# Patient Record
Sex: Male | Born: 1935 | Race: White | Hispanic: No | Marital: Married | Smoking: Former smoker
Health system: Southern US, Community
[De-identification: ages and names within clinical notes are randomized; demographics above are authoritative.]

## PROBLEM LIST (undated history)

## (undated) DIAGNOSIS — I4891 Unspecified atrial fibrillation: Secondary | ICD-10-CM

## (undated) DIAGNOSIS — J449 Chronic obstructive pulmonary disease, unspecified: Secondary | ICD-10-CM

## (undated) DIAGNOSIS — I509 Heart failure, unspecified: Secondary | ICD-10-CM

## (undated) DIAGNOSIS — I714 Abdominal aortic aneurysm, without rupture: Secondary | ICD-10-CM

## (undated) DIAGNOSIS — N400 Enlarged prostate without lower urinary tract symptoms: Secondary | ICD-10-CM

## (undated) DIAGNOSIS — M25551 Pain in right hip: Secondary | ICD-10-CM

## (undated) DIAGNOSIS — I10 Essential (primary) hypertension: Secondary | ICD-10-CM

## (undated) DIAGNOSIS — I219 Acute myocardial infarction, unspecified: Secondary | ICD-10-CM

## (undated) DIAGNOSIS — G8929 Other chronic pain: Secondary | ICD-10-CM

## (undated) DIAGNOSIS — Z7901 Long term (current) use of anticoagulants: Secondary | ICD-10-CM

## (undated) DIAGNOSIS — C801 Malignant (primary) neoplasm, unspecified: Secondary | ICD-10-CM

## (undated) DIAGNOSIS — E785 Hyperlipidemia, unspecified: Secondary | ICD-10-CM

## (undated) HISTORY — PX: ABDOMINAL AORTIC ANEURYSM REPAIR: SUR1152

## (undated) HISTORY — DX: Essential (primary) hypertension: I10

## (undated) HISTORY — DX: Malignant (primary) neoplasm, unspecified: C80.1

## (undated) HISTORY — DX: Long term (current) use of anticoagulants: Z79.01

## (undated) HISTORY — DX: Abdominal aortic aneurysm, without rupture: I71.4

## (undated) HISTORY — DX: Hyperlipidemia, unspecified: E78.5

## (undated) HISTORY — DX: Unspecified atrial fibrillation: I48.91

## (undated) HISTORY — PX: EYE SURGERY: SHX253

## (undated) HISTORY — DX: Acute myocardial infarction, unspecified: I21.9

## (undated) HISTORY — DX: Benign prostatic hyperplasia without lower urinary tract symptoms: N40.0

---
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
## 1991-05-26 DIAGNOSIS — I219 Acute myocardial infarction, unspecified: Secondary | ICD-10-CM

## 1991-05-26 HISTORY — DX: Acute myocardial infarction, unspecified: I21.9

## 2001-10-05 ENCOUNTER — Encounter: Admission: RE | Admit: 2001-10-05 | Discharge: 2001-10-05 | Payer: Self-pay | Admitting: Internal Medicine

## 2001-10-05 ENCOUNTER — Encounter: Payer: Self-pay | Admitting: Internal Medicine

## 2001-10-12 ENCOUNTER — Encounter: Payer: Self-pay | Admitting: *Deleted

## 2001-10-12 ENCOUNTER — Encounter: Admission: RE | Admit: 2001-10-12 | Discharge: 2001-10-12 | Payer: Self-pay | Admitting: *Deleted

## 2003-12-05 ENCOUNTER — Encounter: Admission: RE | Admit: 2003-12-05 | Discharge: 2003-12-05 | Payer: Self-pay | Admitting: Vascular Surgery

## 2004-07-02 ENCOUNTER — Encounter: Admission: RE | Admit: 2004-07-02 | Discharge: 2004-07-02 | Payer: Self-pay | Admitting: Vascular Surgery

## 2004-12-31 ENCOUNTER — Encounter: Admission: RE | Admit: 2004-12-31 | Discharge: 2004-12-31 | Payer: Self-pay | Admitting: Vascular Surgery

## 2005-07-15 ENCOUNTER — Encounter: Admission: RE | Admit: 2005-07-15 | Discharge: 2005-07-15 | Payer: Self-pay | Admitting: Vascular Surgery

## 2006-01-13 ENCOUNTER — Encounter: Admission: RE | Admit: 2006-01-13 | Discharge: 2006-01-13 | Payer: Self-pay | Admitting: Vascular Surgery

## 2006-07-07 ENCOUNTER — Encounter (INDEPENDENT_AMBULATORY_CARE_PROVIDER_SITE_OTHER): Payer: Self-pay | Admitting: Specialist

## 2006-07-07 ENCOUNTER — Ambulatory Visit (HOSPITAL_COMMUNITY): Admission: RE | Admit: 2006-07-07 | Discharge: 2006-07-07 | Payer: Self-pay | Admitting: General Surgery

## 2006-07-28 ENCOUNTER — Ambulatory Visit: Payer: Self-pay | Admitting: Vascular Surgery

## 2007-02-08 ENCOUNTER — Ambulatory Visit: Payer: Self-pay | Admitting: Vascular Surgery

## 2007-08-02 ENCOUNTER — Ambulatory Visit: Payer: Self-pay | Admitting: Vascular Surgery

## 2007-08-30 ENCOUNTER — Ambulatory Visit (HOSPITAL_COMMUNITY): Admission: RE | Admit: 2007-08-30 | Discharge: 2007-08-30 | Payer: Self-pay | Admitting: Internal Medicine

## 2007-12-02 ENCOUNTER — Encounter: Admission: RE | Admit: 2007-12-02 | Discharge: 2007-12-02 | Payer: Self-pay | Admitting: Internal Medicine

## 2007-12-25 ENCOUNTER — Emergency Department (HOSPITAL_COMMUNITY): Admission: EM | Admit: 2007-12-25 | Discharge: 2007-12-25 | Payer: Self-pay | Admitting: Emergency Medicine

## 2007-12-26 ENCOUNTER — Emergency Department (HOSPITAL_COMMUNITY): Admission: EM | Admit: 2007-12-26 | Discharge: 2007-12-26 | Payer: Self-pay | Admitting: Emergency Medicine

## 2008-01-24 ENCOUNTER — Ambulatory Visit: Payer: Self-pay | Admitting: Vascular Surgery

## 2008-09-04 ENCOUNTER — Ambulatory Visit: Payer: Self-pay | Admitting: Vascular Surgery

## 2009-02-12 ENCOUNTER — Ambulatory Visit: Payer: Self-pay | Admitting: Vascular Surgery

## 2009-08-22 ENCOUNTER — Ambulatory Visit: Payer: Self-pay | Admitting: Vascular Surgery

## 2009-08-26 ENCOUNTER — Ambulatory Visit (HOSPITAL_COMMUNITY): Admission: RE | Admit: 2009-08-26 | Discharge: 2009-08-26 | Payer: Self-pay | Admitting: Vascular Surgery

## 2009-08-26 ENCOUNTER — Ambulatory Visit: Payer: Self-pay | Admitting: Vascular Surgery

## 2009-08-30 ENCOUNTER — Encounter: Payer: Self-pay | Admitting: Cardiology

## 2009-09-05 ENCOUNTER — Encounter: Admission: RE | Admit: 2009-09-05 | Discharge: 2009-09-05 | Payer: Self-pay | Admitting: Vascular Surgery

## 2009-09-05 ENCOUNTER — Ambulatory Visit: Payer: Self-pay | Admitting: Vascular Surgery

## 2009-09-25 ENCOUNTER — Inpatient Hospital Stay (HOSPITAL_COMMUNITY): Admission: RE | Admit: 2009-09-25 | Discharge: 2009-10-01 | Payer: Self-pay | Admitting: Vascular Surgery

## 2009-09-25 ENCOUNTER — Ambulatory Visit: Payer: Self-pay | Admitting: Vascular Surgery

## 2009-09-25 DIAGNOSIS — I714 Abdominal aortic aneurysm, without rupture, unspecified: Secondary | ICD-10-CM

## 2009-09-25 HISTORY — DX: Abdominal aortic aneurysm, without rupture: I71.4

## 2009-09-25 HISTORY — DX: Abdominal aortic aneurysm, without rupture, unspecified: I71.40

## 2009-09-27 ENCOUNTER — Encounter: Payer: Self-pay | Admitting: Vascular Surgery

## 2009-10-24 ENCOUNTER — Ambulatory Visit: Payer: Self-pay | Admitting: Vascular Surgery

## 2009-10-27 ENCOUNTER — Emergency Department (HOSPITAL_COMMUNITY): Admission: EM | Admit: 2009-10-27 | Discharge: 2009-10-27 | Payer: Self-pay | Admitting: Emergency Medicine

## 2010-02-12 ENCOUNTER — Encounter: Admission: RE | Admit: 2010-02-12 | Discharge: 2010-02-12 | Payer: Self-pay | Admitting: Vascular Surgery

## 2010-02-12 ENCOUNTER — Ambulatory Visit: Payer: Self-pay | Admitting: Vascular Surgery

## 2010-02-19 ENCOUNTER — Encounter: Payer: Self-pay | Admitting: Cardiology

## 2010-02-19 ENCOUNTER — Ambulatory Visit: Payer: Self-pay | Admitting: Cardiology

## 2010-06-15 ENCOUNTER — Encounter: Payer: Self-pay | Admitting: Vascular Surgery

## 2010-08-11 LAB — URINALYSIS, ROUTINE W REFLEX MICROSCOPIC
Glucose, UA: NEGATIVE mg/dL
Specific Gravity, Urine: 1.022 (ref 1.005–1.030)
Urobilinogen, UA: 1 mg/dL (ref 0.0–1.0)

## 2010-08-11 LAB — CBC
MCV: 88.1 fL (ref 78.0–100.0)
Platelets: 86 10*3/uL — ABNORMAL LOW (ref 150–400)
RDW: 15.9 % — ABNORMAL HIGH (ref 11.5–15.5)
WBC: 3.9 10*3/uL — ABNORMAL LOW (ref 4.0–10.5)

## 2010-08-11 LAB — PROTIME-INR: INR: 2.55 — ABNORMAL HIGH (ref 0.00–1.49)

## 2010-08-11 LAB — URINE MICROSCOPIC-ADD ON

## 2010-08-12 LAB — CBC
HCT: 33.4 % — ABNORMAL LOW (ref 39.0–52.0)
HCT: 34.2 % — ABNORMAL LOW (ref 39.0–52.0)
HCT: 39.8 % (ref 39.0–52.0)
Hemoglobin: 11.1 g/dL — ABNORMAL LOW (ref 13.0–17.0)
Hemoglobin: 11.3 g/dL — ABNORMAL LOW (ref 13.0–17.0)
Hemoglobin: 13.6 g/dL (ref 13.0–17.0)
Hemoglobin: 13.6 g/dL (ref 13.0–17.0)
MCHC: 33 g/dL (ref 30.0–36.0)
MCHC: 34.3 g/dL (ref 30.0–36.0)
MCV: 88.6 fL (ref 78.0–100.0)
MCV: 89.4 fL (ref 78.0–100.0)
Platelets: 108 10*3/uL — ABNORMAL LOW (ref 150–400)
RBC: 3.77 MIL/uL — ABNORMAL LOW (ref 4.22–5.81)
RBC: 3.82 MIL/uL — ABNORMAL LOW (ref 4.22–5.81)
RBC: 4.49 MIL/uL (ref 4.22–5.81)
RBC: 4.52 MIL/uL (ref 4.22–5.81)
WBC: 11.7 10*3/uL — ABNORMAL HIGH (ref 4.0–10.5)
WBC: 7.2 10*3/uL (ref 4.0–10.5)

## 2010-08-12 LAB — BLOOD GAS, ARTERIAL
Acid-Base Excess: 2 mmol/L (ref 0.0–2.0)
Bicarbonate: 25.8 mEq/L — ABNORMAL HIGH (ref 20.0–24.0)
Drawn by: 206361
O2 Saturation: 97.8 %
pH, Arterial: 7.44 (ref 7.350–7.450)
pO2, Arterial: 99 mmHg (ref 80.0–100.0)

## 2010-08-12 LAB — BASIC METABOLIC PANEL
CO2: 27 mEq/L (ref 19–32)
CO2: 31 mEq/L (ref 19–32)
CO2: 32 mEq/L (ref 19–32)
Calcium: 8.5 mg/dL (ref 8.4–10.5)
Chloride: 100 mEq/L (ref 96–112)
Chloride: 104 mEq/L (ref 96–112)
Chloride: 99 mEq/L (ref 96–112)
Creatinine, Ser: 2.05 mg/dL — ABNORMAL HIGH (ref 0.4–1.5)
GFR calc Af Amer: 37 mL/min — ABNORMAL LOW (ref >60)
GFR calc Af Amer: >60 mL/min (ref >60)
GFR calc Af Amer: >60 mL/min (ref >60)
GFR calc non Af Amer: >60 mL/min (ref >60)
Glucose, Bld: 101 mg/dL — ABNORMAL HIGH (ref 70–99)
Glucose, Bld: 136 mg/dL — ABNORMAL HIGH (ref 70–99)
Potassium: 3.3 mEq/L — ABNORMAL LOW (ref 3.5–5.1)
Potassium: 3.4 mEq/L — ABNORMAL LOW (ref 3.5–5.1)
Potassium: 4.4 mEq/L (ref 3.5–5.1)
Sodium: 135 mEq/L (ref 135–145)
Sodium: 136 mEq/L (ref 135–145)
Sodium: 137 mEq/L (ref 135–145)

## 2010-08-12 LAB — COMPREHENSIVE METABOLIC PANEL
AST: 19 U/L (ref 0–37)
Albumin: 3.6 g/dL (ref 3.5–5.2)
Alkaline Phosphatase: 64 U/L (ref 39–117)
CO2: 26 mEq/L (ref 19–32)
Calcium: 9.5 mg/dL (ref 8.4–10.5)
Glucose, Bld: 126 mg/dL — ABNORMAL HIGH (ref 70–99)
Potassium: 4 mEq/L (ref 3.5–5.1)
Total Bilirubin: 0.9 mg/dL (ref 0.3–1.2)
Total Protein: 6.2 g/dL (ref 6.0–8.3)

## 2010-08-12 LAB — URINE CULTURE: Culture: NO GROWTH

## 2010-08-12 LAB — PROTIME-INR
INR: 1.11 (ref 0.00–1.49)
INR: 1.17 (ref 0.00–1.49)
INR: 1.18 (ref 0.00–1.49)
INR: 1.25 (ref 0.00–1.49)
Prothrombin Time: 14.2 seconds (ref 11.6–15.2)
Prothrombin Time: 14.9 seconds (ref 11.6–15.2)

## 2010-08-12 LAB — URINALYSIS, ROUTINE W REFLEX MICROSCOPIC
Bilirubin Urine: NEGATIVE
Glucose, UA: NEGATIVE mg/dL
Ketones, ur: 15 mg/dL — AB
Ketones, ur: 15 mg/dL — AB
Ketones, ur: NEGATIVE mg/dL
Nitrite: NEGATIVE
Nitrite: POSITIVE — AB
Nitrite: POSITIVE — AB
Protein, ur: 100 mg/dL — AB
Protein, ur: NEGATIVE mg/dL
Specific Gravity, Urine: 1.017 (ref 1.005–1.030)
Urobilinogen, UA: 1 mg/dL (ref 0.0–1.0)
Urobilinogen, UA: 1 mg/dL (ref 0.0–1.0)
pH: 5 (ref 5.0–8.0)

## 2010-08-12 LAB — TYPE AND SCREEN

## 2010-08-12 LAB — APTT: aPTT: 32 seconds (ref 24–37)

## 2010-08-12 LAB — URINE MICROSCOPIC-ADD ON

## 2010-08-13 ENCOUNTER — Ambulatory Visit: Payer: Self-pay | Admitting: Vascular Surgery

## 2010-08-13 LAB — POCT I-STAT, CHEM 8
BUN: 20 mg/dL (ref 6–23)
Chloride: 107 mEq/L (ref 96–112)
Sodium: 141 mEq/L (ref 135–145)
TCO2: 27 mmol/L (ref 0–100)

## 2010-08-13 LAB — APTT: aPTT: 30 seconds (ref 24–37)

## 2010-09-10 ENCOUNTER — Ambulatory Visit (INDEPENDENT_AMBULATORY_CARE_PROVIDER_SITE_OTHER): Payer: Medicare Other | Admitting: Vascular Surgery

## 2010-09-10 ENCOUNTER — Encounter (INDEPENDENT_AMBULATORY_CARE_PROVIDER_SITE_OTHER): Payer: Medicare Other

## 2010-09-10 DIAGNOSIS — I714 Abdominal aortic aneurysm, without rupture: Secondary | ICD-10-CM

## 2010-09-10 DIAGNOSIS — Z48812 Encounter for surgical aftercare following surgery on the circulatory system: Secondary | ICD-10-CM

## 2010-09-11 NOTE — Assessment & Plan Note (Signed)
OFFICE VISIT  Chad Buchanan, Chad Buchanan DOB:  12/01/1920                                       09/10/2010 EAVWU#:[id number]  I saw the patient in the office for continued follow-up after endovascular repair of his abdominal aortic aneurysm in May 2011.  This is a pleasant 77 year old gentleman who had a 5.6-cm infrarenal aneurysm and underwent repair with a Gore excluder graft in May 2011.  He comes in for a 27-month follow-up visit.  Since I saw him last, he has had no abdominal or back pain.  His main complaint has been continued bilateral lower extremity swelling, which has been chronic.  PAST MEDICAL HISTORY:  Significant for hypertension which has been stable on his current medications.  He also had a previous myocardial infarction in the early 1980s but he has had no recent cardiac problems.  SOCIAL HISTORY:  He is married.  He has 4 children.  He quit tobacco in 1984.  REVIEW OF SYSTEMS:  CARDIOVASCULAR:  He does admit to dyspnea on exertion.  He has had no chest pain, chest pressure or palpitations. PULMONARY:  He has had no productive cough, bronchitis, asthma or wheezing.  PHYSICAL EXAMINATION:  This is a pleasant 77 year old gentleman who appears his stated age.  Blood pressure is 169/95, heart rate is 66, respiratory rate is 14.  HEENT:  Unremarkable.  Lungs are clear bilaterally to auscultation without rales, rhonchi or wheezing.  On cardiovascular exam I do not detect any carotid bruits.  He has a regular rate and rhythm.  He has palpable femoral pulses.  I cannot palpate pedal pulses because of his leg swelling; however, both feet are warm and well-perfused.  He has moderate bilateral lower extremity leg swelling.  Abdomen:  Soft and nontender with normal-pitched bowel sounds.  Neurologic exam:  There is no focal weakness or paresthesias.  I have independently interpreted his duplex scan of his aorta, which shows the maximum diameter is 5.3 cm and  this has not changed compared to his CT scan from February 12, 2010.  Noted to have triphasic wave forms in the right and left limbs of his graft.  We have been alternating ultrasound with CT scan, so I have ordered follow-up CT scan in 6 months to continue to follow the stent graft.  I will see him back at that time.  He knows to call sooner if he has problems.  With respect to his leg swelling, I have encouraged to elevate his legs.  He does not like wearing compression stockings so has not been compliant with this.    Di Kindle. Edilia Bo, M.D. Electronically Signed  CSD/MEDQ  D:  09/10/2010  T:  09/11/2010  Job:  [id number]  cc:   Cassell Clement, M.D. Loraine Leriche A. Perini, M.D.

## 2010-09-12 ENCOUNTER — Other Ambulatory Visit: Payer: Self-pay | Admitting: Vascular Surgery

## 2010-09-12 DIAGNOSIS — I714 Abdominal aortic aneurysm, without rupture: Secondary | ICD-10-CM

## 2010-09-17 NOTE — Procedures (Unsigned)
VASCULAR LAB EXAM  INDICATION:  Followup abdominal aortic aneurysm Endograft placed 09/25/2009.  HISTORY: Diabetes:  No. Cardiac:  No. Hypertension:  Yes.  EXAM:  Abdominal aortic aneurysm sac size 5.3 cm AP, NV cm transverse.  Previous sac size 02/12/2010 by CT 5.3 cm AP.  IMPRESSION: 1. The aorta and Endograft appear patent with triphasic waveforms in     the right and left limbs of the graft. 2. No significant change in size of the aneurysm sac surrounding the     Endograft. 3. No evidence of endoleak was detected.  ___________________________________________ Di Kindle. Edilia Bo, M.D.  LT/MEDQ  D:  09/10/2010  T:  09/10/2010  Job:  [id number]

## 2010-10-07 NOTE — Procedures (Signed)
DUPLEX ULTRASOUND OF ABDOMINAL AORTA   INDICATION:  Abdominal aortic aneurysm.   HISTORY:  Diabetes:  No.  Cardiac:  No.  Hypertension:  Yes.  Smoking:  Previous.  Connective Tissue Disorder:  Family History:  No.  Previous Surgery:  No.   DUPLEX EXAM:         AP (cm)                   TRANSVERSE (cm)  Proximal             2.9 cm                    2.98 cm  Mid                  5.6 cm                    5.1 cm  Distal               Not visualized            Not visualized  Right Iliac          Not visualized            Not visualized  Left Iliac           Not visualized            Not visualized   PREVIOUS:  Date: 02/12/2009  AP:  4.9  TRANSVERSE:  4.9   IMPRESSION:  1. Aneurysm of the mid abdominal aorta with a significant increase in      the maximum diameter when compared to the previous examination.  2. Unable to adequately visualize the distal abdominal aorta and      bilateral common iliac arteries due to overlying bowel gas patterns      and patient body habitus.   ___________________________________________  Di Kindle. Edilia Bo, M.D.   CH/MEDQ  D:  08/22/2009  T:  08/23/2009  Job:  [id number]

## 2010-10-07 NOTE — Assessment & Plan Note (Signed)
OFFICE VISIT   Chad Buchanan, Chad Buchanan  DOB:  03-15-1921                                       08/22/2009  [id number]#:[id number]   I saw the patient in the office today for continued followup of his  abdominal aortic aneurysm.  He was last seen in September 2010 at which  time his aneurysm measured 4.9 cm in maximum diameter.  He came in for  routine duplex today and the aneurysm and increased in size to 5.6 cm.  Of note, he does have a history of some chronic low back pain but has  had no recent change in the character of his back pain.  He has had this  pain for years.  The pain is located in his lower back and is aggravated  by activity and relieved with rest.  There has been no significant  change in his symptoms over the last 6 months.  There are no other  aggravating or alleviating factors.   PAST MEDICAL HISTORY:  Significant for moderate obesity, hypertension  and hypercholesterolemia.  He also has a history of atrial fibrillation  and is on Coumadin.  He denies any history of diabetes, history of  previous myocardial infarction, history of congestive heart failure or  history of COPD.   FAMILY HISTORY:  There is no history of premature cardiovascular disease  and no history of aneurysmal disease that he is aware of.   SOCIAL HISTORY:  He is married.  He has 4 children of his own, and his  second wife has 6 children.  He quit tobacco in 1983.   REVIEW OF SYSTEMS:  GENERAL:  He has had no recent weight loss, weight  gain or problems with his appetite.  CARDIOVASCULAR:  He had no chest pain, chest pressure, palpitations or  arrhythmias.  He does admit to dyspnea on exertion.  He also has a  history of atrial fibrillation.  He has had no claudication, rest pain  or nonhealing ulcers.  He has had no history of DVT or phlebitis.  He  has had no history of stroke, TIAs or amaurosis fugax.  PULMONARY:  He has had no productive cough, bronchitis, asthma or  wheezing.  GI, GU, NEUROLOGIC, MUSCULOSKELETAL, PSYCHIATRIC, ENT, HEMATOLOGIC  review of systems is unremarkable and is documented on the medical  history form in his chart.   PHYSICAL EXAMINATION:  This is a pleasant 77 year old gentleman who  appears his stated age.  Blood pressure is 150/98, heart rate is 52,  saturation is 96% on room air.  HEENT:  Unremarkable.  Lungs:  Clear  bilaterally to auscultation without rales, rhonchi or wheezing.  Cardiovascular:  I do not detect any carotid bruits.  He has an  irregular rhythm.  He has mild peripheral edema bilaterally.  He has  palpable femoral pulses palpable posterior tibial pulses bilaterally.  Abdomen:  Soft, nontender.  Because of his size, I cannot palpate his  aneurysm.  He has normal-pitched bowel sounds.  No masses are  appreciated.  Musculoskeletal:  There are no major deformities or  cyanosis.  There is no evidence of atheroembolic disease.  Neurologic:  He has no focal weakness or paresthesias.  Skin:  There are no ulcers or  rashes.   I did independently interpret his duplex of his abdominal aortic  aneurysm, and the maximum diameter is  5.6 cm which has increased  significantly compared to the study in September when it was 4.9 cm.   Given the increase in size of the aneurysm, his risk of rupture is  approximately 5% to 10% per year.  I have recommended consideration for  elective repair.  We will have him see Dr. Patty Sermons who is his  cardiologist at [facility] for preoperative cardiac  evaluation.  In addition, we will schedule him for arteriography and CT  scan to evaluate him for possible stent graft repair of his aneurysm.  Certainly given his age and weight, it would be best to be able to do a  stent graft repair if at all possible.  Will make further  recommendations pending results of his workup.  We have discussed the  indications for arteriography and the potential complications including  but not  limited to bleeding, arterial injury and renal insufficiency.  All of his questions were answered, and he is agreeable to proceed.  His  study has been scheduled for August 26, 2009.  We did instruct him to  discontinue his Coumadin today prior to his arteriogram and will restart  this after the procedure.     Di Kindle. Edilia Bo, M.D.  Electronically Signed   CSD/MEDQ  D:  08/22/2009  T:  08/23/2009  Job:  [id number]   cc:   Loraine Leriche A. Perini, M.D.  Cassell Clement, M.D.

## 2010-10-07 NOTE — Assessment & Plan Note (Signed)
OFFICE VISIT   Chad Buchanan, Chad Buchanan  DOB:  [birth year]                                       10/24/2009  NUUVO#:[id number]   I saw the patient in the office today for followup after recent  endovascular repair of his abdominal aortic aneurysm.  This is an 77-  year-old gentleman I have been following with an aneurysm which had  enlarged from 4.9 cm to 5.6 cm over 6 months.  Given the enlargement of  the aneurysm and its size elective repair was recommended.  Fortunately  he was a reasonable candidate for endovascular repair.  He underwent  endovascular repair on 09/25/2009 and did well postoperatively except  for some problems with urinary retention.  He is now being worked up by  the urologist and is currently followed by Dr. Vernie Ammons.  He still has a  Foley catheter in place.  He has had no abdominal or back pain.   PHYSICAL EXAMINATION:  On examination blood pressure is 133/86, heart  rate is 84, temperature 97.8.  His incisions are healing nicely.  He has  palpable femoral pulses and warm and well-perfused feet.  Abdomen is  soft and nontender.  I cannot palpate his aneurysm.   He did have his followup CT scan while he was in the hospital and the  graft appears to be in good position and I do not see any evidence of  endo leak.   Overall I am pleased with his progress and I plan on seeing him back in  3 months with a followup CT scan.  He knows to call sooner if he has  problems.     Di Kindle. Edilia Bo, M.D.  Electronically Signed   CSD/MEDQ  D:  10/24/2009  T:  10/25/2009  Job:  [id number]   cc:   Loraine Leriche A. Perini, M.D.  Cassell Clement, M.D.

## 2010-10-07 NOTE — Procedures (Signed)
DUPLEX ULTRASOUND OF ABDOMINAL AORTA   INDICATION:  Followup, abdominal aortic aneurysm.   HISTORY:  Diabetes:  No.  Cardiac:  No.  Hypertension:  Yes.  Smoking:  Quit in 1993.  Connective Tissue Disorder:  Family History:  Previous Surgery:   DUPLEX EXAM:         AP (cm)                   TRANSVERSE (cm)  Proximal             3.31 cm                   3.33 cm  Mid                  5.09 cm                   5.01 cm  Distal               2.83 cm                   2.56 cm  Right Iliac          1.50 cm                   1.50 cm  Left Iliac           1.73 cm                   1.69 cm   PREVIOUS:  Date:  AP:  4.95  TRANSVERSE:  4.84   IMPRESSION:  Abdominal aortic aneurysm noted with the largest  measurement of 5.09 cm X 5.01 cm.   ___________________________________________  Di Kindle. Edilia Bo, M.D.   MG/MEDQ  D:  08/02/2007  T:  08/02/2007  Job:  [id number]

## 2010-10-07 NOTE — Assessment & Plan Note (Signed)
OFFICE VISIT   GEOGE, LAWRANCE  DOB:  09-Jan-1921                                       09/05/2009  [id number]#:[id number]   I saw the patient in the office today for followup after his arteriogram  and CT scan to further discuss repair of his abdominal aortic aneurysm.  He had been seen in September of 2010 when the aneurysm measured 4.9 cm  in maximum diameter.  On a routine duplex in the office on 08/22/2009  the aneurysm had enlarged to 5.6 cm.  Given the enlargement of the  aneurysm to now greater than 5.5 cm with a 7 mm enlargement over 6  months I thought that we should consider elective repair.  The patient  has had no new abdominal or back pain.  He does have some chronic low  back pain which has not changed in character.   PAST MEDICAL HISTORY:  Significant for obesity, hypertension,  hypercholesterolemia and atrial fibrillation.  He is on Coumadin.   REVIEW OF SYSTEMS:  He has had no recent chest pain, chest pressure,  palpitations or arrhythmias.  He does admit to some dyspnea on exertion.  He denies significant orthopnea.  He has had no claudication or rest  pain.  He has had no history of stroke.   PHYSICAL EXAMINATION:  General:  This is a pleasant 77 year old  gentleman who appears his stated age.  He is moderately obese.  Vital  signs:  Blood pressure is 137/88, heart rate is 57, saturation 94%.  Lungs:  Are clear bilaterally to auscultation without rales, rhonchi or  wheezing.  Cardiovascular:  I do not detect any carotid bruits.  He has  an irregular rhythm.  He has palpable femoral and pedal pulses.  There  is no evidence of atheroembolic disease.   I have reviewed his arteriogram which shows some slight tortuosity at  the neck of his aneurysm.  On his arteriogram I measure this at  approximately 40 degrees.  There is also some slight ectasia of the left  common iliac artery with some angulation of the left common iliac  artery.  Except  for these two issues he appears to be a good candidate  for endovascular repair of his aneurysm.  I have reviewed the CT scan,  which again showed some tortuosity at the proximal neck and also some  ectasia of the left common iliac artery.  The aneurysm measures 5.6 cm  in maximum diameter.   I will review the films with Karren Cobble, the [employer] rep, but I think he  appears to be a reasonable candidate for endovascular repair of his  aneurysm.  Certainly given his age and obesity this would be ideal as I  think there would be increased risk with open aneurysm repair especially  from a pulmonary standpoint.  Once I have had ad chance to review the  films we will plan on scheduling elective repair of his aneurysm.  We  will have to obviously stop his Coumadin preoperatively.     Di Kindle. Edilia Bo, M.D.  Electronically Signed   CSD/MEDQ  D:  09/05/2009  T:  09/06/2009  Job:  [id number]   cc:   Loraine Leriche A. Perini, M.D.  Cassell Clement, M.D.

## 2010-10-07 NOTE — Assessment & Plan Note (Signed)
OFFICE VISIT   Chad Buchanan, Chad Buchanan  DOB:  [birth year]                                       02/12/2010  [id number]#:[id number]   I saw the patient in the office today for continued follow-up after  endovascular repair of his abdominal aortic aneurysm on 09/25/2009. This  is a pleasant 77 year old gentleman whose aneurysm enlarged to 5.6 cm.  Fortunately he was a candidate for endovascular repair and underwent  placement of a Gore excluder stent graft with three components on  09/25/2009.  He comes in for a 41-month follow-up visit.  He had a CT  scan today.  Since I saw him last, he has had no abdominal pain or back  pain.  He has noted some swelling that occurs intermittently in the left  groin.  He had no fever or chills.   SOCIAL HISTORY:  He is married.  Has 4 children.  Quit tobacco in 1993.   REVIEW OF SYSTEMS:  CARDIOVASCULAR:  He has had no chest pain, chest  pressure, palpitations or arrhythmia arrhythmias.  He does admit to  dyspnea on exertion.  He has had no claudication, rest pain or  nonhealing ulcers.  PULMONARY:  He has had no productive cough bronchitis, asthma or  wheezing.   PHYSICAL EXAMINATION:  This is a pleasant 77 year old gentleman who  appears his stated age.  His blood pressure is 143/79, heart rate is 43,  respiratory rate is 16.  Lungs:  Clear bilaterally to auscultation with  slightly decreased breath sounds at the bases.  Cardiovascular:  I do  not detect any carotid bruits.  He has a regular rate and rhythm.  He  has palpable femoral pulses.  He has moderate 2+ peripheral edema  bilaterally.  Abdomen:  Soft and nontender.  Musculoskeletal:  No major  deformities or cyanosis.  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   His CT scan has not yet been interpreted by the radiologist but by my  independent interpretation, the aneurysm has decreased in size now to  5.3 cm.  Preoperatively the  aneurysm measured 5.6 cm in maximum  diameter.  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  I do not see any evidence of endoleak.   Overall, I am pleased his progress.  I have ordered a duplex scan of the  aneurysm in 6 months and I will see him back at that time.  However,  currently the stent graft appears to be excellent position and the  aneurysm appears to have decreased in size slightly.  With respect to  his leg swelling, we have instructed him on the proper position for  elevating his legs.  In addition, he is being treated for congestive  heart failure.     Di Kindle. Edilia Bo, M.D.  Electronically Signed   CSD/MEDQ  D:  02/12/2010  T:  02/13/2010  Job:  [id number]   cc:   Cassell Clement, M.D.  Loraine Leriche A. Perini, M.D.

## 2010-10-07 NOTE — Procedures (Signed)
DUPLEX ULTRASOUND OF ABDOMINAL AORTA   INDICATION:  Abdominal aortic aneurysm.   HISTORY:  Diabetes:  No  Cardiac:  No  Hypertension:  Yes  Smoking:  Previous  Connective Tissue Disorder:  Family History:  No  Previous Surgery:  No   DUPLEX EXAM:         AP (cm)                   TRANSVERSE (cm)  Proximal             3.1 cm                    3.1 cm  Mid                  2.6 cm                    3.2 cm  Distal               4.7 cm                    4.8 cm  Right Iliac          Not visualized            Not visualized  Left Iliac           Not visualized            Not visualized   PREVIOUS:  Date: 01/24/2008  AP:  5.13  TRANSVERSE:  5.09   IMPRESSION:  Aneurysm of the distal abdominal aorta with no significant  change in the maximum diameter noted when compared to the previous exam.    ___________________________________________  Di Kindle. Edilia Bo, M.D.   CH/MEDQ  D:  09/04/2008  T:  09/04/2008  Job:  [id number]

## 2010-10-07 NOTE — Assessment & Plan Note (Signed)
OFFICE VISIT   SALIF, TAY  DOB:  Feb 25, 1921                                       08/02/2007  ZOXWR#:[id number]   I saw the patient in the office today for continued followup of his  abdominal aortic aneurysm.  This is a pleasant 77 year old gentleman  whom I have been following since 2003 with an infrarenal abdominal  aortic aneurysm.  He comes in for a 59-month followup with duplex scan.  Since I saw him last, he has had no abdominal or back pain.  He remains  very active.   He has had no significant change in his medical history.   REVIEW OF SYSTEMS:  He has had no recent chest pain, chest pressure,  palpitations, or arrhythmias.  Has had no bronchitis, asthma, or wheezing.   PHYSICAL EXAMINATION:  This is a pleasant 78 year old gentleman who  appears his stated age.  Blood pressure is 129/74, heart rate is 51.  I  do not detect any carotid bruits.  Lungs are clear bilaterally to  auscultation.  On cardiac exam, he has a regular rate and rhythm.  His  abdomen is obese and difficult to assess.  I cannot palpate his  aneurysm.  He has palpable femoral, popliteal, and pedal pulses  bilaterally.   Duplex scan in our office today shows the maximum diameter of his  aneurysm is 5.0 cm.  This has not changed significantly compared to the  study 6 months ago when it was 4.95.  He understands, in general, we  would not consider elective repair, unless the aneurysm reached 5.5 cm  in maximum diameter.  I plan on seeing him back in 6 months for a  followup duplex scan.  Unfortunately, he is not a smoker and his blood  pressure is well-controlled.  He knows to call sooner if he has  problems.   Di Kindle. Edilia Bo, M.D.  Electronically Signed   CSD/MEDQ  D:  08/02/2007  T:  08/03/2007  Job:  [id number]

## 2010-10-07 NOTE — Procedures (Signed)
DUPLEX ULTRASOUND OF ABDOMINAL AORTA   INDICATION:  Follow up, abdominal aortic aneurysm.   HISTORY:  Diabetes:  No  Cardiac:  No  Hypertension:  Yes  Smoking:  Quit in 1993  Connective Tissue Disorder:  Family History:  No  Previous Surgery:   DUPLEX EXAM:         AP (cm)                   TRANSVERSE (cm)  Proximal             2.38 cm                   2.38 cm  Mid                  4.95 cm                   4.84 cm  Distal               3.42 cm                   3.5 cm  Right Iliac          Unable to visualize       Unable to visualize  Left Iliac           Unable to visualize       Unable to visualize   PREVIOUS:  Date: 07/28/2006  AP:  4.99  TRANSVERSE:  4.88   IMPRESSION:  Stable measurements of known abdominal aortic aneurysm.   ___________________________________________  Di Kindle. Edilia Bo, M.D.   DP/MEDQ  D:  02/08/2007  T:  02/08/2007  Job:  [id number]

## 2010-10-07 NOTE — Procedures (Signed)
DUPLEX ULTRASOUND OF ABDOMINAL AORTA   INDICATION:  Follow up abdominal aortic aneurysm.   HISTORY:  Diabetes:  No.  Cardiac:  No.  Hypertension:  Yes.  Smoking:  Quit.  Connective Tissue Disorder:  Family History:  No.  Previous Surgery:  No.   DUPLEX EXAM:         AP (cm)                   TRANSVERSE (cm)  Proximal             3.28 Cm                   3.40 cm  Mid                  5.13 cm                   5.09 cm  Distal               3.10 cm                   3.27 cm  Right Iliac          1.96 cm                   1.83 cm  Left Iliac           1.70 cm                   1.96 cm   PREVIOUS:  Date: 07/24/07  AP:  5.09  TRANSVERSE:  5.01   IMPRESSION:  Stable abdominal aortic aneurysm with the largest  measurement of 5.13 cm X 5.09 cm.   ___________________________________________  Di Kindle. Edilia Bo, M.D.   AS/MEDQ  D:  01/24/2008  T:  01/24/2008  Job:  [id number]

## 2010-10-07 NOTE — Procedures (Signed)
DUPLEX ULTRASOUND OF ABDOMINAL AORTA   INDICATION:  Abdominal aortic aneurysm.   HISTORY:  Diabetes:  No.  Cardiac:  No.  Hypertension:  Yes.  Smoking:  Previous.  Connective Tissue Disorder:  Family History:  No.  Previous Surgery:  No.   DUPLEX EXAM:         AP (cm)                   TRANSVERSE (cm)  Proximal             3.0 cm                    2.9 cm  Mid                  2.7 cm                    2.7 cm  Distal               4.9 cm                    4.9 cm  Right Iliac          Not visualized            Not visualized  Left Iliac           Not visualized            Not visualized   PREVIOUS:  Date: 09/04/2008  AP:  4.7  TRANSVERSE:  4.8   IMPRESSION:  1. Stable aneurysm measurement of the distal abdominal aorta noted      when compared to the previous exam.  2. Unable to adequately visualize the bilateral common iliac arteries      due to overlying bowel gas and patient body habitus.   ___________________________________________  Di Kindle. Edilia Bo, M.D.   CH/MEDQ  D:  02/12/2009  T:  02/12/2009  Job:  [id number]

## 2010-10-10 NOTE — Op Note (Signed)
[name], Chad Buchanan                ACCOUNT NO.:  [id number]   MEDICAL RECORD NO.:  [id number]          PATIENT TYPE:  AMB   LOCATION:  DAY                          FACILITY:  [facility]   PHYSICIAN:  Ollen Gross. Vernell Morgans, M.D. DATE OF BIRTH:  1920/10/24   DATE OF PROCEDURE:  07/07/2006  DATE OF DISCHARGE:                               OPERATIVE REPORT   PREOPERATIVE DIAGNOSIS:  Large internal and external hemorrhoids.   POSTOPERATIVE DIAGNOSIS:  Large internal and external hemorrhoids.   PROCEDURE:  Hemorrhoidectomy.   SURGEON:  Ollen Gross. Vernell Morgans, M.D.   ANESTHESIA:  General endotracheal.   PROCEDURE:  After informed consent was obtained, the patient was brought  to the operating room and placed in the supine position on the operating  room table.  After adequate induction of general anesthesia, the patient  was moved into a lithotomy position.  His perirectal area was then  prepped with Betadine and draped in the usual sterile manner.  In the  right anterior position, the patient had a very large internal and  external hemorrhoid.  A 2-0 chromic stitch was placed at the apex of the  hemorrhoid inside the rectum.  The hemorrhoid was then grasped with  Allis clamps, and the base of the hemorrhoid was scored with a 15 blade  knife.  A hemostat was then placed across the base of the hemorrhoid  along the area that was scored.  The hemorrhoid was then excised with  tenotomy scissors.  The 2-0 chromic stitch was then run over the  hemostat, reapproximating the mucosa and skin.  Once the stitch was  nearly outside of the rectum.  The hemostat was removed, and a running  stitch was cinched down.  There was good apposition of the tissues.  The  last few millimeters of the incision were left open, and the stitch was  tied down.  There was good hemostasis.  Pressure was held to the area  for several minutes, and the area was watched closely.  No other  bleeding was noted.  The rest of the rectum  was examined, and the  patient had some other smaller internal and external hemorrhoids, but  these were very small and no acute-appearing.  Posteriorly, the patient  appeared to have a fairly large area of a chronic fissure that had  epithelialized and healed, but through the thin layer of epithelium, you  could see the internal sphincter.  Again, this did not appear to be  acute but more chronic and was left alone.  Prior to starting all this,  the perirectal area was infiltrated with 0.25% Marcaine and 1 cc of  Wydase, and the tissue was massaged gently for several minutes.  Once  this was accomplished, the incision looked good with no bleeding.  The  tissue was healthy.  The hemorrhoid was removed and sent to pathology.  The rectum was then filled with 1% lidocaine jelly and a small piece of  Gelfoam and sterile dressings were applied.  The patient tolerated the  procedure well.  At the end of the case all  needle, sponge, and  instrument counts were then correct.  Patient was then awakened and  taken to the recovery room in stable condition.      Ollen Gross. Vernell Morgans, M.D.  Electronically Signed    PST/MEDQ  D:  07/07/2006  T:  07/07/2006  Job:  [id number]

## 2011-01-20 ENCOUNTER — Emergency Department (HOSPITAL_COMMUNITY): Payer: Medicare Other

## 2011-01-20 ENCOUNTER — Emergency Department (HOSPITAL_COMMUNITY)
Admission: EM | Admit: 2011-01-20 | Discharge: 2011-01-20 | Disposition: A | Discharge status: Home / Self Care | Payer: Medicare Other | Attending: Emergency Medicine | Admitting: Emergency Medicine

## 2011-01-20 DIAGNOSIS — R5381 Other malaise: Secondary | ICD-10-CM | POA: Insufficient documentation

## 2011-01-20 DIAGNOSIS — Z7901 Long term (current) use of anticoagulants: Secondary | ICD-10-CM | POA: Insufficient documentation

## 2011-01-20 DIAGNOSIS — I4891 Unspecified atrial fibrillation: Secondary | ICD-10-CM | POA: Insufficient documentation

## 2011-01-20 DIAGNOSIS — R269 Unspecified abnormalities of gait and mobility: Secondary | ICD-10-CM | POA: Insufficient documentation

## 2011-01-20 DIAGNOSIS — R42 Dizziness and giddiness: Secondary | ICD-10-CM | POA: Insufficient documentation

## 2011-01-20 DIAGNOSIS — I10 Essential (primary) hypertension: Secondary | ICD-10-CM | POA: Insufficient documentation

## 2011-01-20 LAB — BASIC METABOLIC PANEL
CO2: 29 mEq/L (ref 19–32)
Chloride: 103 mEq/L (ref 96–112)
Creatinine, Ser: 0.93 mg/dL (ref 0.50–1.35)
GFR calc Af Amer: >60 mL/min (ref >60)
Potassium: 4.1 mEq/L (ref 3.5–5.1)

## 2011-01-20 LAB — URINALYSIS, ROUTINE W REFLEX MICROSCOPIC
Bilirubin Urine: NEGATIVE
Hgb urine dipstick: NEGATIVE
Protein, ur: NEGATIVE mg/dL
Urobilinogen, UA: 0.2 mg/dL (ref 0.0–1.0)

## 2011-01-20 LAB — CBC
HCT: 39.8 % (ref 39.0–52.0)
MCV: 86.3 fL (ref 78.0–100.0)
RBC: 4.61 MIL/uL (ref 4.22–5.81)
WBC: 6.4 10*3/uL (ref 4.0–10.5)

## 2011-01-20 LAB — DIFFERENTIAL
Basophils Relative: 0 % (ref 0–1)
Eosinophils Relative: 4 % (ref 0–5)
Lymphocytes Relative: 9 % — ABNORMAL LOW (ref 12–46)
Neutrophils Relative %: 80 % — ABNORMAL HIGH (ref 43–77)

## 2011-01-20 LAB — POCT I-STAT TROPONIN I: Troponin i, poc: 0.01 ng/mL (ref 0.00–0.08)

## 2011-01-29 ENCOUNTER — Ambulatory Visit (INDEPENDENT_AMBULATORY_CARE_PROVIDER_SITE_OTHER): Payer: Medicare Other | Admitting: *Deleted

## 2011-01-29 DIAGNOSIS — G459 Transient cerebral ischemic attack, unspecified: Secondary | ICD-10-CM

## 2011-02-03 ENCOUNTER — Encounter: Payer: Self-pay | Admitting: Vascular Surgery

## 2011-02-04 NOTE — Procedures (Unsigned)
CAROTID DUPLEX EXAM  INDICATION:  Rule out TIA.  HISTORY: Diabetes:  No. Cardiac:  No. Hypertension:  Yes. Smoking:  Previously. Previous Surgery:  No. CV History:  Recent episode of dizziness with cold sweats and increased blood pressure. Amaurosis Fugax No, Paresthesias No, Hemiparesis No.                                      RIGHT             LEFT Brachial systolic pressure:         143               154 Brachial Doppler waveforms:         Normal            Normal Vertebral direction of flow:        Antegrade         Antegrade DUPLEX VELOCITIES (cm/sec) CCA peak systolic                   30                42 ECA peak systolic                   74                62 ICA peak systolic                   63                68 ICA end diastolic                   17                22 PLAQUE MORPHOLOGY:                  Calcific          Calcific PLAQUE AMOUNT:                      Minimal           Minimal PLAQUE LOCATION:                    Bifurcation       Bifurcation  IMPRESSION: 1. Bilateral internal carotid artery velocities are suggestive of a 1%     to 39% stenosis. 2. Velocities may be under-estimated due to calcific plaque with     acoustic shadowing. 3. Antegrade vertebral arteries bilaterally.  ___________________________________________ Di Kindle. Edilia Bo, M.D.  EM/MEDQ  D:  01/29/2011  T:  01/29/2011  Job:  [id number]

## 2011-02-20 LAB — URINALYSIS, ROUTINE W REFLEX MICROSCOPIC
Bilirubin Urine: NEGATIVE
Glucose, UA: NEGATIVE
Glucose, UA: NEGATIVE
Ketones, ur: NEGATIVE
Leukocytes, UA: NEGATIVE
Nitrite: NEGATIVE
Protein, ur: NEGATIVE
Protein, ur: NEGATIVE
Specific Gravity, Urine: 1.012
Urobilinogen, UA: 0.2
pH: 6
pH: 6.5

## 2011-02-20 LAB — URINE MICROSCOPIC-ADD ON

## 2011-02-20 LAB — URINE CULTURE: Colony Count: NO GROWTH

## 2011-03-02 ENCOUNTER — Other Ambulatory Visit: Payer: Self-pay | Admitting: Cardiology

## 2011-03-13 ENCOUNTER — Other Ambulatory Visit: Payer: Self-pay | Admitting: Vascular Surgery

## 2011-03-13 LAB — CREATININE, SERUM: Creat: 0.97 mg/dL (ref 0.50–1.35)

## 2011-03-13 LAB — BUN: BUN: 19 mg/dL (ref 6–23)

## 2011-03-18 ENCOUNTER — Ambulatory Visit: Payer: Medicare Other | Admitting: Vascular Surgery

## 2011-03-24 ENCOUNTER — Encounter: Payer: Self-pay | Admitting: Vascular Surgery

## 2011-03-25 ENCOUNTER — Ambulatory Visit (INDEPENDENT_AMBULATORY_CARE_PROVIDER_SITE_OTHER): Payer: Medicare Other | Admitting: Vascular Surgery

## 2011-03-25 ENCOUNTER — Ambulatory Visit
Admission: RE | Admit: 2011-03-25 | Discharge: 2011-03-25 | Disposition: A | Discharge status: Home / Self Care | Payer: Medicare Other | Source: Ambulatory Visit | Attending: Vascular Surgery | Admitting: Vascular Surgery

## 2011-03-25 ENCOUNTER — Encounter: Payer: Self-pay | Admitting: Vascular Surgery

## 2011-03-25 VITALS — BP 160/94 | HR 48 | Ht 73.2 in | Wt 242.5 lb

## 2011-03-25 DIAGNOSIS — I714 Abdominal aortic aneurysm, without rupture, unspecified: Secondary | ICD-10-CM | POA: Insufficient documentation

## 2011-03-25 MED ORDER — IOHEXOL 300 MG/ML  SOLN
125.0000 mL | Freq: Once | INTRAMUSCULAR | Status: AC | PRN
  Administered 2011-03-25: 125 mL via INTRAVENOUS

## 2011-03-25 NOTE — Progress Notes (Signed)
[facility] [place]  Patient name: Chad Buchanan MRN: [id number] DOB: 1921/04/13 Sex: male  CC: followup after EVAR.  HPI: TERRALL BLEY is a 77 y.o. male who underwent repair of a 5.6 cm infrarenal abdominal aortic aneurysm with an endovascular stent graft on 09/25/2009. As was a Engineer, technical sales. He has had no abdominal or back pain. He did fall recently and injured his hip. His hip pain is gradually improving. He has hypertension and hyperlipidemia have been stable on his current medications. He has atrial fibrillation and is on Coumadin for this. He comes in for a 6 month followup visit with a CT scan which was done today.  Past Medical History  Diagnosis Date  . Hypertension   . Hyperlipidemia   . AAA (abdominal aortic aneurysm)   . Atrial fibrillation   . BPH (benign prostatic hypertrophy)   . Encounter for long-term (current) use of anticoagulants   . Cancer     skin cancer ( Basal cell)    History reviewed. No pertinent family history.  SOCIAL HISTORY: History  Substance Use Topics  . Smoking status: Never Smoker   . Smokeless tobacco: Not on file  . Alcohol Use:     No Known Allergies  Current Outpatient Prescriptions  Medication Sig Dispense Refill  . furosemide (LASIX) 20 MG tablet TAKE ONE TABLET BY MOUTH EVERY DAY  90 tablet  3  . lisinopril (PRINIVIL,ZESTRIL) 20 MG tablet Take 20 mg by mouth daily.        . metoprolol (LOPRESSOR) 50 MG tablet Take 50 mg by mouth 2 (two) times daily. Take 1 tablet every morning and 1/2 tablet at night       . potassium chloride (KLOR-CON) 20 MEQ packet Take 20 mEq by mouth 2 (two) times daily.        . simvastatin (ZOCOR) 40 MG tablet Take 40 mg by mouth at bedtime.        Marland Kitchen terazosin (HYTRIN) 10 MG capsule Take 10 mg by mouth at bedtime.        Marland Kitchen warfarin (COUMADIN) 5 MG tablet Take 5 mg by mouth daily. Take 1 1/2 tablet daily except 1 tablet MWF       . finasteride (PROSCAR) 5 MG tablet Take 5 mg by  mouth daily.         No current facility-administered medications for this visit.   Facility-Administered Medications Ordered in Other Visits  Medication Dose Route Frequency Provider Last Rate Last Dose  . iohexol (OMNIPAQUE) 300 MG/ML injection 125 mL  125 mL Intravenous Once PRN Medication Radiologist   125 mL at 03/25/11 1156    REVIEW OF SYSTEMS: Arly.Keller ] denotes positive finding; [  ] denotes negative finding CARDIOVASCULAR:  [ ]  chest pain   [ ]  chest pressure   [ ]  palpitations   [ ]  orthopnea   [ ]  dyspnea on exertion   [ ]  claudication   [ ]  rest pain   [ ]  DVT   [ ]  phlebitis PULMONARY:   [ ]  productive cough   [ ]  asthma   [ ]  wheezing NEUROLOGIC:   [ ]  weakness  [ ]  paresthesias  [ ]  aphasia  [ ]  amaurosis  [ ]  dizziness HEMATOLOGIC:   [ ]  bleeding problems   [ ]  clotting disorders MUSCULOSKELETAL:  [ ]  joint pain   [ ]  joint swelling GASTROINTESTINAL: [ ]   blood in stool  [ ]   hematemesis GENITOURINARY:  [ ]   dysuria  [ ]   hematuria PSYCHIATRIC:  [ ]  history of major depression INTEGUMENTARY:  [ ]  rashes  [ ]  ulcers CONSTITUTIONAL:  [ ]  fever   [ ]  chills  PHYSICAL EXAM: Filed Vitals:   03/25/11 1458  BP: 160/94  Pulse: 48  Height: 6' 1.2" (1.859 m)  Weight: 242 lb 8 oz (109.997 kg)  SpO2: 97%   Body mass index is 31.82 kg/(m^2). GENERAL: The patient is a well-nourished male, in no acute distress. The vital signs are documented above. CARDIOVASCULAR: There is a regular rate and rhythm without significant murmur appreciated. I do not detect any carotid bruits. PULMONARY: There is good air exchange bilaterally without wheezing or rales. ABDOMEN: Soft and non-tender with normal pitched bowel sounds. Because of his size it is difficult to palpate his aneurysm.  MUSCULOSKELETAL: There are no major deformities or cyanosis. NEUROLOGIC: No focal weakness or paresthesias are detected. SKIN: There are no ulcers or rashes noted. PSYCHIATRIC: The patient has a normal  affect.  DATA:  I have independently reviewed his CT scan and by my measurement the maximum diameter is 5.1 cm which is decreased in size compared to 5.3 cm in 6 months ago. Stent graft is in excellent position without evidence of migration. There is no evidence of an endoleak.  MEDICAL ISSUES: The patient is doing well status post endovascular aneurysm repair. I have ordered a followup ultrasound in 6 months and I'll see him back at that time. He knows to call sooner if he has problems. Currently the graft is in excellent position with no complications noted.  Jalyn Dutta S [facility] [place] Office: [phone]

## 2011-05-02 ENCOUNTER — Emergency Department (HOSPITAL_COMMUNITY): Payer: Medicare Other

## 2011-05-02 ENCOUNTER — Encounter (HOSPITAL_COMMUNITY): Payer: Self-pay | Admitting: *Deleted

## 2011-05-02 ENCOUNTER — Other Ambulatory Visit: Payer: Self-pay

## 2011-05-02 ENCOUNTER — Inpatient Hospital Stay (HOSPITAL_COMMUNITY)
Admission: EM | Admit: 2011-05-02 | Discharge: 2011-05-07 | DRG: 203 | Disposition: A | Discharge status: Home / Self Care | Payer: Medicare Other | Attending: Internal Medicine | Admitting: Internal Medicine

## 2011-05-02 DIAGNOSIS — Z8673 Personal history of transient ischemic attack (TIA), and cerebral infarction without residual deficits: Secondary | ICD-10-CM

## 2011-05-02 DIAGNOSIS — R0902 Hypoxemia: Secondary | ICD-10-CM | POA: Clinically undetermined

## 2011-05-02 DIAGNOSIS — M25559 Pain in unspecified hip: Secondary | ICD-10-CM | POA: Diagnosis present

## 2011-05-02 DIAGNOSIS — I10 Essential (primary) hypertension: Secondary | ICD-10-CM | POA: Diagnosis present

## 2011-05-02 DIAGNOSIS — R7301 Impaired fasting glucose: Secondary | ICD-10-CM | POA: Diagnosis present

## 2011-05-02 DIAGNOSIS — I482 Chronic atrial fibrillation, unspecified: Secondary | ICD-10-CM | POA: Diagnosis present

## 2011-05-02 DIAGNOSIS — E785 Hyperlipidemia, unspecified: Secondary | ICD-10-CM | POA: Diagnosis present

## 2011-05-02 DIAGNOSIS — R509 Fever, unspecified: Secondary | ICD-10-CM

## 2011-05-02 DIAGNOSIS — J40 Bronchitis, not specified as acute or chronic: Principal | ICD-10-CM | POA: Diagnosis present

## 2011-05-02 DIAGNOSIS — I714 Abdominal aortic aneurysm, without rupture, unspecified: Secondary | ICD-10-CM | POA: Diagnosis present

## 2011-05-02 DIAGNOSIS — M25551 Pain in right hip: Secondary | ICD-10-CM | POA: Diagnosis present

## 2011-05-02 DIAGNOSIS — H9190 Unspecified hearing loss, unspecified ear: Secondary | ICD-10-CM | POA: Diagnosis present

## 2011-05-02 DIAGNOSIS — N400 Enlarged prostate without lower urinary tract symptoms: Secondary | ICD-10-CM | POA: Diagnosis present

## 2011-05-02 DIAGNOSIS — D72829 Elevated white blood cell count, unspecified: Secondary | ICD-10-CM

## 2011-05-02 DIAGNOSIS — I4891 Unspecified atrial fibrillation: Secondary | ICD-10-CM | POA: Diagnosis present

## 2011-05-02 DIAGNOSIS — Z7901 Long term (current) use of anticoagulants: Secondary | ICD-10-CM

## 2011-05-02 DIAGNOSIS — E876 Hypokalemia: Secondary | ICD-10-CM | POA: Diagnosis present

## 2011-05-02 DIAGNOSIS — I2510 Atherosclerotic heart disease of native coronary artery without angina pectoris: Secondary | ICD-10-CM | POA: Diagnosis present

## 2011-05-02 LAB — PRO B NATRIURETIC PEPTIDE: Pro B Natriuretic peptide (BNP): 1647 pg/mL — ABNORMAL HIGH (ref 0–450)

## 2011-05-02 LAB — BASIC METABOLIC PANEL
CO2: 26 mEq/L (ref 19–32)
Chloride: 100 mEq/L (ref 96–112)
GFR calc Af Amer: 83 mL/min — ABNORMAL LOW (ref >90)
Potassium: 3.4 mEq/L — ABNORMAL LOW (ref 3.5–5.1)
Sodium: 134 mEq/L — ABNORMAL LOW (ref 135–145)

## 2011-05-02 LAB — CBC
HCT: 38.3 % — ABNORMAL LOW (ref 39.0–52.0)
Platelets: 107 10*3/uL — ABNORMAL LOW (ref 150–400)
RDW: 14.2 % (ref 11.5–15.5)
WBC: 12.8 10*3/uL — ABNORMAL HIGH (ref 4.0–10.5)

## 2011-05-02 LAB — URINALYSIS, ROUTINE W REFLEX MICROSCOPIC
Glucose, UA: NEGATIVE mg/dL
pH: 5.5 (ref 5.0–8.0)

## 2011-05-02 LAB — URINE MICROSCOPIC-ADD ON

## 2011-05-02 LAB — DIFFERENTIAL
Basophils Absolute: 0 10*3/uL (ref 0.0–0.1)
Lymphocytes Relative: 3 % — ABNORMAL LOW (ref 12–46)
Neutro Abs: 11.6 10*3/uL — ABNORMAL HIGH (ref 1.7–7.7)
Neutrophils Relative %: 91 % — ABNORMAL HIGH (ref 43–77)

## 2011-05-02 LAB — PROTIME-INR: INR: 1.57 — ABNORMAL HIGH (ref 0.00–1.49)

## 2011-05-02 MED ORDER — ACETAMINOPHEN 325 MG PO TABS
ORAL_TABLET | ORAL | Status: AC
  Administered 2011-05-02: 650 mg
  Filled 2011-05-02: qty 2

## 2011-05-02 MED ORDER — DEXTROSE 5 % IV SOLN
1.0000 g | INTRAVENOUS | Status: DC
  Administered 2011-05-03 – 2011-05-06 (×5): 1 g via INTRAVENOUS
  Filled 2011-05-02 (×6): qty 10

## 2011-05-02 MED ORDER — SODIUM CHLORIDE 0.9 % IV SOLN: INTRAVENOUS | Status: DC

## 2011-05-02 MED ORDER — OSELTAMIVIR PHOSPHATE 75 MG PO CAPS
75.0000 mg | ORAL_CAPSULE | Freq: Two times a day (BID) | ORAL | Status: DC
  Administered 2011-05-03 (×2): 75 mg via ORAL
  Filled 2011-05-02 (×3): qty 1

## 2011-05-02 MED ORDER — SODIUM CHLORIDE 0.9 % IV SOLN
INTRAVENOUS | Status: DC
  Administered 2011-05-02: 125 mL via INTRAVENOUS
  Administered 2011-05-02 – 2011-05-04 (×5): via INTRAVENOUS

## 2011-05-02 MED ORDER — POTASSIUM CHLORIDE 20 MEQ PO PACK
20.0000 meq | PACK | Freq: Every day | ORAL | Status: DC
  Filled 2011-05-02: qty 1

## 2011-05-02 MED ORDER — TERAZOSIN HCL 5 MG PO CAPS
10.0000 mg | ORAL_CAPSULE | Freq: Every day | ORAL | Status: DC
  Administered 2011-05-03 – 2011-05-06 (×5): 10 mg via ORAL
  Filled 2011-05-02 (×6): qty 2

## 2011-05-02 MED ORDER — WARFARIN SODIUM 5 MG PO TABS: 5.0000 mg | ORAL_TABLET | Freq: Every day | ORAL | Status: DC

## 2011-05-02 MED ORDER — SIMVASTATIN 40 MG PO TABS
40.0000 mg | ORAL_TABLET | Freq: Every day | ORAL | Status: DC
  Administered 2011-05-03 – 2011-05-06 (×5): 40 mg via ORAL
  Filled 2011-05-02 (×6): qty 1

## 2011-05-02 MED ORDER — METOPROLOL TARTRATE 50 MG PO TABS
50.0000 mg | ORAL_TABLET | Freq: Two times a day (BID) | ORAL | Status: DC
  Administered 2011-05-03 (×2): 50 mg via ORAL
  Administered 2011-05-03: 25 mg via ORAL
  Administered 2011-05-04 – 2011-05-07 (×7): 50 mg via ORAL
  Filled 2011-05-02 (×5): qty 1
  Filled 2011-05-02: qty 2
  Filled 2011-05-02 (×5): qty 1

## 2011-05-02 MED ORDER — WARFARIN SODIUM 10 MG PO TABS
10.0000 mg | ORAL_TABLET | Freq: Once | ORAL | Status: AC
  Administered 2011-05-03: 10 mg via ORAL
  Filled 2011-05-02: qty 1

## 2011-05-02 MED ORDER — FINASTERIDE 5 MG PO TABS
5.0000 mg | ORAL_TABLET | Freq: Every day | ORAL | Status: DC
  Administered 2011-05-03 – 2011-05-07 (×5): 5 mg via ORAL
  Filled 2011-05-02 (×5): qty 1

## 2011-05-02 MED ORDER — LISINOPRIL 20 MG PO TABS
20.0000 mg | ORAL_TABLET | Freq: Every day | ORAL | Status: DC
  Administered 2011-05-03 – 2011-05-07 (×5): 20 mg via ORAL
  Filled 2011-05-02 (×5): qty 1

## 2011-05-02 NOTE — Progress Notes (Addendum)
ANTICOAGULATION CONSULT NOTE - Initial Consult  Pharmacy Consult for Coumadin Indication: Atrial fibrillation  Allergies  Allergen Reactions  . Oxycontin   . Ultram (Tramadol Hcl)     oversedation    Patient Measurements: Weight = 110 kg (on 03/25/11) Height = 73 inches   Vital Signs: Temp: 98 F (36.7 C) (12/08 1927) Temp src: Oral (12/08 1927) BP: 112/60 mmHg (12/08 1927) Pulse Rate: 80  (12/08 1927)  Labs:  Alvira Philips 05/02/11 2100 05/02/11 1730  HGB -- 12.6*  HCT -- 38.3*  PLT -- 107*  APTT -- --  LABPROT 19.1* --  INR 1.57* --  HEPARINUNFRC -- --  CREATININE -- 0.97  CKTOTAL -- --  CKMB -- --  TROPONINI -- --   The CrCl is unknown because both a height and weight (above a minimum accepted value) are required for this calculation.  Medical History: Past Medical History  Diagnosis Date  . Hypertension   . Hyperlipidemia   . AAA (abdominal aortic aneurysm)   . Atrial fibrillation   . BPH (benign prostatic hypertrophy)   . Encounter for long-term (current) use of anticoagulants   . Cancer     skin cancer ( Basal cell)    Medications:  Scheduled:    . acetaminophen      . cefTRIAXone (ROCEPHIN)  IV  1 g Intravenous Q24H  . finasteride  5 mg Oral Daily  . lisinopril  20 mg Oral Daily  . metoprolol  50 mg Oral BID  . oseltamivir  75 mg Oral BID  . potassium chloride  20 mEq Oral Daily  . simvastatin  40 mg Oral QHS  . terazosin  10 mg Oral QHS  . warfarin  5 mg Oral Daily   Infusions:    . sodium chloride 125 mL (05/02/11 1727)  . DISCONTD: sodium chloride      Assessment: 77 year old male on Coumadin 7.5 mg daily except 5mg  on MWF for atrial fibrillation.  Presents to hospital with complaint of fever and chills.  Patient with possible influenza.  Patient noted last coumadin dose was taken on 05/01/11.  Pt with subtherapeutic INR (1.57)  Goal of Therapy:  INR 2-3   Plan:  1.  Coumadin 10 mg po x 1 tonight. 2.  Check daily  PT/INR  Kutler Vanvranken Trefz 05/02/2011,11:28 PM

## 2011-05-02 NOTE — ED Notes (Signed)
Per Mariea Clonts, NT, pt became SOB upon exertion, 02 sats dropped from 96-93 while walking 20 feet.  Dr. Weldon Inches made aware.

## 2011-05-02 NOTE — ED Provider Notes (Signed)
History     CSN: [id number] Arrival date & time: 05/02/2011  2:55 PM   First MD Initiated Contact with Patient 05/02/11 1642      Chief Complaint  Patient presents with  . Dizziness  . Fatigue  . Anorexia    (Consider location/radiation/quality/duration/timing/severity/associated sxs/prior treatment) The history is provided by the patient, a relative and the spouse.   the patient is a 77 year old male, with a history of myocardial infarction, AAA repair, hypertension, and atrial fibrillation, who presents to the emergency department complaining of fatigue, weakness, and fever.  His wife says that she has noted that he seems to be some short of breath.  When he walks.  She and her daughter-in-law have noted that he has had a cough.  He seems to have pain in his right hip when he walks, since he's fallen about 2 months ago.  He denies nausea, vomiting, diarrhea, urinary tract symptoms.  He has no pain at all.  At this time.  He is taking Coumadin.  Past Medical History  Diagnosis Date  . Hypertension   . Hyperlipidemia   . AAA (abdominal aortic aneurysm)   . Atrial fibrillation   . BPH (benign prostatic hypertrophy)   . Encounter for long-term (current) use of anticoagulants   . Cancer     skin cancer ( Basal cell)    Past Surgical History  Procedure Date  . Abdominal aortic aneurysm repair     No family history on file.  History  Substance Use Topics  . Smoking status: Never Smoker   . Smokeless tobacco: Not on file  . Alcohol Use: Yes      Review of Systems  Constitutional: Positive for fever. Negative for chills.       He has not noted that he has had a fever  HENT: Negative for congestion and sore throat.   Eyes: Negative for redness.  Respiratory: Positive for cough. Negative for chest tightness and shortness of breath.   Gastrointestinal: Negative for nausea, vomiting, abdominal pain and diarrhea.  Genitourinary: Negative for dysuria and hematuria.    Musculoskeletal: Negative for back pain.       Right hip pain  Neurological: Negative for headaches.  Hematological: Does not bruise/bleed easily.  Psychiatric/Behavioral: Negative for confusion.  All other systems reviewed and are negative.    Allergies  Review of patient's allergies indicates no known allergies.  Home Medications   Current Outpatient Rx  Name Route Sig Dispense Refill  . FINASTERIDE 5 MG PO TABS Oral Take 5 mg by mouth daily.      . FUROSEMIDE 20 MG PO TABS  TAKE ONE TABLET BY MOUTH EVERY DAY 90 tablet 3  . LISINOPRIL 20 MG PO TABS Oral Take 20 mg by mouth daily.      Marland Kitchen METOPROLOL TARTRATE 50 MG PO TABS Oral Take 50 mg by mouth 2 (two) times daily. Take 1 tablet every morning and 1/2 tablet at night     . POTASSIUM CHLORIDE 20 MEQ PO PACK Oral Take 20 mEq by mouth daily.     Marland Kitchen SIMVASTATIN 40 MG PO TABS Oral Take 40 mg by mouth at bedtime.      . TERAZOSIN HCL 10 MG PO CAPS Oral Take 10 mg by mouth at bedtime.      . WARFARIN SODIUM 5 MG PO TABS Oral Take 5 mg by mouth daily. Take 1 1/2 tablet daily except 1 tablet MWF       BP 124/63  Pulse 104  Temp(Src) 101.8 F (38.8 C) (Oral)  Resp 20  SpO2 92%  Physical Exam  Constitutional: He is oriented to person, place, and time. He appears well-developed and well-nourished. No distress.  HENT:  Head: Normocephalic and atraumatic.  Eyes: EOM are normal. Pupils are equal, round, and reactive to light.  Neck: Normal range of motion. Neck supple.  Cardiovascular:  Murmur heard.      Irregular heart beat  Pulmonary/Chest: Effort normal and breath sounds normal. No respiratory distress. He has no wheezes. He has no rales.  Abdominal: Soft. Bowel sounds are normal. He exhibits no distension and no mass. There is no tenderness. There is no rebound and no guarding.       Morbidly obese  Musculoskeletal: Normal range of motion. He exhibits no edema and no tenderness.       Full range of motion of her lower extremity,  with no pain, no tenderness to palpation of her hip  Neurological: He is alert and oriented to person, place, and time. No cranial nerve deficit.  Skin: Skin is warm and dry. He is not diaphoretic.  Psychiatric: He has a normal mood and affect. His behavior is normal.    ED Course  Procedures (including critical care time) 77 year old male, presents to emergency department with fatigue and weakness for a few days.  He has a mild fever in the emergency department and mild tachycardia.  His oxygen saturation decreases when he is on room air.  We will perform a chest x-ray, blood and urine tests, try to determine the etiology for his fever, and weakness.  He does not need any medications or acute intervention at this time.  Labs Reviewed  CBC  DIFFERENTIAL  BASIC METABOLIC PANEL  URINALYSIS, ROUTINE W REFLEX MICROSCOPIC   No results found.   8:14 PM He feels better.  He is defervesced.  We will have him ambulate in the emergency department to see L.  He feels.  Then, we'll make a final disposition.  9:59 PM Patient desaturated when he ambulated in the emergency department became very short of breath.  Therefore, I called Dr. Laurene Footman.  He agreed to come, evaluate the patient for admission to   MD[facility]  Fever, leukocytosis, dyspnea on exertion, with mild hypoxia No pneumonia        Nicholes Stairs, MD 05/02/11 2159

## 2011-05-02 NOTE — ED Notes (Signed)
EDP (Dr. Weldon Inches) at bedside, pt made aware of plan of care and hospital admission.

## 2011-05-02 NOTE — ED Notes (Signed)
500 ml normal saline bolus infused---HR now in the 90's.

## 2011-05-02 NOTE — H&P (Addendum)
PCP:   Ezequiel Kayser, MD, MD   Chief Complaint:  Fevers chills weakness  HPI: This is an 77 year old white male admitted from home. He's been sick since Thursday with malaise chills for 2 days and poor by mouth intake. He is usually quite active. He's had a nonproductive cough for couple days and marked dyspnea on exertion. 1 new for him he is staying in bed much of the day. He's he has been very poor with no supper on Thursday no breakfast only half a cup of soup on right and today only coffee and hot chocolate. He's had no nausea or vomiting. His bowels are working couple days. He has no cardiac chest pain. He has had no more swelling. He notes no headache. He's had no urinary burning. He's had no pleuritic chest pain. He's had no abdominal pain. He's had no unusual travel or exposures. His vision is stable.  CONTACT DR. Laverta Baltimore [phone]  Review of Systems:  Review of Systems - History obtained from the patient. He's been taking his medications as directed. He's not had an illness quite like this anytime recently. He has no peripheral vascular complaints at the present time. His mild peripheral edema is baseline. He has had pain in his right hip the last 2 weeks or so. Apparently he had x-rays that were negative. He does now her however in light on this. Past Medical History: Past Medical History  Diagnosis Date  . Hypertension   . Hyperlipidemia   . AAA (abdominal aortic aneurysm), with endovasc repair 2011   . Atrial fibrillation   . BPH (benign prostatic hypertrophy)   . Encounter for long-term (current) use of anticoagulants   . Cancer     skin cancer ( Basal cell)    TIA Multiple rib fractures Coronary artery disease Bilateral cataracts Diminished hearing Impaired fasting glucose Suspected tiny gallstones, CT 2012  1. Obesity.   2. Hypertension.   3. Hypercholesterolemia.   4. History of atrial fibrillation 2012: . Bilateral internal carotid artery velocities  are suggestive of a 1%       to 39% stenosis. Past Surgical History  Procedure Date  . components)Abdominal aortic aneurysm repair: 2011 Endovascular repair of abdominal aortic aneurysm with Gore  excluder stent graft (threecomponents 2008: Hemorrhoidectomy     Medications: Prior to Admission medications   Medication Sig Start Date End Date Taking? Authorizing Provider  finasteride (PROSCAR) 5 MG tablet Take 5 mg by mouth daily.     Yes Historical Provider, MD  furosemide (LASIX) 20 MG tablet TAKE ONE TABLET BY MOUTH EVERY DAY 03/02/11  Yes Cassell Clement, MD  lisinopril (PRINIVIL,ZESTRIL) 20 MG tablet Take 20 mg by mouth daily.     Yes Historical Provider, MD  metoprolol (LOPRESSOR) 50 MG tablet Take 50 mg by mouth 2 (two) times daily. Take 1 tablet every morning and 1/2 tablet at night    Yes Historical Provider, MD  potassium chloride (KLOR-CON) 20 MEQ packet Take 20 mEq by mouth daily.    Yes Historical Provider, MD  simvastatin (ZOCOR) 40 MG tablet Take 40 mg by mouth at bedtime.     Yes Historical Provider, MD  terazosin (HYTRIN) 10 MG capsule Take 10 mg by mouth at bedtime.     Yes Historical Provider, MD  warfarin (COUMADIN) 5 MG tablet Take 5 mg by mouth daily. Take 1 1/2 tablet daily except 1 tablet MWF    Yes Historical Provider, MD    Allergies:   Allergies  Allergen Reactions  . Oxycontin   . Ultram (Tramadol Hcl)     oversedation    Social History: Ex-smoker since 7. He does not have any smokeless tobacco history on file. He reports that he drinks alcohol. He reports that he does not use illicit drugs. Retired from [employer] after 30 years 4 children 6 stepchildren and 17 grandchildren  Family History: No family history on file.  Physical Exam: Filed Vitals:   05/02/11 1502 05/02/11 1531 05/02/11 1927  BP: 132/69 124/63 112/60  Pulse: 97 104 80  Temp: 99.7 F (37.6 C) 101.8 F (38.8 C) 98 F (36.7 C)  TempSrc: Oral Oral Oral  Resp: 20 20 20   SpO2:  92% 92% 99%   General appearance: alert but unwell-appearing. Lying on his left side in no distress. Face is symmetric. Oral mucous membranes are fairly moist. Dentition is in fair repair. Oral pharynx is clear. Tympanic membranes are clear. Neck: no adenopathy, no carotid bruit, no JVD and thyroid not enlarged, neck is supple Resp: lungs he has some dry crackles on the right more than left. No accessory muscles are used. I hear no rubs. Cardio: irregularly irregular rhythm with a systolic murmur.  Abdomen: Obese soft nontender with no masses hypoactive bowel sounds no pulsations  Extremities: Trace edema with relatively preserved pulses Neuro: The patient awakens easily but is a bit somnolent. He is hard of hearing. His speech is clear and fluent. No evidence of hallucinations or delusions are present. He is globally weak. I did not walk him. Skin exam: Warm and dry without rashes.   Lymph nodes: Cervical adenopathy: no cervical lymphadenopathy     Labs on Admission:   [facility] 05/02/11 1730  NA 134*  K 3.4*  CL 100  CO2 26  GLUCOSE 134*  BUN 23  CREATININE 0.97  CALCIUM 9.7  MG --  PHOS --      Basename 05/02/11 1730  WBC 12.8*  NEUTROABS 11.6*  HGB 12.6*  HCT 38.3*  MCV 84.7  PLT 107*   No results found for this basename: CKTOTAL:3,CKMB:3,CKMBINDEX:3,TROPONINI:3 in the last 72 hours Lab Results  Component Value Date   INR 1.57* 05/02/2011   INR 2.06* 01/20/2011   INR 2.55* 10/27/2009   No results found for this basename: TSH,T4TOTAL,FREET3,T3FREE,THYROIDAB in the last 72 hours No results found for this basename: VITAMINB12:2,FOLATE:2,FERRITIN:2,TIBC:2,IRON:2,RETICCTPCT:2 in the last 72 hours  Radiological Exams on Admission: Dg Chest 2 View  05/02/2011  *RADIOLOGY REPORT*  Clinical Data: Fever and cough.  Hypoxia.  CHEST - 2 VIEW  Comparison: Single view chest 09/25/2009.  Findings: The heart is mildly enlarged.  The lung volumes are low. Mild bibasilar airspace  disease likely reflects atelectasis.  No focal consolidation is evident.  The right hemidiaphragm remains elevated.  Some chronic interstitial coarsening is again noted.  IMPRESSION:  1.  Cardiomegaly without failure. 2.  Low lung volumes and mild bibasilar atelectasis. 3.  Mild interstitial coarsening is likely chronic.  Original Report Authenticated By: Jamesetta Orleans. MATTERN, M.D.   Dg Hip Complete Right  05/02/2011  *RADIOLOGY REPORT*  Clinical Data: 3 months ago with persistent pain.  RIGHT HIP - COMPLETE 2+ VIEW  Comparison: None.  Findings: A single AP view of the pelvis demonstrates an aortic stent graft.  Degenerative changes are noted at the SI joints bilaterally with partial fusion.  Mild degenerative changes are present in the hips.  The right hip is located.  No acute bone or soft tissue abnormalities present.  Atherosclerotic calcifications are present in the femoral artery.  Multiple phleboliths are seen in the anatomic pelvis.  IMPRESSION:  1.  No acute abnormality. 2.  Mild degenerative changes of the hips. 3.  Aortic stent graft. 4.  Pelvic phleboliths.  Original Report Authenticated By: Jamesetta Orleans. MATTERN, M.D.   Orders placed during the hospital encounter of 05/02/11  . ED EKG  . ED EKG    Assessment/Plan Principal Problem:  *Fever and chills: The patient is clearly compromised by this infectious illness. This sounds like he could be influenza related but was surprised that his white blood cell count is up. He is also hypoxic. I think are in with Tamiflu and Rocephin seems reasonable given his advanced age and other issues. He is not hypotensive at the present time and has no evidence of sepsis. I do not see any that indicates a CNS infection. Hydration is required. Active Problems:  Hypoxia: The patient is chronically on Coumadin I doubt pulmonary embolus has occurred. He does have some crackles on exam and may "fluff out" and infiltrate with fluid.    Atrial fibrillation"  rate is reasonable and there is no bleeding was medications  Coronary atherosclerosis: I see no evidence of cardiac decompensation.  Impaired fasting glucose: Blood sugars a bit with this stress.  Unspecified hearing loss: Stable.  Unspecified essential hypertension: We'll hold his diuretics.  Right hip pain: X-rays did not show any evidence of fracture. He may end up needing a bone scan this continuing pain  AAA (abdominal aortic aneurysm) without rupture: He said this repaired endovascularly.  Other and unspecified hyperlipidemia: Hold meds for now Hypokalemia: Continue his potassium   Shantana Christon ALAN 05/02/2011, 10:32 PM

## 2011-05-02 NOTE — ED Notes (Signed)
Oral temp rechecked 98.9--pt. More alert--family members at bedside.

## 2011-05-02 NOTE — ED Notes (Signed)
Normal saline 500 ml bolus infusing per verbal order Dr. Salena Saner.

## 2011-05-02 NOTE — ED Notes (Signed)
B/P 126/75  HR  91  R 27  Pulse Ox 99 on 2 liters of 02--Wife at bedside.

## 2011-05-02 NOTE — ED Notes (Signed)
Oral temp retaken  101.9--Noted pulse ox to be fluctuating between high 80's and mid 90's--placed on 02 at 2 liters via nasal  Cannula--02 sat 97%

## 2011-05-02 NOTE — ED Notes (Signed)
Drinking soft drink and tolerating well.

## 2011-05-02 NOTE — ED Notes (Signed)
Pt with multiple complaints, fatigue, weakness and loss of appetite, pt with irr hr in triage

## 2011-05-02 NOTE — ED Notes (Signed)
Spoke with lab about an influenza swab kit, they are tubing one to the ED.

## 2011-05-03 LAB — CBC
HCT: 35.6 % — ABNORMAL LOW (ref 39.0–52.0)
MCH: 28.3 pg (ref 26.0–34.0)
MCV: 85.4 fL (ref 78.0–100.0)
Platelets: 92 10*3/uL — ABNORMAL LOW (ref 150–400)
RDW: 14.2 % (ref 11.5–15.5)

## 2011-05-03 LAB — COMPREHENSIVE METABOLIC PANEL
AST: 17 U/L (ref 0–37)
BUN: 19 mg/dL (ref 6–23)
CO2: 25 mEq/L (ref 19–32)
Calcium: 9.2 mg/dL (ref 8.4–10.5)
Creatinine, Ser: 0.91 mg/dL (ref 0.50–1.35)
GFR calc non Af Amer: 73 mL/min — ABNORMAL LOW (ref >90)

## 2011-05-03 LAB — PROTIME-INR: Prothrombin Time: 19.8 seconds — ABNORMAL HIGH (ref 11.6–15.2)

## 2011-05-03 LAB — INFLUENZA PANEL BY PCR (TYPE A & B): Influenza A By PCR: NEGATIVE

## 2011-05-03 MED ORDER — ALBUTEROL SULFATE (5 MG/ML) 0.5% IN NEBU
2.5000 mg | INHALATION_SOLUTION | Freq: Four times a day (QID) | RESPIRATORY_TRACT | Status: DC | PRN
  Administered 2011-05-03 – 2011-05-05 (×3): 2.5 mg via RESPIRATORY_TRACT
  Filled 2011-05-03 (×3): qty 0.5

## 2011-05-03 MED ORDER — POTASSIUM CHLORIDE CRYS ER 20 MEQ PO TBCR
20.0000 meq | EXTENDED_RELEASE_TABLET | Freq: Every day | ORAL | Status: DC
  Administered 2011-05-03 – 2011-05-07 (×5): 20 meq via ORAL
  Filled 2011-05-03 (×5): qty 1

## 2011-05-03 MED ORDER — WARFARIN SODIUM 10 MG PO TABS
10.0000 mg | ORAL_TABLET | Freq: Once | ORAL | Status: AC
  Administered 2011-05-03: 10 mg via ORAL
  Filled 2011-05-03: qty 1

## 2011-05-03 NOTE — ED Notes (Signed)
Pt has had the following immunizations:  Flu Vax:  Fluzone (04/09/2010) Pneumovax (06/24/2007) TD Booster (12/17/2010) Pneumovax (12/17/2010) Flu Vax (03/05/2011)

## 2011-05-03 NOTE — Progress Notes (Addendum)
Subjective: Overall the patient is feeling somewhat better. He sitting up eating some ice cream. He left the potato soup on touch. He has no achiness today. He does have some nasal congestion and blowing his nose. He still a bit short of breath. His hip is not hurting at present.  Objective: Vital signs in last 24 hours: Temp:  [98 F (36.7 C)-101.8 F (38.8 C)] 98.6 F (37 C) (12/09 0600) Pulse Rate:  [77-104] 86  (12/09 0600) Resp:  [18-20] 20  (12/09 0600) BP: (112-146)/(60-98) 133/98 mmHg (12/09 0600) SpO2:  [92 %-99 %] 96 % (12/09 0600) Weight:  [107.004 kg (235 lb 14.4 oz)] 235 lb 14.4 oz (107.004 kg) (12/09 0335)  Intake/Output from previous day:   Intake/Output this shift: Total I/O In: 120 [P.O.:120] Out: -   General: alert General appearance: alert but unwell-appearing. He actually looks a bit better than yesterday  .  Face is symmetric. Oral mucous membranes are fairly moist. Dentition is in fair repair. Oral pharynx is clear. Tympanic membranes are clear.  Neck: no adenopathy, no carotid bruit, no JVD and thyroid not enlarged, neck is supple  Resp: He does have a few rhonchi and adventitious sounds  Cardio: irregularly irregular rhythm with a systolic murmur.  Abdomen: Obese soft nontender with no masses hypoactive bowel sounds no pulsations  Extremities: Trace edema with relatively preserved pulses  Neuro: He is sitting up and more conversant. Then a nice discussion about my photos at the office and clearly he is back to his baseline. Skin exam: Warm and dry without rashes.  Lymph nodes: Cervical adenopathy: no cervical lymphadenopathy   Lab Results   Basename 05/03/11 0730 05/02/11 1730  WBC 10.7* 12.8*  RBC 4.17* 4.52  HGB 11.8* 12.6*  HCT 35.6* 38.3*  MCV 85.4 84.7  MCH 28.3 27.9  RDW 14.2 14.2  PLT 92* 107*    Basename 05/03/11 0730 05/02/11 1730  NA 134* 134*  K 3.5 3.4*  CL 102 100  CO2 25 26  GLUCOSE 121* 134*  BUN 19 23  CREATININE 0.91 0.97    CALCIUM 9.2 9.7    Studies/Results: Dg Chest 2 View  05/02/2011  *RADIOLOGY REPORT*  Clinical Data: Fever and cough.  Hypoxia.  CHEST - 2 VIEW  Comparison: Single view chest 09/25/2009.  Findings: The heart is mildly enlarged.  The lung volumes are low. Mild bibasilar airspace disease likely reflects atelectasis.  No focal consolidation is evident.  The right hemidiaphragm remains elevated.  Some chronic interstitial coarsening is again noted.  IMPRESSION:  1.  Cardiomegaly without failure. 2.  Low lung volumes and mild bibasilar atelectasis. 3.  Mild interstitial coarsening is likely chronic.  Original Report Authenticated By: Jamesetta Orleans. MATTERN, M.D.   Dg Hip Complete Right  05/02/2011  *RADIOLOGY REPORT*  Clinical Data: 3 months ago with persistent pain.  RIGHT HIP - COMPLETE 2+ VIEW  Comparison: None.  Findings: A single AP view of the pelvis demonstrates an aortic stent graft.  Degenerative changes are noted at the SI joints bilaterally with partial fusion.  Mild degenerative changes are present in the hips.  The right hip is located.  No acute bone or soft tissue abnormalities present.  Atherosclerotic calcifications are present in the femoral artery.  Multiple phleboliths are seen in the anatomic pelvis.  IMPRESSION:  1.  No acute abnormality. 2.  Mild degenerative changes of the hips. 3.  Aortic stent graft. 4.  Pelvic phleboliths.  Original Report Authenticated By: Jamesetta Orleans.  MATTERN, M.D.    Scheduled Meds:   . acetaminophen      . cefTRIAXone (ROCEPHIN)  IV  1 g Intravenous Q24H  . finasteride  5 mg Oral Daily  . lisinopril  20 mg Oral Daily  . metoprolol  50 mg Oral BID  . oseltamivir  75 mg Oral BID  . potassium chloride  20 mEq Oral Daily  . simvastatin  40 mg Oral QHS  . terazosin  10 mg Oral QHS  . warfarin  10 mg Oral Once  . warfarin  10 mg Oral ONCE-1800  . DISCONTD: potassium chloride  20 mEq Oral Daily  . DISCONTD: warfarin  5 mg Oral Daily   Continuous  Infusions:   . sodium chloride 125 mL/hr at 05/03/11 0929  . DISCONTD: sodium chloride     PRN Meds:  Assessment/Plan:  *Fever and chills: He seems to be doing better. Maximum temperature was 101.8. He notes no chills or sweats. His white count is down a bit. Hydration clearly has been a difference  Flu swab was negative, will stop Tamiflu Active Problems:  Hypoxia:   Saturations are ranging 92-99% Atrial fibrillation" rate is reasonable and no bleeding on meds Coronary atherosclerosis: I see no evidence of cardiac decompensation.  Impaired fasting glucose: Blood sugars a bit with this stress. But are improved Unspecified hearing loss: Stable.  Unspecified essential hypertension: We'll hold his diuretics.  Right hip pain: X-rays did not show any evidence of fracture. He may end up needing a bone scan this continuing pain  AAA (abdominal aortic aneurysm) without rupture: He has had this repaired endovascularly.  Other and unspecified hyperlipidemia: Hold meds for now  Hypokalemia: Continue his potassium, better at 3.5   LOS: 1 day   Chad Buchanan ALAN 05/03/2011, 12:32 PM

## 2011-05-03 NOTE — ED Notes (Signed)
Called respiratory concerning the continuous breathing treatment that was ordered over an hour ago, was told "I'm aware, it will be a minute."

## 2011-05-03 NOTE — ED Notes (Signed)
Attempted to call report to TCU, was told that nurse was taking a pt upstairs.  Will call back in 10 min.

## 2011-05-03 NOTE — Progress Notes (Signed)
ANTICOAGULATION CONSULT NOTE - Follow up Consult  Pharmacy Consult for Warfarin Indication: Atrial fibrillation  Allergies  Allergen Reactions  . Oxycontin   . Ultram (Tramadol Hcl)     oversedation    Patient Measurements: Weight = 110 kg (on 03/25/11) Height = 73 inches   Vital Signs: Temp: 98.6 F (37 C) (12/09 0600) Temp src: Oral (12/09 0600) BP: 133/98 mmHg (12/09 0600) Pulse Rate: 86  (12/09 0600)  Labs:  Basename 05/03/11 0730 05/02/11 2100 05/02/11 1730  HGB 11.8* -- 12.6*  HCT 35.6* -- 38.3*  PLT 92* -- 107*  APTT -- -- --  LABPROT 19.8* 19.1* --  INR 1.65* 1.57* --  HEPARINUNFRC -- -- --  CREATININE 0.91 -- 0.97  CKTOTAL -- -- --  CKMB -- -- --  TROPONINI -- -- --   Estimated Creatinine Clearance: 72 ml/min (by C-G formula based on Cr of 0.91).  Medical History: Past Medical History  Diagnosis Date  . Hypertension   . Hyperlipidemia   . AAA (abdominal aortic aneurysm)   . Atrial fibrillation   . BPH (benign prostatic hypertrophy)   . Encounter for long-term (current) use of anticoagulants   . Cancer     skin cancer ( Basal cell)    Medications:  Scheduled:     . acetaminophen      . cefTRIAXone (ROCEPHIN)  IV  1 g Intravenous Q24H  . finasteride  5 mg Oral Daily  . lisinopril  20 mg Oral Daily  . metoprolol  50 mg Oral BID  . oseltamivir  75 mg Oral BID  . potassium chloride  20 mEq Oral Daily  . simvastatin  40 mg Oral QHS  . terazosin  10 mg Oral QHS  . warfarin  10 mg Oral Once  . DISCONTD: potassium chloride  20 mEq Oral Daily  . DISCONTD: warfarin  5 mg Oral Daily   Infusions:     . sodium chloride 125 mL/hr at 05/03/11 0929  . DISCONTD: sodium chloride      Assessment: 77 year old male on Coumadin PTA for atrial fibrillation. Subtherapeutic INR  Noted decreased H/H and Platelets 92.  No bleeding reported.   Goal of Therapy:  INR 2-3   Plan:  1.  Repeat boosted Warfarin 10 mg po x 1 tonight. 2.  Check daily  PT/INR  Lynann Beaver PharmD  Pager [phone] 05/03/2011 10:51 AM

## 2011-05-04 ENCOUNTER — Inpatient Hospital Stay (HOSPITAL_COMMUNITY): Payer: Medicare Other

## 2011-05-04 LAB — CBC
HCT: 33.7 % — ABNORMAL LOW (ref 39.0–52.0)
Hemoglobin: 10.8 g/dL — ABNORMAL LOW (ref 13.0–17.0)
MCHC: 32 g/dL (ref 30.0–36.0)
MCV: 85.3 fL (ref 78.0–100.0)
RDW: 14.3 % (ref 11.5–15.5)
WBC: 8.8 10*3/uL (ref 4.0–10.5)

## 2011-05-04 LAB — BASIC METABOLIC PANEL
BUN: 18 mg/dL (ref 6–23)
Chloride: 105 mEq/L (ref 96–112)
Creatinine, Ser: 0.91 mg/dL (ref 0.50–1.35)
GFR calc Af Amer: 85 mL/min — ABNORMAL LOW (ref >90)
Glucose, Bld: 111 mg/dL — ABNORMAL HIGH (ref 70–99)

## 2011-05-04 MED ORDER — WARFARIN SODIUM 5 MG PO TABS
5.0000 mg | ORAL_TABLET | Freq: Once | ORAL | Status: AC
  Administered 2011-05-04: 5 mg via ORAL
  Filled 2011-05-04: qty 1

## 2011-05-04 MED ORDER — FLORA-Q PO CAPS
1.0000 | ORAL_CAPSULE | Freq: Every day | ORAL | Status: DC
  Administered 2011-05-04 – 2011-05-07 (×4): 1 via ORAL
  Filled 2011-05-04 (×4): qty 1

## 2011-05-04 NOTE — Progress Notes (Signed)
Patient ID: Chad Buchanan, male   DOB: [birth year], 77 y.o.   MRN: [id number] Subjective: He denies complaints this a.m.. In a deep sleep but arousable and appropriate.  Objective: Vital signs in last 24 hours: Temp:  [97.7 F (36.5 C)-98.9 F (37.2 C)] 98.9 F (37.2 C) (12/09 2200) Pulse Rate:  [81-86] 81  (12/09 2200) Resp:  [20] 20  (12/09 2200) BP: (127-136)/(66-77) 136/66 mmHg (12/09 2200) SpO2:  [95 %-98 %] 98 % (12/09 2200) Weight change:  Last BM Date: 05/02/11  Intake/Output from previous day: 12/09 0701 - 12/10 0700 In: 480 [P.O.:480] Out: 740 [Urine:740] Intake/Output this shift:    General appearance: alert and cooperative Resp: clear to auscultation bilaterally Cardio: regular rate and rhythm, S1, S2 normal, no murmur, click, rub or gallop GI: soft, non-tender; bowel sounds normal; no masses,  no organomegaly Extremities: extremities normal, atraumatic, no cyanosis or edema Neurologic: Grossly normal   Lab Results:  Basename 05/04/11 0532 05/03/11 0730  WBC 8.8 10.7*  HGB 10.8* 11.8*  HCT 33.7* 35.6*  PLT 100* 92*   BMET  Basename 05/04/11 0532 05/03/11 0730  NA 136 134*  K 3.6 3.5  CL 105 102  CO2 25 25  GLUCOSE 111* 121*  BUN 18 19  CREATININE 0.91 0.91  CALCIUM 9.1 9.2   CMET CMP     Component Value Date/Time   NA 136 05/04/2011 0532   K 3.6 05/04/2011 0532   CL 105 05/04/2011 0532   CO2 25 05/04/2011 0532   GLUCOSE 111* 05/04/2011 0532   BUN 18 05/04/2011 0532   CREATININE 0.91 05/04/2011 0532   CREATININE 0.97 03/13/2011 1123   CALCIUM 9.1 05/04/2011 0532   PROT 5.7* 05/03/2011 0730   ALBUMIN 2.4* 05/03/2011 0730   AST 17 05/03/2011 0730   ALT 14 05/03/2011 0730   ALKPHOS 62 05/03/2011 0730   BILITOT 0.8 05/03/2011 0730   GFRNONAA 73* 05/04/2011 0532   GFRAA 85* 05/04/2011 0532     Studies/Results: Dg Chest 2 View  05/02/2011  *RADIOLOGY REPORT*  Clinical Data: Fever and cough.  Hypoxia.  CHEST - 2 VIEW  Comparison: Single view  chest 09/25/2009.  Findings: The heart is mildly enlarged.  The lung volumes are low. Mild bibasilar airspace disease likely reflects atelectasis.  No focal consolidation is evident.  The right hemidiaphragm remains elevated.  Some chronic interstitial coarsening is again noted.  IMPRESSION:  1.  Cardiomegaly without failure. 2.  Low lung volumes and mild bibasilar atelectasis. 3.  Mild interstitial coarsening is likely chronic.  Original Report Authenticated By: Jamesetta Orleans. MATTERN, M.D.   Dg Hip Complete Right  05/02/2011  *RADIOLOGY REPORT*  Clinical Data: 3 months ago with persistent pain.  RIGHT HIP - COMPLETE 2+ VIEW  Comparison: None.  Findings: A single AP view of the pelvis demonstrates an aortic stent graft.  Degenerative changes are noted at the SI joints bilaterally with partial fusion.  Mild degenerative changes are present in the hips.  The right hip is located.  No acute bone or soft tissue abnormalities present.  Atherosclerotic calcifications are present in the femoral artery.  Multiple phleboliths are seen in the anatomic pelvis.  IMPRESSION:  1.  No acute abnormality. 2.  Mild degenerative changes of the hips. 3.  Aortic stent graft. 4.  Pelvic phleboliths.  Original Report Authenticated By: Jamesetta Orleans. MATTERN, M.D.    Medications: I have reviewed the patient's current medications.  Assessment/Plan:  Principal Problem:  *Fever and chills-improving with  the antibiotic. Active Problems:  Hypoxia-try to wean oxygen.  Atrial fibrillation-continue meds.  Coronary atherosclerosis-follow  Impaired fasting glucose-follow  Unspecified hearing loss-follow  Unspecified essential hypertension-bp okay  Right hip pain-pt/ot eval.  AAA (abdominal aortic aneurysm) without rupture-follow  Other and unspecified hyperlipidemia-follow  Hypokalemia-better.   LOS: 2 days   Ezequiel Kayser, MD 05/04/2011, 7:12 AM

## 2011-05-04 NOTE — Plan of Care (Signed)
Problem: Phase II Progression Outcomes Goal: Discharge plan established Pt doing fairly well, decreased safety/balance during functional mobility. Recommend HH OT eval for ADL safety and tub bench for home use after acute discharge

## 2011-05-04 NOTE — Progress Notes (Signed)
Physical Therapy Evaluation Patient Details Name: Chad Buchanan MRN: [id number] DOB: July 19, 1920 Today's Date: 05/04/2011 Time: 1000-1025  Eval II Problem List:  Patient Active Problem List  Diagnoses  . Abdominal aneurysm without mention of rupture  . Fever and chills  . Hypoxia  . Atrial fibrillation  . Coronary atherosclerosis  . Impaired fasting glucose  . Unspecified hearing loss  . Unspecified essential hypertension  . Right hip pain  . AAA (abdominal aortic aneurysm) without rupture  . Other and unspecified hyperlipidemia  . Hypokalemia    Past Medical History:  Past Medical History  Diagnosis Date  . Hypertension   . Hyperlipidemia   . AAA (abdominal aortic aneurysm)   . Atrial fibrillation   . BPH (benign prostatic hypertrophy)   . Encounter for long-term (current) use of anticoagulants   . Cancer     skin cancer ( Basal cell)   Past Surgical History:  Past Surgical History  Procedure Date  . Abdominal aortic aneurysm repair     PT Assessment/Plan/Recommendation PT Assessment Clinical Impression Statement: Pt presents with diagnosis of fever, chills, general malaise, weakness and pain in R hip with activity. Pt will benefit from skilled PT in the acute care setting to improve general strength, activity tolerance and overall independence with basic functional mobility in preperation for home with wife and family assist PRN PT Recommendation/Assessment: Patient will need skilled PT in the acute care venue PT Problem List: Decreased strength;Decreased activity tolerance;Decreased mobility;Decreased knowledge of use of DME PT Therapy Diagnosis : Difficulty walking;Generalized weakness PT Plan PT Frequency: Min 3X/week PT Treatment/Interventions: DME instruction;Gait training;Functional mobility training;Therapeutic exercise;Therapeutic activities;Patient/family education;Stair training PT Recommendation Recommendations for Other Services: OT consult (already on  board) Follow Up Recommendations: Home health PT Equipment Recommended: None recommended by PT PT Goals  Acute Rehab PT Goals PT Goal Formulation: With patient Pt will go Supine/Side to Sit: with supervision Pt will go Sit to Supine/Side: with supervision Pt will go Sit to Stand: with supervision Pt will go Stand to Sit: with supervision Pt will Ambulate: 51 - 150 feet;with supervision;with least restrictive assistive device Pt will Go Up / Down Stairs: 1-2 stairs;with least restrictive assistive device;with min assist (1 step up)  PT Evaluation Precautions/Restrictions  Restrictions Weight Bearing Restrictions: No Prior Functioning  Home Living Lives With: Spouse Receives Help From: Family Type of Home: House Home Layout: Multi-level;Full bath on main level;Able to live on main level with bedroom/bathroom Home Access: Stairs to enter Entrance Stairs-Number of Steps: 1 step Bathroom Shower/Tub: Engineer, manufacturing systems: Standard Home Adaptive Equipment: Bedside commode/3-in-1;Straight cane;Walker - rolling Prior Function Level of Independence: Requires assistive device for independence Driving: Yes Cognition Cognition Arousal/Alertness: Awake/alert Overall Cognitive Status: Appears within functional limits for tasks assessed Orientation Level: Oriented X4 Cognition - Other Comments: HOH Sensation/Coordination Coordination Gross Motor Movements are Fluid and Coordinated: Yes Extremity Assessment RLE Assessment RLE Assessment: Exceptions to [facility] RLE Strength RLE Overall Strength Comments: Strength > or = 4/5 . LLE Assessment LLE Assessment: Exceptions to [facility] LLE Strength LLE Overall Strength Comments: Strength > or = 4/5 Mobility (including Balance) Bed Mobility Bed Mobility: Yes Supine to Sit: 4: Min assist;HOB elevated (Comment degrees) Supine to Sit Details (indicate cue type and reason): VCs safety, technique. Increased time. Assist for trunk to  upright. Transfers Transfers: Yes Sit to Stand: From elevated surface;From bed;3: Mod assist Sit to Stand Details (indicate cue type and reason): 2 attempts to rise. Pt uses momentum to transition. Assist to rise,  stabilize. VCs safety, technique, hand placement. Stand to Sit Details: Min-guard assist. VCs safety, technique, hand placement Ambulation/Gait Ambulation/Gait: Yes Ambulation/Gait Assistance: 4: Min assist Ambulation/Gait Assistance Details (indicate cue type and reason): VCs safety/safe use of RW, posture, pacing. Pt with flexed trunk-able to self correct but did not maintain throughout ambulation. some SOB noted with ambulation. Slightly unsteady and noted moderate reliance on bilateral UEs with RW use.  Ambulation Distance (Feet): 130 Feet Assistive device: Rolling walker Gait Pattern: Trunk flexed  Posture/Postural Control Posture/Postural Control: No significant limitations Balance Balance Assessed: No (Slightly unsteady with ambulation) Exercise    End of Session PT - End of Session Equipment Utilized During Treatment: Gait belt (RW) Activity Tolerance: Patient tolerated treatment well Patient left: in chair;with call bell in reach;with family/visitor present General Behavior During Session: [facility] for tasks performed Cognition: [facility] for tasks performed  Rebeca Alert [facility] 05/04/2011, 10:42 AM

## 2011-05-04 NOTE — Progress Notes (Signed)
MD- Patient brought in his advanced directives papers.  Nursing staff has placed a copy in the ghost chart, but his code status needs to be addressed in EPIC please. Thanks!

## 2011-05-04 NOTE — Progress Notes (Signed)
ANTICOAGULATION CONSULT NOTE - Follow up Consult  Pharmacy Consult for Warfarin Indication: Atrial fibrillation  Allergies  Allergen Reactions  . Oxycontin   . Ultram (Tramadol Hcl)     oversedation    Patient Measurements: Weight = 110 kg (on 03/25/11) Height = 73 inches   Vital Signs: Temp: 97.9 F (36.6 C) (12/10 0600) Temp src: Oral (12/10 0600) BP: 122/60 mmHg (12/10 0600) Pulse Rate: 79  (12/10 0600)  Labs:  Basename 05/04/11 0532 05/03/11 0730 05/02/11 2100 05/02/11 1730  HGB 10.8* 11.8* -- --  HCT 33.7* 35.6* -- 38.3*  PLT 100* 92* -- 107*  APTT -- -- -- --  LABPROT 22.7* 19.8* 19.1* --  INR 1.96* 1.65* 1.57* --  HEPARINUNFRC -- -- -- --  CREATININE 0.91 0.91 -- 0.97  CKTOTAL -- -- -- --  CKMB -- -- -- --  TROPONINI -- -- -- --   Estimated Creatinine Clearance: 72 ml/min (by C-G formula based on Cr of 0.91).  Medical History: Past Medical History  Diagnosis Date  . Hypertension   . Hyperlipidemia   . AAA (abdominal aortic aneurysm)   . Atrial fibrillation   . BPH (benign prostatic hypertrophy)   . Encounter for long-term (current) use of anticoagulants   . Cancer     skin cancer ( Basal cell)    Medications:  Scheduled:     . cefTRIAXone (ROCEPHIN)  IV  1 g Intravenous Q24H  . finasteride  5 mg Oral Daily  . Flora-Q  1 capsule Oral Daily  . lisinopril  20 mg Oral Daily  . metoprolol  50 mg Oral BID  . potassium chloride  20 mEq Oral Daily  . simvastatin  40 mg Oral QHS  . terazosin  10 mg Oral QHS  . warfarin  10 mg Oral ONCE-1800  . DISCONTD: oseltamivir  75 mg Oral BID   Patient's usual warfarin dosage is reportedly 7.5mg  Tues, Thurs, Sat, Sun;   5mg  Mon, Wed, Fri.      Assessment: -77 year old male on Coumadin PTA for atrial fibrillation -INR responding after warfarin 10mg  booster doses daily x2   Goal of Therapy:  INR 2-3   Plan:  1.  Warfarin 5mg  PO today as per patient's usual regimen 2.  Continue daily PT /  INR  Elie Goody, PharmD  [phone] 05/04/2011 10:14 AM

## 2011-05-04 NOTE — Progress Notes (Signed)
Occupational Therapy Evaluation Patient Details Name: Chad Buchanan MRN: [id number] DOB: August 18, 1920 Today's Date: 05/04/2011  Problem List:  Patient Active Problem List  Diagnoses  . Abdominal aneurysm without mention of rupture  . Fever and chills  . Hypoxia  . Atrial fibrillation  . Coronary atherosclerosis  . Impaired fasting glucose  . Unspecified hearing loss  . Unspecified essential hypertension  . Right hip pain  . AAA (abdominal aortic aneurysm) without rupture  . Other and unspecified hyperlipidemia  . Hypokalemia    Past Medical History:  Past Medical History  Diagnosis Date  . Hypertension   . Hyperlipidemia   . AAA (abdominal aortic aneurysm)   . Atrial fibrillation   . BPH (benign prostatic hypertrophy)   . Encounter for long-term (current) use of anticoagulants   . Cancer     skin cancer ( Basal cell)   Past Surgical History:  Past Surgical History  Procedure Date  . Abdominal aortic aneurysm repair     OT Assessment/Plan/Recommendation OT Assessment Clinical Impression Statement: Pt demos a decline in function in balance, safety and activity tolerance for ADLs and ADL mobility. Pt would benefit from skilled OT services to address these impairments to increase independence and maximize level of function to return home safely OT Recommendation/Assessment: Patient will need skilled OT in the acute care venue OT Problem List: Decreased activity tolerance;Impaired balance (sitting and/or standing);Decreased knowledge of use of DME or AE;Decreased knowledge of precautions;Decreased safety awareness Barriers to Discharge: Decreased caregiver support Barriers to Discharge Comments: Pt's spouse concerned about her ability to provide assist prn at home OT Therapy Diagnosis : Generalized weakness OT Plan OT Frequency: Min 2X/week OT Treatment/Interventions: Self-care/ADL training;Therapeutic activities;Therapeutic exercise;Neuromuscular education;Energy  conservation;DME and/or AE instruction;Patient/family education;Balance training OT Recommendation Follow Up Recommendations: Home health OT Equipment Recommended: Tub/shower bench Individuals Consulted Consulted and Agree with Results and Recommendations: Patient;Family member/caregiver Family Member Consulted: Pt' s wife and son OT Goals Acute Rehab OT Goals OT Goal Formulation: With patient Time For Goal Achievement: 7 days ADL Goals Pt Will Perform Grooming: with set-up;with supervision;Standing at sink Pt Will Perform Lower Body Bathing: with min assist;Sitting in shower;Sitting at sink Pt Will Perform Lower Body Dressing: with min assist;Sitting, chair;Sit to stand from chair Pt Will Transfer to Toilet: with supervision;with DME;Grab bars Pt Will Perform Toileting - Clothing Manipulation: with supervision;Standing  OT Evaluation Precautions/Restrictions  Precautions Precautions: Fall Restrictions Weight Bearing Restrictions: No Prior Functioning Home Living Lives With: Spouse Receives Help From: Family Type of Home: House Home Layout: Multi-level;Full bath on main level;Able to live on main level with bedroom/bathroom Home Access: Stairs to enter Entrance Stairs-Number of Steps: 1 step Bathroom Shower/Tub: Engineer, manufacturing systems: Standard Home Adaptive Equipment: Bedside commode/3-in-1;Straight cane;Walker - rolling Prior Function Level of Independence: Independent with basic ADLs;Independent with transfers;Requires assistive device for independence Driving: Yes Vocation: Retired ADL ADL Eating/Feeding: Not assessed Grooming: Performed;Wash/dry hands;Wash/dry face;Brushing hair (min guard assist with cues for safety) Where Assessed - Grooming: Standing at sink Upper Body Bathing: Simulated;Set up;Supervision/safety Where Assessed - Upper Body Bathing: Sitting, bed Lower Body Bathing: Simulated;Moderate assistance Where Assessed - Lower Body Bathing:  Sitting, bed Upper Body Dressing: Performed;Set up;Supervision/safety Where Assessed - Upper Body Dressing: Sitting, bed Lower Body Dressing: Moderate assistance Lower Body Dressing Details (indicate cue type and reason): to doff/donn socks Where Assessed - Lower Body Dressing: Sitting, chair Toilet Transfer: Performed (Min guard assist, verbal cues for safety) Toileting - Clothing Manipulation: Performed (Min guard assist) Where Assessed -  Toileting Clothing Manipulation: Standing Toileting - Hygiene: Not assessed Tub/Shower Transfer: Not assessed Vision/Perception  Vision - History Baseline Vision: Wears glasses only for reading Patient Visual Report: No change from baseline Perception Perception: Within Functional Limits Cognition Cognition Arousal/Alertness: Awake/alert Overall Cognitive Status: Appears within functional limits for tasks assessed Orientation Level: Oriented X4 Cognition - Other Comments: HOH Sensation/Coordination Sensation Light Touch: Appears Intact Coordination Gross Motor Movements are Fluid and Coordinated: Yes Fine Motor Movements are Fluid and Coordinated: Yes Extremity Assessment RUE Assessment RUE Assessment: Within Functional Limits (MMT 3+/5) LUE Assessment LUE Assessment: Within Functional Limits (MMT 4-/5) Mobility  Bed Mobility Bed Mobility: Yes Supine to Sit: 4: Min assist Supine to Sit Details (indicate cue type and reason): VCs safety, technique. Increased time. Assist for trunk to upright. Transfers Transfers: Yes Sit to Stand: 3: Mod assist;4: Min assist Sit to Stand Details (indicate cue type and reason): 2 attempts to rise. Pt uses momentum to transition. Assist to rise, stabilize. VCs safety, technique, hand placement. Stand to Sit Details: Min guard assist with verbal cues fro safety using RW, correct hand placement   End of Session OT - End of Session Equipment Utilized During Treatment: Gait belt;Other (comment) (3 in 1 over  toilet, RW) Activity Tolerance: Patient tolerated treatment well Patient left: in chair;with call bell in reach;with family/visitor present Nurse Communication: Other (comment) (O2 SATs at 92% after activity) General Behavior During Session: [facility] for tasks performed Cognition: [facility] for tasks performed   Galen Manila 05/04/2011, 11:56 AM

## 2011-05-05 LAB — COMPREHENSIVE METABOLIC PANEL
BUN: 19 mg/dL (ref 6–23)
CO2: 26 mEq/L (ref 19–32)
Calcium: 9.3 mg/dL (ref 8.4–10.5)
Chloride: 104 mEq/L (ref 96–112)
Creatinine, Ser: 0.85 mg/dL (ref 0.50–1.35)
GFR calc Af Amer: 88 mL/min — ABNORMAL LOW (ref >90)
GFR calc non Af Amer: 76 mL/min — ABNORMAL LOW (ref >90)
Glucose, Bld: 128 mg/dL — ABNORMAL HIGH (ref 70–99)
Total Bilirubin: 0.7 mg/dL (ref 0.3–1.2)

## 2011-05-05 LAB — CBC
HCT: 34.1 % — ABNORMAL LOW (ref 39.0–52.0)
MCH: 27.9 pg (ref 26.0–34.0)
MCV: 84.8 fL (ref 78.0–100.0)
Platelets: 118 10*3/uL — ABNORMAL LOW (ref 150–400)
RBC: 4.02 MIL/uL — ABNORMAL LOW (ref 4.22–5.81)

## 2011-05-05 MED ORDER — NYSTATIN 100000 UNIT/ML MT SUSP
5.0000 mL | Freq: Four times a day (QID) | OROMUCOSAL | Status: DC
  Administered 2011-05-05 – 2011-05-07 (×8): 500000 [IU] via ORAL
  Filled 2011-05-05 (×11): qty 5

## 2011-05-05 MED ORDER — WARFARIN SODIUM 5 MG PO TABS
5.0000 mg | ORAL_TABLET | Freq: Once | ORAL | Status: AC
  Administered 2011-05-05: 5 mg via ORAL
  Filled 2011-05-05: qty 1

## 2011-05-05 MED ORDER — LEVALBUTEROL HCL 0.63 MG/3ML IN NEBU
0.6300 mg | INHALATION_SOLUTION | RESPIRATORY_TRACT | Status: DC | PRN
  Filled 2011-05-05: qty 3

## 2011-05-05 MED ORDER — IPRATROPIUM BROMIDE 0.02 % IN SOLN
0.5000 mg | Freq: Four times a day (QID) | RESPIRATORY_TRACT | Status: DC
  Administered 2011-05-05 – 2011-05-07 (×7): 0.5 mg via RESPIRATORY_TRACT
  Filled 2011-05-05 (×7): qty 2.5

## 2011-05-05 MED ORDER — PREDNISONE 20 MG PO TABS
30.0000 mg | ORAL_TABLET | Freq: Every day | ORAL | Status: DC
  Administered 2011-05-06 – 2011-05-07 (×2): 30 mg via ORAL
  Filled 2011-05-05 (×2): qty 1

## 2011-05-05 MED ORDER — LEVALBUTEROL HCL 0.63 MG/3ML IN NEBU
0.6300 mg | INHALATION_SOLUTION | Freq: Four times a day (QID) | RESPIRATORY_TRACT | Status: DC
  Administered 2011-05-05 – 2011-05-07 (×7): 0.63 mg via RESPIRATORY_TRACT
  Filled 2011-05-05 (×12): qty 3

## 2011-05-05 NOTE — Progress Notes (Signed)
Patient ID: Chad Buchanan, male   DOB: 1921/02/26, 77 y.o.   MRN: [id number] Subjective: Still sob but doing some better. Walked with walker in hall. Still needing oxygen.  No pain.  The leg pain is better (advised outpatient chiropractic therapy later if that recurs).  Objective: Vital signs in last 24 hours: Temp:  [98 F (36.7 C)-98.4 F (36.9 C)] 98 F (36.7 C) (12/11 0536) Pulse Rate:  [72-89] 72  (12/11 0536) Resp:  [18-22] 20  (12/11 0536) BP: (125-138)/(76-87) 138/86 mmHg (12/11 0536) SpO2:  [93 %-97 %] 95 % (12/11 0853) Weight change:  Last BM Date: 05/02/11  Intake/Output from previous day: 12/10 0701 - 12/11 0700 In: 480 [P.O.:480] Out: 1000 [Urine:1000] Intake/Output this shift: Total I/O In: 480 [P.O.:480] Out: -   General appearance: alert, cooperative and appears stated age Resp: decreased breath sounds bilaterally, insp. wheeze bilaterally, no rhonchi or rales. Cardio: regular rate and rhythm  2/6 sem left and rsb, no rub or gallop. GI: soft, non-tender; bowel sounds normal; no masses,  no organomegaly Extremities: extremities normal, atraumatic, no cyanosis or edema Neurologic: Grossly normal   Lab Results:  Basename 05/05/11 0530 05/04/11 0532  WBC 8.2 8.8  HGB 11.2* 10.8*  HCT 34.1* 33.7*  PLT 118* 100*   BMET  Basename 05/05/11 0530 05/04/11 0532  NA 137 136  K 3.5 3.6  CL 104 105  CO2 26 25  GLUCOSE 128* 111*  BUN 19 18  CREATININE 0.85 0.91  CALCIUM 9.3 9.1   CMET CMP     Component Value Date/Time   NA 137 05/05/2011 0530   K 3.5 05/05/2011 0530   CL 104 05/05/2011 0530   CO2 26 05/05/2011 0530   GLUCOSE 128* 05/05/2011 0530   BUN 19 05/05/2011 0530   CREATININE 0.85 05/05/2011 0530   CREATININE 0.97 03/13/2011 1123   CALCIUM 9.3 05/05/2011 0530   PROT 5.5* 05/05/2011 0530   ALBUMIN 2.3* 05/05/2011 0530   AST 32 05/05/2011 0530   ALT 28 05/05/2011 0530   ALKPHOS 83 05/05/2011 0530   BILITOT 0.7 05/05/2011 0530   GFRNONAA  76* 05/05/2011 0530   GFRAA 88* 05/05/2011 0530   Lab Results  Component Value Date   INR 2.46* 05/05/2011   INR 1.96* 05/04/2011   INR 1.65* 05/03/2011     Studies/Results: Dg Chest 2 View  05/04/2011  *RADIOLOGY REPORT*  Clinical Data: Pneumonia.  Hypertension.  Atrial fibrillation.  CHEST - 2 VIEW  Comparison: 05/02/2011 and 09/25/2009 and 09/23/2009  Findings: The interstitial markings have increased since the prior study with increased peribronchial thickening.  Mild cardiomegaly is unchanged.  Vascularity is slightly more prominent.  IMPRESSION: Findings consistent with progressive pneumonitis.  Original Report Authenticated By: Gwynn Burly, M.D.    Medications: I have reviewed the patient's current medications.  Assessment/Plan:  Principal Problem:  *Fever and chills-improving.  Continue rocephin another day or two then finish up with augmentin. Active Problems:  Hypoxia-continue oxygen  Atrial fibrillation-follow.  anticoag is appropriate.  Coronary atherosclerosis-follow  Impaired fasting glucose-follow  Unspecified hearing loss-follow  Unspecified essential hypertension-stable  Right hip pain-improved. i think more of a pinched nerve from low back  AAA (abdominal aortic aneurysm) without rupture-follow  Other and unspecified hyperlipidemia-follow  Hypokalemia-better He has bronchospasm.  Add prednisone and plan on a one week taper.  Add nystatin mw to prevent oral thrush.  Hopefully home with home health rn/pt/ot  Maybe tomorrow but more likely on Thursday. Family will also  possibly arrange some extra help in the home.   LOS: 3 days   Ezequiel Kayser, MD 05/05/2011, 12:22 PM

## 2011-05-05 NOTE — Progress Notes (Signed)
Pt walked <40 feet tolerated well with assist and a walker. Pt sats were 93%. Pt still weak and unsteady but was up with assistance.  William Dalton Lajoi 05/05/2011 10:25 AM

## 2011-05-05 NOTE — Progress Notes (Signed)
05/05/2011, Kathi Der RNC-MNN, BSN.  CM received referral.  CM spoke to pt and family-wife, daughter, and daughter-in-law.  Presented choice for [facility] services.  Pt and family have selected [facility].  HH services ordered-PT/OT,tub bench.  Family has requested [facility] aide as well.  Wife will be at home when pt discharged, but feel needs additional help.  Family given list of private duty sitters in case insurance will not cover [facility] aide at their request.  Will follow.  [phone]

## 2011-05-05 NOTE — Progress Notes (Signed)
ANTICOAGULATION CONSULT NOTE - Follow up Consult  Pharmacy Consult for Warfarin Indication: Atrial fibrillation  Allergies  Allergen Reactions  . Oxycontin   . Ultram (Tramadol Hcl)     oversedation    Patient Measurements: Weight = 110 kg (on 03/25/11) Height = 73 inches   Vital Signs: Temp: 98 F (36.7 C) (12/11 0536) Temp src: Oral (12/11 0536) BP: 138/86 mmHg (12/11 0536) Pulse Rate: 72  (12/11 0536)  Labs:  Basename 05/05/11 0530 05/04/11 0532 05/03/11 0730  HGB 11.2* 10.8* --  HCT 34.1* 33.7* 35.6*  PLT 118* 100* 92*  APTT -- -- --  LABPROT 27.1* 22.7* 19.8*  INR 2.46* 1.96* 1.65*  HEPARINUNFRC -- -- --  CREATININE 0.85 0.91 0.91  CKTOTAL -- -- --  CKMB -- -- --  TROPONINI -- -- --   Estimated Creatinine Clearance: 77.1 ml/min (by C-G formula based on Cr of 0.85).  Medical History: Past Medical History  Diagnosis Date  . Hypertension   . Hyperlipidemia   . AAA (abdominal aortic aneurysm)   . Atrial fibrillation   . BPH (benign prostatic hypertrophy)   . Encounter for long-term (current) use of anticoagulants   . Cancer     skin cancer ( Basal cell)    Medications:  Scheduled:     . cefTRIAXone (ROCEPHIN)  IV  1 g Intravenous Q24H  . finasteride  5 mg Oral Daily  . Flora-Q  1 capsule Oral Daily  . ipratropium  0.5 mg Nebulization Q6H  . levalbuterol  0.63 mg Nebulization Q6H  . lisinopril  20 mg Oral Daily  . metoprolol  50 mg Oral BID  . potassium chloride  20 mEq Oral Daily  . simvastatin  40 mg Oral QHS  . terazosin  10 mg Oral QHS  . warfarin  5 mg Oral ONCE-1800   Patient's usual warfarin dosage is reportedly 7.5mg  Tues, Thurs, Sat, Sun;   5mg  Mon, Wed, Fri.    Assessment: -77 year old male on Coumadin PTA for atrial fibrillation -INR therapeutic after warfarin 10mg  booster doses daily 12/8-12/9, 5mg  12/10 per home regimen -INR has risen significantly today, therefore will back off slightly from home dose   Goal of Therapy:    INR 2-3   Plan:  1.  Warfarin 5mg  PO today  2.  Continue daily PT / INR  Rollene Fare Pager: [phone] 05/05/2011 8:25 AM

## 2011-05-06 LAB — COMPREHENSIVE METABOLIC PANEL
AST: 42 U/L — ABNORMAL HIGH (ref 0–37)
Albumin: 2.2 g/dL — ABNORMAL LOW (ref 3.5–5.2)
Alkaline Phosphatase: 92 U/L (ref 39–117)
BUN: 15 mg/dL (ref 6–23)
Chloride: 104 mEq/L (ref 96–112)
Potassium: 3.3 mEq/L — ABNORMAL LOW (ref 3.5–5.1)
Total Bilirubin: 0.3 mg/dL (ref 0.3–1.2)
Total Protein: 5.5 g/dL — ABNORMAL LOW (ref 6.0–8.3)

## 2011-05-06 LAB — CBC
MCHC: 32.5 g/dL (ref 30.0–36.0)
Platelets: 136 10*3/uL — ABNORMAL LOW (ref 150–400)
RDW: 14.9 % (ref 11.5–15.5)
WBC: 7.2 10*3/uL (ref 4.0–10.5)

## 2011-05-06 LAB — PROTIME-INR
INR: 2.79 — ABNORMAL HIGH (ref 0.00–1.49)
Prothrombin Time: 29.9 seconds — ABNORMAL HIGH (ref 11.6–15.2)

## 2011-05-06 MED ORDER — PANTOPRAZOLE SODIUM 40 MG PO TBEC
40.0000 mg | DELAYED_RELEASE_TABLET | Freq: Every day | ORAL | Status: DC
  Administered 2011-05-06 – 2011-05-07 (×2): 40 mg via ORAL
  Filled 2011-05-06 (×2): qty 1

## 2011-05-06 MED ORDER — WARFARIN SODIUM 5 MG PO TABS
5.0000 mg | ORAL_TABLET | Freq: Once | ORAL | Status: AC
  Administered 2011-05-06: 5 mg via ORAL
  Filled 2011-05-06: qty 1

## 2011-05-06 NOTE — Clinical Documentation Improvement (Signed)
GENERIC DOCUMENTATION CLARIFICATION QUERY  THIS DOCUMENT IS NOT A PERMANENT PART OF THE MEDICAL RECORD  TO RESPOND TO THE THIS QUERY, FOLLOW THE INSTRUCTIONS BELOW:  1. If needed, update documentation for the patient's encounter via the notes activity.  2. Access this query again and click edit on the [employer].  3. After updating, or not, click F2 to complete all highlighted (required) fields concerning your review. Select "additional documentation in the medical record" OR "no additional documentation provided".  4. Click Sign note button.  5. The deficiency will fall out of your InBasket *Please let us know if you are not able to compete this workflow by phone or e-mail (listed below).  Please update your documentation within the medical record to reflect your response to this query.                                                                                        05/06/11   Dear Dr. Waynard Edwards / Associates,  In a better effort to capture your patient's severity of illness, reflect appropriate length of stay and utilization of resources, a review of the patient medical record has revealed the following indicators.    Pt admitted with symptoms  diagnosis of fever, chills, fatigue, anorexia. According to progress notes: Principal Problem:*Fever and chills-  Continue rocephin another day or two then finish up with augmentin  Based on your clinical judgment, please clarify and document in a progress note and/or discharge summary the clinical condition associated with the following supporting information:  In responding to this query please exercise your independent judgment.  The fact that a query is asked, does not imply that any particular answer is desired or expected..    Please clarify/validate  on progress notes and d/c summary if symptoms above relates to a diagnosis, medical condition or a possible /probable condition that may be causing the symptom.Please document  Whether  or not  "fever and chills" can be further specified as any of the conditions below. Thank you.   Possible Clinical Conditions? __Acute Bronchitis __Interstitial Lung Disease  __ pneumonitis    _______Other Condition _______Cannot Clinically Determine   Supporting Information:  LABS:NA 134*  Basename  05/03/11 0730  05/02/11 1730   WBC  10.7*  12.8*    Progress Notes:  Resp: He does have a few rhonchi and adventitious sounds   not hypotensive at the present time and has no evidence of sepsis.  CHEST  VIEW: Cardiomegaly without failure. 2.  Low lung volumes and mild bibasilar atelectasis. 3.  Mild interstitial coarsening is likely chronic                 05/04/11 Findings consistent with progressive pneumonitis  Principal Problem:  *Fever and chills-  Continue rocephin another day or two then finish up with augmentin. Admitting temp 101.8 pulse 104    You may use possible, probable, or suspect with inpatient documentation. possible, probable, suspected diagnoses MUST be documented at the time of discharge  Reviewed: additional documentation in the medical record  Thank You,  Andy Gauss RN  Clinical Documentation Specialist:  Pager [phone]  Health Information  Management [place]

## 2011-05-06 NOTE — Progress Notes (Signed)
Physical Therapy Treatment Patient Details Name: Chad Buchanan MRN: [id number] DOB: 02-18-1921 Today's Date: 05/06/2011 11;15 - 11:50 1 gt  1 ta PT Assessment/Plan  PT - Assessment/Plan Comments on Treatment Session: Pt feeling better but still c/o mild SOB with act PT Plan: Discharge plan remains appropriate PT Frequency: Min 3X/week Follow Up Recommendations: Home health PT Equipment Recommended: 3 in 1 bedside comode PT Goals  Acute Rehab PT Goals PT Goal Formulation: With patient Pt will go Supine/Side to Sit: with supervision PT Goal: Supine/Side to Sit - Progress: Progressing toward goal Pt will go Sit to Supine/Side: with supervision PT Goal: Sit to Supine/Side - Progress: Progressing toward goal Pt will go Sit to Stand: with supervision PT Goal: Sit to Stand - Progress: Progressing toward goal Pt will go Stand to Sit: with supervision PT Goal: Stand to Sit - Progress: Progressing toward goal Pt will Ambulate: 51 - 150 feet;with supervision;with least restrictive assistive device PT Goal: Ambulate - Progress: Progressing toward goal Pt will Go Up / Down Stairs: 1-2 stairs;with least restrictive assistive device PT Goal: Up/Down Stairs - Progress: Progressing toward goal  PT Treatment Precautions/Restrictions  Precautions Precautions: Fall Precaution Comments: monitor O2 Restrictions Weight Bearing Restrictions: No Mobility (including Balance) Bed Mobility Bed Mobility: Yes Supine to Sit: 4: Min assist Transfers Transfers: Yes Sit to Stand: 4: Min assist Sit to Stand Details (indicate cue type and reason): 25% VC's to push up from bed vs walker Stand to Sit: 4: Min assist Stand to Sit Details: 25% for handplacement and safety  Ambulation/Gait Ambulation/Gait: Yes Ambulation/Gait Assistance: 4: Min assist Ambulation/Gait Assistance Details (indicate cue type and reason): RA O2 lowest 88% which increased to 96% with VC's on breathing Ambulation Distance (Feet):  175 Feet Assistive device: Rolling walker Gait Pattern: Step-through pattern;Trunk flexed Stairs: No Wheelchair Mobility Wheelchair Mobility: No    Exercise    End of Session PT - End of Session Equipment Utilized During Treatment: Gait belt Activity Tolerance: Patient tolerated treatment well Patient left: in chair;with call bell in reach;with family/visitor present General Behavior During Session: [facility] for tasks performed Cognition: [facility] for tasks performed  Felecia Shelling PTA [facility]  Acute  Rehab Pager     [phone]

## 2011-05-06 NOTE — Clinical Documentation Improvement (Signed)
RESPIRATORY FAILURE DOCUMENTATION CLARIFICATION QUERY   THIS DOCUMENT IS NOT A PERMANENT PART OF THE MEDICAL RECORD  TO RESPOND TO THE THIS QUERY, FOLLOW THE INSTRUCTIONS BELOW:  1. If needed, update documentation for the patient's encounter via the notes activity.  2. Access this query again and click edit on the [employer].  3. After updating, or not, click F2 to complete all highlighted (required) fields concerning your review. Select "additional documentation in the medical record" OR "no additional documentation provided".  4. Click Sign note button.  5. The deficiency will fall out of your InBasket *Please let us know if you are not able to compete this workflow by phone or e-mail (listed below).  Please update your documentation within the medical record to reflect your response to this query.                                                                                    05/06/11  Dear Dr.Valeta Paz Marton Redwood,  In a better effort to capture your patient's severity of illness, reflect appropriate length of stay and utilization of resources, a review of the patient medical record has revealed the following indicators.    Pt noted in progress notes to be hypoxic. Marland Kitchen Based on your clinical judgment, please clarify and document in a progress note and/or discharge summary the clinical condition associated with the following supporting information:  In responding to this query please exercise your independent judgment.  The fact that a query is asked, does not imply that any particular answer is desired or expected.  Please clarify in progress notes and d/c summary whether or not  "hypoxia" can be further specified  By one of the diagnosis listed below. Thank you.  Possible Clinical Conditions?  _______Acute Respiratory Failure _______Acute on Chronic Respiratory Failure _______Chronic Respiratory Failure  _______Other Condition________________ _______Cannot Clinically  Determine    Supporting Information:  Risk Factors:Obese, HTN,  AAA  Signs&Symptoms: 05/06/11 note:    still has significant abnormal lung exam with wheeze and decreased breath sounds                          Resp: diminished breath sounds bilaterally 05/04/11 note  Problem list:    Hypoxia-try to wean oxygen.  Radiology: CHEST 2 VIEW 12/81/12 : IMPRESSION:  1.  Cardiomegaly without failure. 2.  Low lung volumes and mild bibasilar atelectasis. 3.  Mild interstitial coarsening is likely chronic   H&P notes:  Resp: lungs he has some dry crackles on the right more than left ED notes:  seems to be some short of breath when he walks   Treatment: Oxygen: 2L/[place] , Lasix, Deltasone,  Lopressor, Lisinopril                     You may use possible, probable, or suspect with inpatient documentation. possible, probable, suspected diagnoses MUST be documented at the time of discharge  Reviewed: additional documentation in the medical record  Thank You,  Andy Gauss RN   Clinical Documentation Specialist:  Pager [phone]  Health Information Management [place]   He dropped to 88% while walking  on Oxygen the day prior to discharge in the hallway and he continues to have wheeze and some dyspnea so i am sending him home on oxygen , hopefully to stop it in 3-4 days when we see him back in the office.

## 2011-05-06 NOTE — Progress Notes (Signed)
ANTICOAGULATION CONSULT NOTE - Follow up Consult  Pharmacy Consult for Warfarin Indication: Atrial fibrillation  Allergies  Allergen Reactions  . Oxycontin   . Ultram (Tramadol Hcl)     oversedation    Patient Measurements: Weight = 110 kg (on 03/25/11) Height = 73 inches   Vital Signs: Temp: 98.2 F (36.8 C) (12/12 0545) Temp src: Oral (12/12 0545) BP: 135/53 mmHg (12/12 0545) Pulse Rate: 87  (12/12 0545)  Labs:  Basename 05/06/11 0458 05/05/11 0530 05/04/11 0532  HGB 10.6* 11.2* --  HCT 32.6* 34.1* 33.7*  PLT 136* 118* 100*  APTT -- -- --  LABPROT 29.9* 27.1* 22.7*  INR 2.79* 2.46* 1.96*  HEPARINUNFRC -- -- --  CREATININE 0.87 0.85 0.91  CKTOTAL -- -- --  CKMB -- -- --  TROPONINI -- -- --   Estimated Creatinine Clearance: 75.3 ml/min (by C-G formula based on Cr of 0.87).  Medical History: Past Medical History  Diagnosis Date  . Hypertension   . Hyperlipidemia   . AAA (abdominal aortic aneurysm)   . Atrial fibrillation   . BPH (benign prostatic hypertrophy)   . Encounter for long-term (current) use of anticoagulants   . Cancer     skin cancer ( Basal cell)    Medications:  Scheduled:     . cefTRIAXone (ROCEPHIN)  IV  1 g Intravenous Q24H  . finasteride  5 mg Oral Daily  . Flora-Q  1 capsule Oral Daily  . ipratropium  0.5 mg Nebulization Q6H  . levalbuterol  0.63 mg Nebulization Q6H  . lisinopril  20 mg Oral Daily  . metoprolol  50 mg Oral BID  . nystatin  5 mL Oral QID  . pantoprazole  40 mg Oral Q1200  . potassium chloride  20 mEq Oral Daily  . predniSONE  30 mg Oral Q breakfast  . simvastatin  40 mg Oral QHS  . terazosin  10 mg Oral QHS  . warfarin  5 mg Oral ONCE-1800   Patient's usual warfarin dosage is reportedly 7.5mg  Tues, Thurs, Sat, Sun; 5mg  Mon, Wed, Fri.    Assessment: -77 year old male on Coumadin PTA for atrial fibrillation -INR therapeutic and rising steadily perhaps from decreased po intake?  INR was subtherapeutic on  admission. -No major drug interactions indentified -Continue home dose today, will consider decreasing dose tomorrow if INR continues to rise.  Goal of Therapy:  INR 2-3   Plan:  1.  Warfarin 5mg  PO today as at home 2.  Continue daily PT / INR  Rollene Fare Pager: [phone] 05/06/2011 9:41 AM

## 2011-05-06 NOTE — Progress Notes (Addendum)
Patient ID: Chad Buchanan, male   DOB: 08/15/1920, 77 y.o.   MRN: [id number] Subjective: Feels about the same. Still stopped up in the head.  It is a miserable existence he says. No pain. Not more SOB.  Objective: Vital signs in last 24 hours: Temp:  [97.9 F (36.6 C)-98.2 F (36.8 C)] 98.2 F (36.8 C) (12/12 0545) Pulse Rate:  [68-97] 87  (12/12 0545) Resp:  [18-20] 20  (12/12 0545) BP: (113-186)/(53-82) 135/53 mmHg (12/12 0545) SpO2:  [93 %-98 %] 98 % (12/12 0545) Weight change:  Last BM Date: 05/05/11  Intake/Output from previous day: 12/11 0701 - 12/12 0700 In: 480 [P.O.:480] Out: 400 [Urine:400] Intake/Output this shift:    General appearance: alert and cooperative Resp: diminished breath sounds bilaterally Cardio: regular rate and rhythm, S1, S2 normal, no murmur, click, rub or gallop GI: soft, non-tender; bowel sounds normal; no masses,  no organomegaly Extremities: extremities normal, atraumatic, no cyanosis or edema Neurologic: Grossly normal Still some insp. Wheeze bilat. But air movement is a bit better today.  Lab Results:  Basename 05/06/11 0458 05/05/11 0530  WBC 7.2 8.2  HGB 10.6* 11.2*  HCT 32.6* 34.1*  PLT 136* 118*   BMET  Basename 05/06/11 0458 05/05/11 0530  NA 137 137  K 3.3* 3.5  CL 104 104  CO2 27 26  GLUCOSE 121* 128*  BUN 15 19  CREATININE 0.87 0.85  CALCIUM 8.9 9.3   CMET CMP     Component Value Date/Time   NA 137 05/06/2011 0458   K 3.3* 05/06/2011 0458   CL 104 05/06/2011 0458   CO2 27 05/06/2011 0458   GLUCOSE 121* 05/06/2011 0458   BUN 15 05/06/2011 0458   CREATININE 0.87 05/06/2011 0458   CREATININE 0.97 03/13/2011 1123   CALCIUM 8.9 05/06/2011 0458   PROT 5.5* 05/06/2011 0458   ALBUMIN 2.2* 05/06/2011 0458   AST 42* 05/06/2011 0458   ALT 40 05/06/2011 0458   ALKPHOS 92 05/06/2011 0458   BILITOT 0.3 05/06/2011 0458   GFRNONAA 75* 05/06/2011 0458   GFRAA 87* 05/06/2011 0458   Lab Results  Component Value Date   INR 2.79* 05/06/2011   INR 2.46* 05/05/2011   INR 1.96* 05/04/2011     Studies/Results: Dg Chest 2 View  05/04/2011  *RADIOLOGY REPORT*  Clinical Data: Pneumonia.  Hypertension.  Atrial fibrillation.  CHEST - 2 VIEW  Comparison: 05/02/2011 and 09/25/2009 and 09/23/2009  Findings: The interstitial markings have increased since the prior study with increased peribronchial thickening.  Mild cardiomegaly is unchanged.  Vascularity is slightly more prominent.  IMPRESSION: Findings consistent with progressive pneumonitis.  Original Report Authenticated By: Gwynn Burly, M.D.    Medications: I have reviewed the patient's current medications.  Assessment/Plan:  Principal Problem:  *Fever and chills-improving. Treating bronchospasm with prednisone and nebs. Cont rocephin. Active Problems:  Hypoxia-on oxygen  Atrial fibrillation-follow  Coronary atherosclerosis-follow  Impaired fasting glucose-follow  Unspecified hearing loss-follow  Unspecified essential hypertension-stable.  Right hip pain-doing better for some reason.  AAA (abdominal aortic aneurysm) without rupture-follow  Other and unspecified hyperlipidemia-follow  Hypokalemia-replace. Hopefully home in a day or two but he is a bit slow to improve and still has significant abnormal lung exam with wheeze and decreased breath sounds.  With combo of prednisone, potassium and warfarin I will add protonix for a time to try prevent gastritis or risk of gi bleeding.   LOS: 4 days   Ezequiel Kayser, MD 05/06/2011, 7:36 AM

## 2011-05-07 LAB — COMPREHENSIVE METABOLIC PANEL
ALT: 35 U/L (ref 0–53)
Alkaline Phosphatase: 89 U/L (ref 39–117)
BUN: 15 mg/dL (ref 6–23)
CO2: 30 mEq/L (ref 19–32)
GFR calc Af Amer: 86 mL/min — ABNORMAL LOW (ref >90)
GFR calc non Af Amer: 74 mL/min — ABNORMAL LOW (ref >90)
Glucose, Bld: 149 mg/dL — ABNORMAL HIGH (ref 70–99)
Potassium: 3.6 mEq/L (ref 3.5–5.1)
Sodium: 139 mEq/L (ref 135–145)
Total Protein: 5.4 g/dL — ABNORMAL LOW (ref 6.0–8.3)

## 2011-05-07 LAB — PROTIME-INR
INR: 3.63 — ABNORMAL HIGH (ref 0.00–1.49)
Prothrombin Time: 36.7 seconds — ABNORMAL HIGH (ref 11.6–15.2)

## 2011-05-07 LAB — CBC
HCT: 32.4 % — ABNORMAL LOW (ref 39.0–52.0)
Hemoglobin: 10.4 g/dL — ABNORMAL LOW (ref 13.0–17.0)
MCH: 27.4 pg (ref 26.0–34.0)
MCHC: 32.1 g/dL (ref 30.0–36.0)
RBC: 3.8 MIL/uL — ABNORMAL LOW (ref 4.22–5.81)

## 2011-05-07 MED ORDER — PREDNISONE 10 MG PO TABS: 30.0000 mg | ORAL_TABLET | Freq: Every day | ORAL | Status: AC

## 2011-05-07 MED ORDER — WARFARIN SODIUM 5 MG PO TABS: 5.0000 mg | ORAL_TABLET | Freq: Every day | ORAL | Status: DC

## 2011-05-07 MED ORDER — NYSTATIN 100000 UNIT/ML MT SUSP: 5.0000 mL | Freq: Four times a day (QID) | OROMUCOSAL | Status: AC

## 2011-05-07 MED ORDER — AMOXICILLIN-POT CLAVULANATE 875-125 MG PO TABS: 1.0000 | ORAL_TABLET | Freq: Two times a day (BID) | ORAL | Status: AC

## 2011-05-07 MED ORDER — FLORA-Q PO CAPS: 1.0000 | ORAL_CAPSULE | Freq: Every day | ORAL | Status: DC

## 2011-05-07 NOTE — Progress Notes (Signed)
3N1 and Oxygen added to existing orders for DME.  Lucretia at [facility] made aware.

## 2011-05-07 NOTE — Discharge Summary (Signed)
Physician Discharge Summary  Patient ID: Chad Buchanan MRN: [id number] DOB/AGE: November 18, 1920 77 y.o.  Admit date: 05/02/2011 Discharge date: 05/07/2011  Admission Diagnoses:  Discharge Diagnoses:  Principal Problem:  *Fever and chills Active Problems:  Hypoxia  Atrial fibrillation  Coronary atherosclerosis  Impaired fasting glucose  Unspecified hearing loss  Unspecified essential hypertension  Right hip pain  AAA (abdominal aortic aneurysm) without rupture  Other and unspecified hyperlipidemia  Hypokalemia   Discharged Condition: fair  Hospital Course: Chad Buchanan was admitted with bronchitis and dyspnea.  He had some lethargy and cough and wheezing. He was treated with rocephin and nebulizer treatments.  He was continued on his coumadin for atrial fibrillation and remained stable from a cardiac standpoint during this stay.  He gradually improved but did have significant wheezing and reduced airflow on exam and was placed on prednisone therapy.  He worked with physical therapy.  By 05/07/11 he was deemed stable for discharge home.  Consults: none  Significant Diagnostic Studies: lab studies, chest xray  Treatments: antibiotics: ceftriaxone  Discharge Exam:  Blood pressure 118/68, pulse 64, temperature 97.7 F (36.5 C), temperature source Oral, resp. rate 18, height 6\' 1"  (1.854 m), weight 107.004 kg (235 lb 14.4 oz), SpO2 100.00%.  Physical Exam:He was sitting in bed reading the newspaper.  He was alert and oriented x 4 and neurologically grossly  Intact.  He had inspiratory wheeze bilaterally on lung exam with no rhonchi or rales.  Heart was rrr with no m/r/g although he is known to be in a-fib.  Abdomen was obese, soft, not tender, no mass or hsm.  No jvd.  No significant edema.  Disposition:discharge to home with his family on home oxygen  Discharge Orders    Future Appointments: Provider: Department: Dept Phone: Center:   09/23/2011 9:00 AM [facility]  [phone] [facility]   09/23/2011 9:30 AM Chuck Hint, MD [facility] [phone] [facility]     Future Orders Please Complete By Expires   For home use only DME oxygen      Comments:   2 Liters oxygen per nasal cannula  Continuous at home until we stop it. He went to 88% on oxygen with walking in the hospital.   Ambulatory referral to Home Health      Comments:   Please evaluate [name] for admission to [facility].  Disciplines requested: Nursing for med compliance  and Physical Therapy  Services to provide: Oxygen Via Nasal Cannula at 2 liters/minute  Physician to follow patient's care (the person listed here will be responsible for signing ongoing orders): PCP  Requested Start of Care Date: today [facility].  Special Instructions:     Diet - low sodium heart healthy      Increase activity slowly      Discharge instructions      Comments:   Call if problems,  If severe sob to ER.  OV with dr. Waynard Edwards Monday or Tuesday.     Discharge Medication List as of 05/07/2011  1:18 PM    START taking these medications   Details  amoxicillin-clavulanate (AUGMENTIN) 875-125 MG per tablet Take 1 tablet by mouth 2 (two) times daily., Starting 05/07/2011, Until Sun 05/17/11, Normal    Flora-Q (FLORA-Q) CAPS Take 1 capsule by mouth daily., Starting 05/07/2011, Until Discontinued, Normal    nystatin (MYCOSTATIN) 100000 UNIT/ML suspension Take 5 mLs (500,000 Units total) by mouth 4 (four) times daily., Starting 05/07/2011, Until Sun 05/17/11, Normal    predniSONE (  DELTASONE) 10 MG tablet Take 3 tablets (30 mg total) by mouth daily with breakfast., Starting 05/07/2011, Until Sun 05/17/11, Normal      CONTINUE these medications which have CHANGED   Details  warfarin (COUMADIN) 5 MG tablet Take 1 tablet (5 mg total) by mouth daily. Take one daily until instructed otherwise. Take at 6 p.m., Starting 05/07/2011, Until Discontinued, Normal      CONTINUE these medications  which have NOT CHANGED   Details  finasteride (PROSCAR) 5 MG tablet Take 5 mg by mouth daily.  , Until Discontinued, Historical Med    furosemide (LASIX) 20 MG tablet TAKE ONE TABLET BY MOUTH EVERY DAY, Normal    lisinopril (PRINIVIL,ZESTRIL) 20 MG tablet Take 20 mg by mouth daily.  , Until Discontinued, Historical Med    metoprolol (LOPRESSOR) 50 MG tablet Take 50 mg by mouth 2 (two) times daily. Take 1 tablet every morning and 1/2 tablet at night , Until Discontinued, Historical Med    potassium chloride (KLOR-CON) 20 MEQ packet Take 20 mEq by mouth daily. , Until Discontinued, Historical Med    simvastatin (ZOCOR) 40 MG tablet Take 40 mg by mouth at bedtime.  , Until Discontinued, Historical Med    terazosin (HYTRIN) 10 MG capsule Take 10 mg by mouth at bedtime.  , Until Discontinued, Historical Med      He will actually skip his dose of coumadin on Friday 05/08/11.  He will be seen in our office on Monday or Tuesday either 05/11/11 or 05/12/11. Follow-up Information    Follow up with Ezequiel Kayser, MD .         SignedRodrigo Ran A 05/07/2011, 8:22 PM

## 2011-05-07 NOTE — Progress Notes (Signed)
DC instructions reviewed with patient and family.  Patient and family denies further questions or concerns at this time.  NO changes noted since am assessment.  IV DC.  Patient to go home with home health and home O2-all set up.  Home meds extensively reviewed with patient and family, including date/time of when next dose is due.  Patient is ready to be DC to home.  RN took patient out to the car via wheelchair and loaded patient into the car with his home O2 tank.

## 2011-05-07 NOTE — Progress Notes (Signed)
Patient ID: Chad Buchanan, male   DOB: January 12, 1921, 77 y.o.   MRN: [id number] Subjective: He feels okay. Still wheezing.  He did walk in hall yesterday 3 times  Objective: Vital signs in last 24 hours: Temp:  [97.3 F (36.3 C)-97.7 F (36.5 C)] 97.7 F (36.5 C) (12/13 0645) Pulse Rate:  [64-88] 64  (12/13 0645) Resp:  [18-20] 18  (12/13 0645) BP: (118-149)/(66-90) 118/68 mmHg (12/13 0645) SpO2:  [91 %-98 %] 92 % (12/13 0645) Weight change:  Last BM Date: 05/06/11  Intake/Output from previous day: 12/12 0701 - 12/13 0700 In: 1090 [P.O.:840; IV Piggyback:250] Out: 200 [Urine:200] Intake/Output this shift:    General appearance: alert and cooperative Resp: clear to auscultation bilaterally and He still has inspiratory wheeze bilaterally,  he is moving air better than he came in though. no rhonchi. Cardio: regular rate and rhythm, S1, S2 normal, no murmur, click, rub or gallop GI: soft, non-tender; bowel sounds normal; no masses,  no organomegaly Extremities: extremities normal, atraumatic, no cyanosis or edema Neurologic: Grossly normal   Lab Results:  Basename 05/07/11 0515 05/06/11 0458  WBC 7.6 7.2  HGB 10.4* 10.6*  HCT 32.4* 32.6*  PLT 170 136*   BMET  Basename 05/07/11 0515 05/06/11 0458  NA 139 137  K 3.6 3.3*  CL 106 104  CO2 30 27  GLUCOSE 149* 121*  BUN 15 15  CREATININE 0.90 0.87  CALCIUM 9.2 8.9   CMET CMP     Component Value Date/Time   NA 139 05/07/2011 0515   K 3.6 05/07/2011 0515   CL 106 05/07/2011 0515   CO2 30 05/07/2011 0515   GLUCOSE 149* 05/07/2011 0515   BUN 15 05/07/2011 0515   CREATININE 0.90 05/07/2011 0515   CREATININE 0.97 03/13/2011 1123   CALCIUM 9.2 05/07/2011 0515   PROT 5.4* 05/07/2011 0515   ALBUMIN 2.3* 05/07/2011 0515   AST 26 05/07/2011 0515   ALT 35 05/07/2011 0515   ALKPHOS 89 05/07/2011 0515   BILITOT 0.2* 05/07/2011 0515   GFRNONAA 74* 05/07/2011 0515   GFRAA 86* 05/07/2011 0515   Lab Results  Component  Value Date   INR 3.63* 05/07/2011   INR 2.79* 05/06/2011   INR 2.46* 05/05/2011     Studies/Results: No results found.  Medications: I have reviewed the patient's current medications.  Assessment/Plan:  Principal Problem:  *Fever and chills-improving. He still has bronchospasm but he is breathing comfortably. Active Problems:  Hypoxia-he will go home with oxygen. I still want him tested but i think he needs it.  Atrial fibrillation-follow.  Coronary atherosclerosis-follow  Impaired fasting glucose-follow  Unspecified hearing loss-follow  Unspecified essential hypertension-follow  Right hip pain-better  AAA (abdominal aortic aneurysm) without rupture-follow  Other and unspecified hyperlipidemia-follow  Hypokalemia-better    LOS: 5 days   Marcelene Weidemann A, MD 05/07/2011, 7:39 AM

## 2011-05-07 NOTE — Progress Notes (Signed)
Home health is requesting an updated O2 sat.  RN went in to check pt's O2 sat and patient reported that he did not want to go home on home O2.  RN took patient off of the 2L nasal cannula and patient began deep breathing.  RN asked patient to stop breathing so heavily, as he does not normally breathe this heavy.  Patient was 100% on 2L [place], 93-99% on RA.  RN left the O2 off of the patient and asked him to keep it off for 30 mins or so until RN could re-check it again to form a baseline.  Upon re-entering into the room, patient had re-placed his O2, preventing the RN from getting a reading.

## 2011-05-07 NOTE — Progress Notes (Signed)
ANTICOAGULATION CONSULT NOTE - Follow up Consult  Pharmacy Consult for: Warfarin Indication: Atrial fibrillation  Allergies  Allergen Reactions  . Oxycontin   . Ultram (Tramadol Hcl)     oversedation    Patient Measurements: Weight = 110 kg (on 03/25/11) Height = 73 inches  Vital Signs: Temp: 97.7 F (36.5 C) (12/13 0645) Temp src: Oral (12/13 0645) BP: 118/68 mmHg (12/13 0645) Pulse Rate: 64  (12/13 0645)  Labs:  Basename 05/07/11 0515 05/06/11 0458 05/05/11 0530  HGB 10.4* 10.6* --  HCT 32.4* 32.6* 34.1*  PLT 170 136* 118*  APTT -- -- --  LABPROT 36.7* 29.9* 27.1*  INR 3.63* 2.79* 2.46*  HEPARINUNFRC -- -- --  CREATININE 0.90 0.87 0.85  CKTOTAL -- -- --  CKMB -- -- --  TROPONINI -- -- --   Estimated Creatinine Clearance: 72.8 ml/min (by C-G formula based on Cr of 0.9).  Medical History: Past Medical History  Diagnosis Date  . Hypertension   . Hyperlipidemia   . AAA (abdominal aortic aneurysm)   . Atrial fibrillation   . BPH (benign prostatic hypertrophy)   . Encounter for long-term (current) use of anticoagulants   . Cancer     skin cancer ( Basal cell)    Medications:  Scheduled:     . cefTRIAXone (ROCEPHIN)  IV  1 g Intravenous Q24H  . finasteride  5 mg Oral Daily  . Flora-Q  1 capsule Oral Daily  . ipratropium  0.5 mg Nebulization Q6H  . levalbuterol  0.63 mg Nebulization Q6H  . lisinopril  20 mg Oral Daily  . metoprolol  50 mg Oral BID  . nystatin  5 mL Oral QID  . pantoprazole  40 mg Oral Q1200  . potassium chloride  20 mEq Oral Daily  . predniSONE  30 mg Oral Q breakfast  . simvastatin  40 mg Oral QHS  . terazosin  10 mg Oral QHS  . warfarin  5 mg Oral ONCE-1800   Patient's usual warfarin dosage is reportedly 7.5mg  Tues, Thurs, Sat, Sun; 5mg  Mon, Wed, Fri.    Assessment:  89 YOM on Coumadin PTA for atrial fibrillation  INR supratherapeutic today on home dose  No bleeding noted, CBC stable.  Goal of Therapy:  INR 2-3   Plan:    No coumadin tonight  Pharmacy will f/u in AM   Theda Sers Pager: [phone] 05/07/2011 8:18 AM

## 2011-09-22 ENCOUNTER — Encounter: Payer: Self-pay | Admitting: Neurosurgery

## 2011-09-23 ENCOUNTER — Encounter: Payer: Self-pay | Admitting: Neurosurgery

## 2011-09-23 ENCOUNTER — Encounter (INDEPENDENT_AMBULATORY_CARE_PROVIDER_SITE_OTHER): Payer: Medicare Other | Admitting: *Deleted

## 2011-09-23 ENCOUNTER — Ambulatory Visit (INDEPENDENT_AMBULATORY_CARE_PROVIDER_SITE_OTHER): Payer: Medicare Other | Admitting: Neurosurgery

## 2011-09-23 VITALS — BP 170/90 | HR 60 | Resp 16 | Ht 73.0 in | Wt 244.2 lb

## 2011-09-23 DIAGNOSIS — I714 Abdominal aortic aneurysm, without rupture: Secondary | ICD-10-CM

## 2011-09-23 DIAGNOSIS — Z48812 Encounter for surgical aftercare following surgery on the circulatory system: Secondary | ICD-10-CM

## 2011-09-23 NOTE — Progress Notes (Signed)
[facility] HISTORY AND PHYSICAL   CC: Six-month vascular study for followup AAA endograft placed May 2011 Referring Physician: Edilia Bo  History of Present Illness: 78 year old male patient of Dr. Adele Dan that is 2 years status post a Gore endograft for AAA. The patient reports no abdominal pain or back pain, AAA sac size today is 5.13 per ultrasound. Patient states he did hurt his back when he fell off a kitchen stool and he is walking with a limp but not being formally treated. He reports no other medical issues at this time, he has been treated for congestive heart failure in the past.  Past Medical History  Diagnosis Date  . Hypertension   . Hyperlipidemia   . Atrial fibrillation   . BPH (benign prostatic hypertrophy)   . Encounter for long-term (current) use of anticoagulants   . Cancer     skin cancer ( Basal cell)  . Myocardial infarction 1993  . AAA (abdominal aortic aneurysm) 09/25/2009    ROS: [x]  Positive   [ ]  Denies    General: [ ]  Weight loss, [ ]  Fever, [ ]  chills Neurologic: [ ]  Dizziness, [ ]  Blackouts, [ ]  Seizure [ ]  Stroke, [ ]  "Mini stroke", [ ]  Slurred speech, [ ]  Temporary blindness; [x ] weakness in arms or legs, [ ]  Hoarseness Cardiac: [ ]  Chest pain/pressure, [ ]  Shortness of breath at rest [ ]  Shortness of breath with exertion, [ ]  Atrial fibrillation or irregular heartbeat Vascular: [x ] Pain in legs with walking, [ ]  Pain in legs at rest, [ ]  Pain in legs at night,  [ ]  Non-healing ulcer, [ ]  Blood clot in vein/DVT,   Pulmonary: [ ]  Home oxygen, [ ]  Productive cough, [ ]  Coughing up blood, [ ]  Asthma,  [ ]  Wheezing Musculoskeletal:  [ ]  Arthritis, [x ] Low back pain, [ x] Joint pain Hematologic: [ ]  Easy Bruising, [ ]  Anemia; [ ]  Hepatitis Gastrointestinal: [ ]  Blood in stool, [ ]  Gastroesophageal Reflux/heartburn, [ ]  Trouble swallowing Urinary: [ ]  chronic Kidney disease, [ ]  on HD - [ ]  MWF or [ ]  TTHS, [ ]  Burning with  urination, [ ]  Difficulty urinating Skin: [ ]  Rashes, [ ]  Wounds Psychological: [ ]  Anxiety, [ ]  Depression   Social History History  Substance Use Topics  . Smoking status: Former Smoker    Quit date: 07/30/1991  . Smokeless tobacco: Not on file  . Alcohol Use: Yes    Family History Family History  Problem Relation Age of Onset  . Stroke Father     Allergies  Allergen Reactions  . Oxycodone Hcl Er   . Ultram (Tramadol Hcl)     oversedation    Current Outpatient Prescriptions  Medication Sig Dispense Refill  . finasteride (PROSCAR) 5 MG tablet Take 5 mg by mouth daily.        . furosemide (LASIX) 20 MG tablet TAKE ONE TABLET BY MOUTH EVERY DAY  90 tablet  3  . lisinopril (PRINIVIL,ZESTRIL) 20 MG tablet Take 20 mg by mouth daily.        . metoprolol (LOPRESSOR) 50 MG tablet Take 50 mg by mouth 2 (two) times daily. Take 1 tablet every morning and 1/2 tablet at night       . Multiple Vitamin (MULTIVITAMIN) tablet Take 1 tablet by mouth daily.      . potassium chloride (KLOR-CON) 20 MEQ packet Take 20 mEq by mouth daily.       Marland Kitchen  simvastatin (ZOCOR) 40 MG tablet Take 40 mg by mouth at bedtime.        Marland Kitchen terazosin (HYTRIN) 10 MG capsule Take 10 mg by mouth at bedtime.        Marland Kitchen warfarin (COUMADIN) 5 MG tablet Take 1 tablet (5 mg total) by mouth daily. Take one daily until instructed otherwise. Take at 6 p.m.  60 tablet  11    Physical Examination  Filed Vitals:   09/23/11 0933  BP: 170/90  Pulse: 60  Resp: 16    Body mass index is 32.22 kg/(m^2).  General:  WDWN in NAD Gait: Normal HEENT: WNL Eyes: Pupils equal Pulmonary: normal non-labored breathing , without Rales, rhonchi,  wheezing Cardiac: RRR, without  Murmurs, rubs or gallops; No carotid bruits Abdomen: soft, NT, no masses Skin: no rashes, ulcers noted Vascular Exam/Pulses: PT and DP pulses are not palpable due to the edema in the lower extremities, no abdominal mass palpable due to girth.  Extremities  without ischemic changes, no Gangrene , no cellulitis; no open wounds;  Musculoskeletal: no muscle wasting or atrophy  Neurologic: A&O X 3; Appropriate Affect ; SENSATION: normal; MOTOR FUNCTION:  moving all extremities equally. Speech is fluent/normal  Non-Invasive Vascular Imaging: Abdominal ultrasound today shows the AAA sac size to be 5.13 previously in April 2012 it was 5.3  ASSESSMENT/PLAN: Dr. Edilia Bo reviewed the lab today and reviewed the last CT done in October 2012 , is no endoleak and AAA sac size has diminished. Per Dr. Adele Dan recommendation, the patient will followup in one year with a repeat abdominal ultrasound and followup in my clinic. His questions were encouraged and answered.  Lauree Chandler ANP  Clinic M.D.: Edilia Bo

## 2011-09-23 NOTE — Progress Notes (Signed)
Addended by: Sharee Pimple on: 09/23/2011 02:48 PM   Modules accepted: Orders

## 2011-09-30 NOTE — Procedures (Unsigned)
VASCULAR LAB EXAM  INDICATION:  Follow up AAA endograft placed 09/25/2009.  HISTORY: Diabetes:  No. Cardiac:  No. Hypertension:  Yes.  EXAM: 1. AAA sac size 5.13 cm AP. 2. Previous sac size 09/10/2010, 5.3 cm AP.  IMPRESSION: 1. The aorta and endograft appear patent, where visualized. 2. No significant change in the size of the aneurysmal sac surrounding     the endograft. 3. No evidence of endoleak was detected. 4. Typical study due to bowel gas and patient body habitus.  ___________________________________________ Di Kindle. Edilia Bo, M.D.  EM/MEDQ  D:  09/23/2011  T:  09/23/2011  Job:  [id number]

## 2011-11-19 ENCOUNTER — Encounter (HOSPITAL_COMMUNITY): Payer: Self-pay | Admitting: Emergency Medicine

## 2011-11-19 ENCOUNTER — Emergency Department (HOSPITAL_COMMUNITY)
Admission: EM | Admit: 2011-11-19 | Discharge: 2011-11-19 | Disposition: A | Discharge status: Home / Self Care | Payer: Medicare Other | Attending: Emergency Medicine | Admitting: Emergency Medicine

## 2011-11-19 DIAGNOSIS — I252 Old myocardial infarction: Secondary | ICD-10-CM | POA: Insufficient documentation

## 2011-11-19 DIAGNOSIS — R109 Unspecified abdominal pain: Secondary | ICD-10-CM | POA: Insufficient documentation

## 2011-11-19 DIAGNOSIS — E785 Hyperlipidemia, unspecified: Secondary | ICD-10-CM | POA: Insufficient documentation

## 2011-11-19 DIAGNOSIS — I4891 Unspecified atrial fibrillation: Secondary | ICD-10-CM | POA: Insufficient documentation

## 2011-11-19 DIAGNOSIS — N400 Enlarged prostate without lower urinary tract symptoms: Secondary | ICD-10-CM | POA: Insufficient documentation

## 2011-11-19 DIAGNOSIS — Z7901 Long term (current) use of anticoagulants: Secondary | ICD-10-CM | POA: Insufficient documentation

## 2011-11-19 DIAGNOSIS — Z87891 Personal history of nicotine dependence: Secondary | ICD-10-CM | POA: Insufficient documentation

## 2011-11-19 DIAGNOSIS — Z79899 Other long term (current) drug therapy: Secondary | ICD-10-CM | POA: Insufficient documentation

## 2011-11-19 LAB — CBC WITH DIFFERENTIAL/PLATELET
Basophils Absolute: 0 10*3/uL (ref 0.0–0.1)
Basophils Relative: 0 % (ref 0–1)
Eosinophils Absolute: 0.1 10*3/uL (ref 0.0–0.7)
Eosinophils Relative: 3 % (ref 0–5)
HCT: 39.4 % (ref 39.0–52.0)
Hemoglobin: 12.4 g/dL — ABNORMAL LOW (ref 13.0–17.0)
Lymphocytes Relative: 10 % — ABNORMAL LOW (ref 12–46)
Lymphs Abs: 0.5 10*3/uL — ABNORMAL LOW (ref 0.7–4.0)
MCH: 26.7 pg (ref 26.0–34.0)
MCHC: 31.5 g/dL (ref 30.0–36.0)
MCV: 84.7 fL (ref 78.0–100.0)
Monocytes Absolute: 0.5 10*3/uL (ref 0.1–1.0)
Monocytes Relative: 9 % (ref 3–12)
Neutro Abs: 4.2 10*3/uL (ref 1.7–7.7)
Neutrophils Relative %: 79 % — ABNORMAL HIGH (ref 43–77)
Platelets: 120 10*3/uL — ABNORMAL LOW (ref 150–400)
RBC: 4.65 MIL/uL (ref 4.22–5.81)
RDW: 15.5 % (ref 11.5–15.5)
WBC: 5.3 10*3/uL (ref 4.0–10.5)

## 2011-11-19 LAB — COMPREHENSIVE METABOLIC PANEL
ALT: 28 U/L (ref 0–53)
AST: 54 U/L — ABNORMAL HIGH (ref 0–37)
Albumin: 3.7 g/dL (ref 3.5–5.2)
Alkaline Phosphatase: 95 U/L (ref 39–117)
BUN: 16 mg/dL (ref 6–23)
CO2: 27 mEq/L (ref 19–32)
Calcium: 9.5 mg/dL (ref 8.4–10.5)
Chloride: 104 mEq/L (ref 96–112)
Creatinine, Ser: 0.88 mg/dL (ref 0.50–1.35)
GFR calc Af Amer: 86 mL/min — ABNORMAL LOW (ref >90)
GFR calc non Af Amer: 74 mL/min — ABNORMAL LOW (ref >90)
Glucose, Bld: 131 mg/dL — ABNORMAL HIGH (ref 70–99)
Potassium: 4 mEq/L (ref 3.5–5.1)
Sodium: 138 mEq/L (ref 135–145)
Total Bilirubin: 0.6 mg/dL (ref 0.3–1.2)
Total Protein: 7 g/dL (ref 6.0–8.3)

## 2011-11-19 LAB — URINALYSIS, ROUTINE W REFLEX MICROSCOPIC
Glucose, UA: NEGATIVE mg/dL
Hgb urine dipstick: NEGATIVE
Leukocytes, UA: NEGATIVE
Protein, ur: 100 mg/dL — AB
Specific Gravity, Urine: 1.024 (ref 1.005–1.030)
Urobilinogen, UA: 1 mg/dL (ref 0.0–1.0)

## 2011-11-19 LAB — POCT I-STAT TROPONIN I: Troponin i, poc: 0 ng/mL (ref 0.00–0.08)

## 2011-11-19 NOTE — ED Provider Notes (Signed)
History     CSN: [id number]  Arrival date & time 11/19/11  1321   First MD Initiated Contact with Patient 11/19/11 1447      Chief Complaint  Patient presents with  . Abdominal Pain    (Consider location/radiation/quality/duration/timing/severity/associated sxs/prior treatment) The history is provided by the patient and the spouse.   patient presents to emergency department complaining of acute onset lower abdominal pain that began at 1:00 this afternoon. Patient states he woke this morning and ate breakfast like normal and had lunch like normal and felt well. Patient states that at 1 clock he had acute onset lower abdominal pain that felt like  "large gas bubble that I needed to pass." Patient states that he became concerned when he could seem to belch to relieve the discomfort and his wife drove him to the ER however upon arrival to the emergency department patient states the pain had completely resolved. He denies belching or passing gas but states that the pain resolved on its own. He denies any associated nausea, vomiting, diarrhea, fevers, chills, chest pain, or shortness of breath. He denies being faint or dizzy feeling. Patient is completely asymptomatic currently. Patient has history of abdominal aortic aneurysm that required repair by vascular surgery approximately 2 years ago that he states has been monitored every 6 months since with no changes. Patient also has history of paroxysmal atrial fib that is monitored by his primary care physician and last had lab draws at his primary care physician's office yesterday and he states they were "normal." Symptoms are acute onset, and resolved. Denies aggravating or alleviating factors. Took nothing for symptoms prior to arrival.  Past Medical History  Diagnosis Date  . Hypertension   . Hyperlipidemia   . Atrial fibrillation   . BPH (benign prostatic hypertrophy)   . Encounter for long-term (current) use of anticoagulants   . Cancer    skin cancer ( Basal cell)  . Myocardial infarction 1993  . AAA (abdominal aortic aneurysm) 09/25/2009    Past Surgical History  Procedure Date  . Abdominal aortic aneurysm repair   . Eye surgery     Family History  Problem Relation Age of Onset  . Stroke Father     History  Substance Use Topics  . Smoking status: Former Smoker    Quit date: 07/30/1991  . Smokeless tobacco: Not on file  . Alcohol Use: Yes      Review of Systems  All other systems reviewed and are negative.    Allergies  Oxycodone hcl er and Ultram  Home Medications   Current Outpatient Rx  Name Route Sig Dispense Refill  . FINASTERIDE 5 MG PO TABS Oral Take 5 mg by mouth daily.      Marland Kitchen LISINOPRIL 20 MG PO TABS Oral Take 20 mg by mouth daily.      Marland Kitchen METOPROLOL TARTRATE 50 MG PO TABS Oral Take 25-50 mg by mouth 2 (two) times daily. Take 1 table ( 50mg  ) every morning and 1/2 tablet at night ( 25mg )    . SIMVASTATIN 40 MG PO TABS Oral Take 40 mg by mouth at bedtime.      . TERAZOSIN HCL 10 MG PO CAPS Oral Take 10 mg by mouth at bedtime.      . WARFARIN SODIUM 5 MG PO TABS Oral Take 5 mg by mouth daily. Pt takes 5mg  on Monday Friday. Pt takes 7.5mg  on Tuesday,wednesday,thursday,satuday,sunday.    . FUROSEMIDE 20 MG PO TABS  TAKE ONE  TABLET BY MOUTH EVERY DAY 90 tablet 3    BP 154/79  Pulse 52  Temp 97.6 F (36.4 C) (Oral)  Resp 16  Ht 6' (1.829 m)  Wt 237 lb (107.502 kg)  BMI 32.14 kg/m2  SpO2 94%  Physical Exam  Nursing note and vitals reviewed. Constitutional: He is oriented to person, place, and time. He appears well-developed and well-nourished. No distress.  HENT:  Head: Normocephalic and atraumatic.  Eyes: Conjunctivae are normal.  Neck: Normal range of motion. Neck supple.  Cardiovascular: Normal rate, normal heart sounds and intact distal pulses.  Exam reveals no gallop and no friction rub.   No murmur heard.      Irregularly irregular  Pulmonary/Chest: Effort normal and breath  sounds normal. No respiratory distress. He has no wheezes. He has no rales. He exhibits no tenderness.  Abdominal: Soft. Bowel sounds are normal. He exhibits no distension and no mass. There is no tenderness. There is no rebound and no guarding.  Musculoskeletal: Normal range of motion. He exhibits no edema and no tenderness.  Neurological: He is alert and oriented to person, place, and time.  Skin: Skin is warm and dry. No rash noted. He is not diaphoretic. No erythema.  Psychiatric: He has a normal mood and affect.    ED Course  Procedures (including critical care time)  Labs Reviewed  CBC WITH DIFFERENTIAL - Abnormal; Notable for the following:    Hemoglobin 12.4 (*)     Platelets 120 (*)     Neutrophils Relative 79 (*)     Lymphocytes Relative 10 (*)     Lymphs Abs 0.5 (*)     All other components within normal limits  COMPREHENSIVE METABOLIC PANEL - Abnormal; Notable for the following:    Glucose, Bld 131 (*)     AST 54 (*)     GFR calc non Af Amer 74 (*)     GFR calc Af Amer 86 (*)     All other components within normal limits  PROTIME-INR - Abnormal; Notable for the following:    Prothrombin Time 26.4 (*)     INR 2.38 (*)     All other components within normal limits  URINALYSIS, ROUTINE W REFLEX MICROSCOPIC - Abnormal; Notable for the following:    Protein, ur 100 (*)     All other components within normal limits  POCT I-STAT TROPONIN I  URINE MICROSCOPIC-ADD ON   No results found.   1. Abdominal pain   2. Atrial fibrillation    Patient evaluated by Dr. Juleen China at bedside who agrees that there is no need for imaging at this time given a completely non tender abdomen that is not acute throughout ER stay with patient stating complete resolution of pain that lasted only 30 minutes and no acute findings on labs. I spoke at length with patient about any changing or worsening of symptoms that should prompt return to ER and patient voices understanding and is agreeable to  plan.   Patient states he is hungry and he wants to leave and go eat at [employer].    Date: 11/19/2011  Rate: 52  Rhythm: atrial fibrillation  QRS Axis: normal  Intervals: normal  ST/T Wave abnormalities: normal  Conduction Disutrbances:none  Narrative Interpretation:   Old EKG Reviewed: non provocative and no signficant changes compared to May 12, 2011   MDM          [place], [place] 11/19/11 [phone]

## 2011-11-19 NOTE — ED Notes (Signed)
Per pt, had sharp abdominal pain about a hour ago, lasted about 30 minutes-pain resolved

## 2011-11-19 NOTE — Discharge Instructions (Signed)
Return to ER at any time for any changing or worsening of symptoms.

## 2011-11-19 NOTE — ED Provider Notes (Signed)
Medical screening examination/treatment/procedure(s) were conducted as a shared visit with non-physician practitioner(s) and myself.  I personally evaluated the patient during the encounter.  89yM with lower abdominal pain. Completely resolved. Benign abdominal exam. Pt would like to go eat at [employer] now. Very low suspicion for emergent etiology. W/u reassuring. Do not feel imaging indicated at this time. Return precautions discussed. Outpt fu otherwise.  Raeford Razor, MD 11/19/11 1630

## 2011-11-26 NOTE — ED Provider Notes (Signed)
Medical screening examination/treatment/procedure(s) were performed by non-physician practitioner and as supervising physician I was immediately available for consultation/collaboration.  Raeford Razor, MD 11/26/11 [phone]

## 2012-01-12 IMAGING — CR DG HIP COMPLETE 2+V*R*
3 series · 3 of 3 positions shown · non-contrast
Comparison: None.

CLINICAL DATA: 3 months ago with persistent pain.

RIGHT HIP - COMPLETE 2+ VIEW

[t pelvis ap]
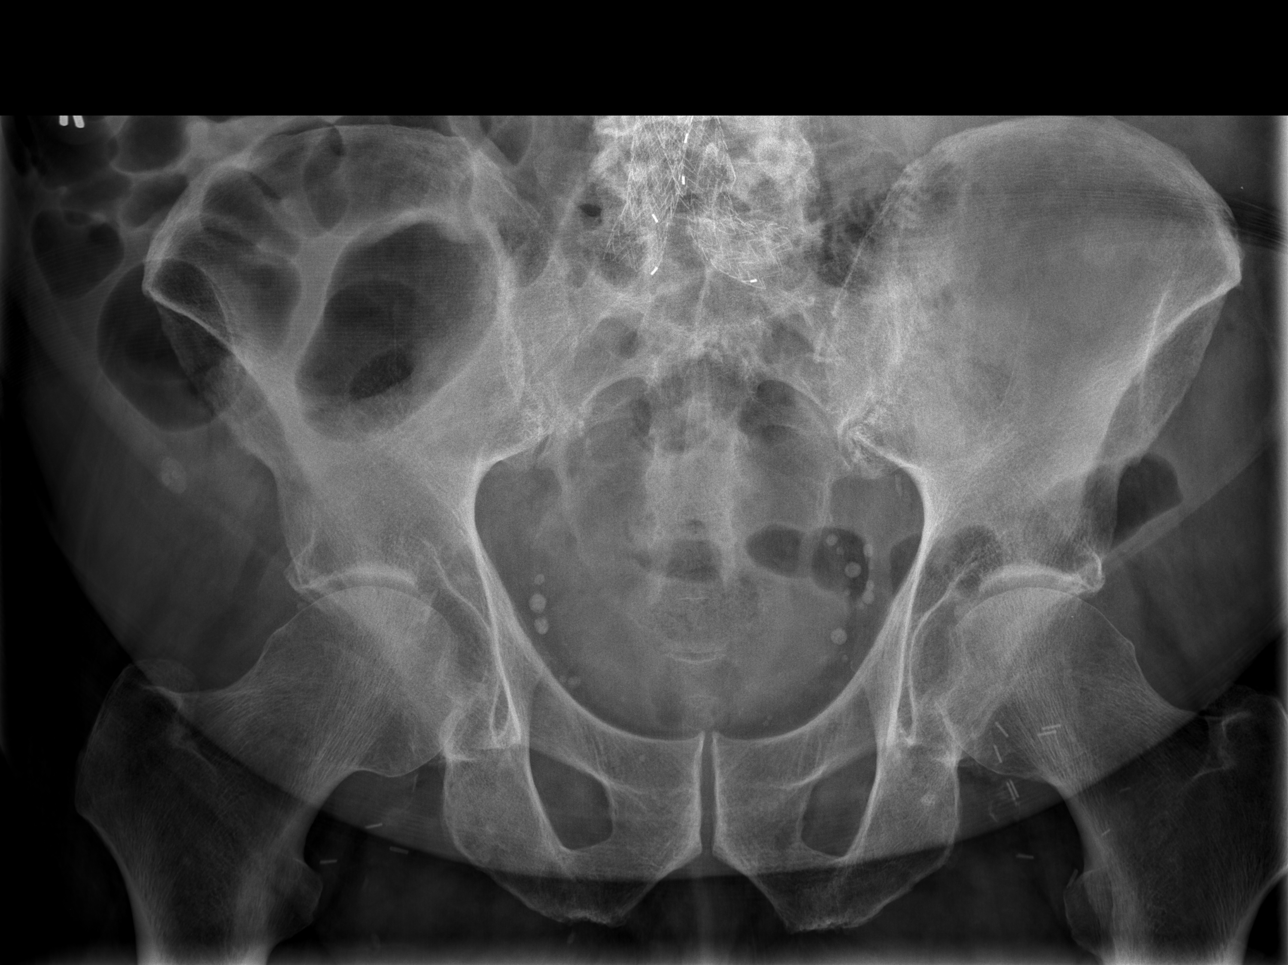

[t hip ap right]
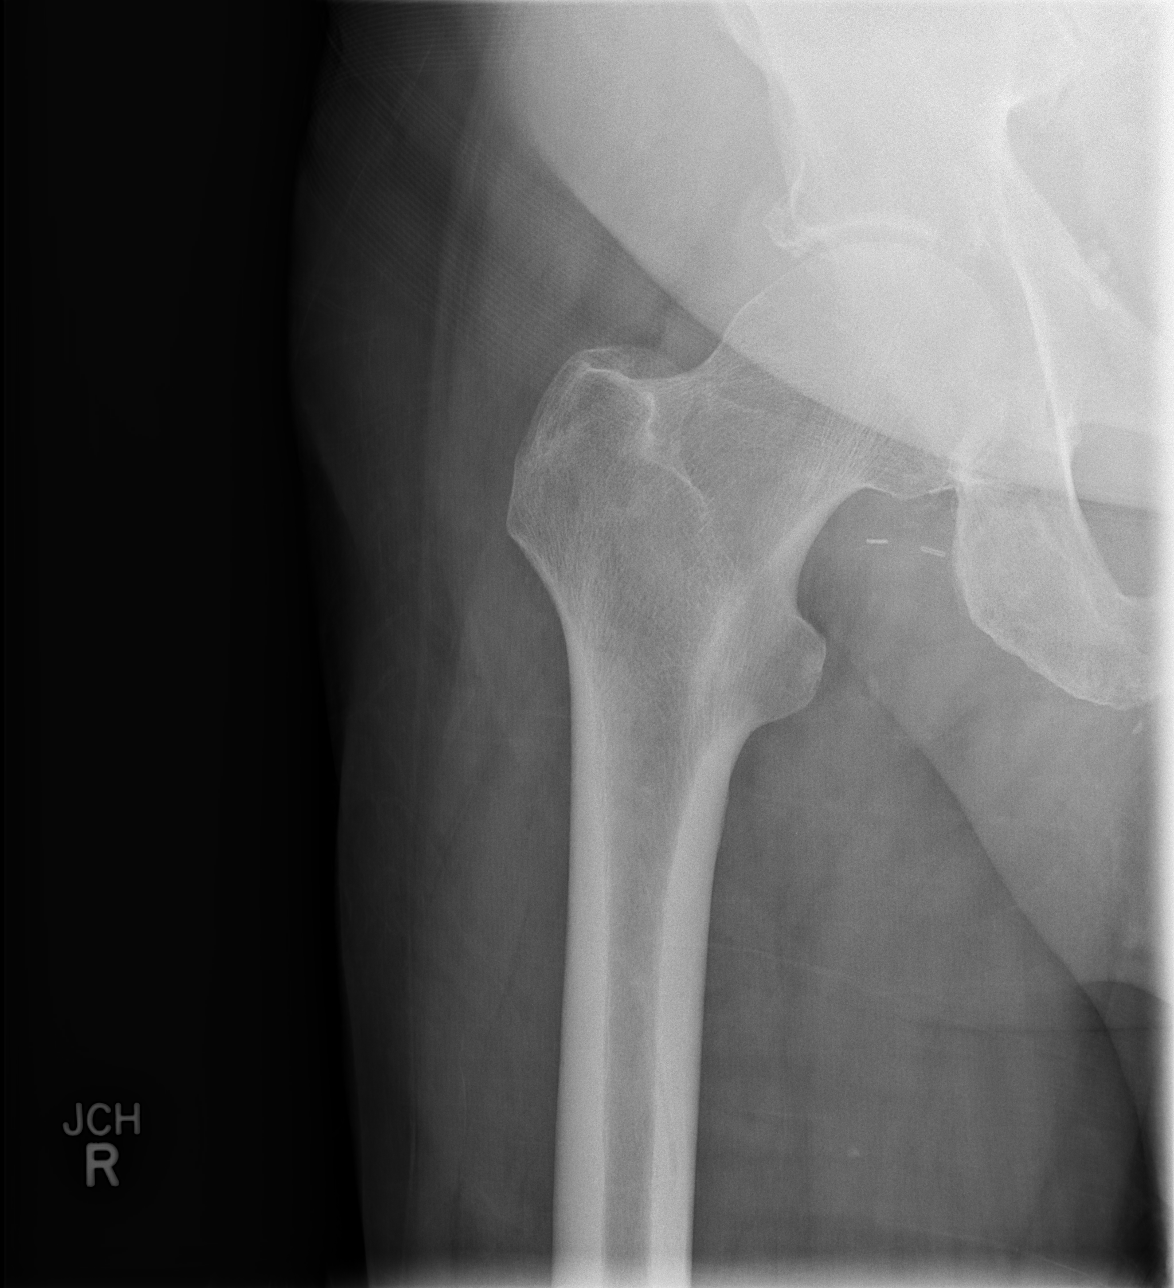

[t hip frog leg right]
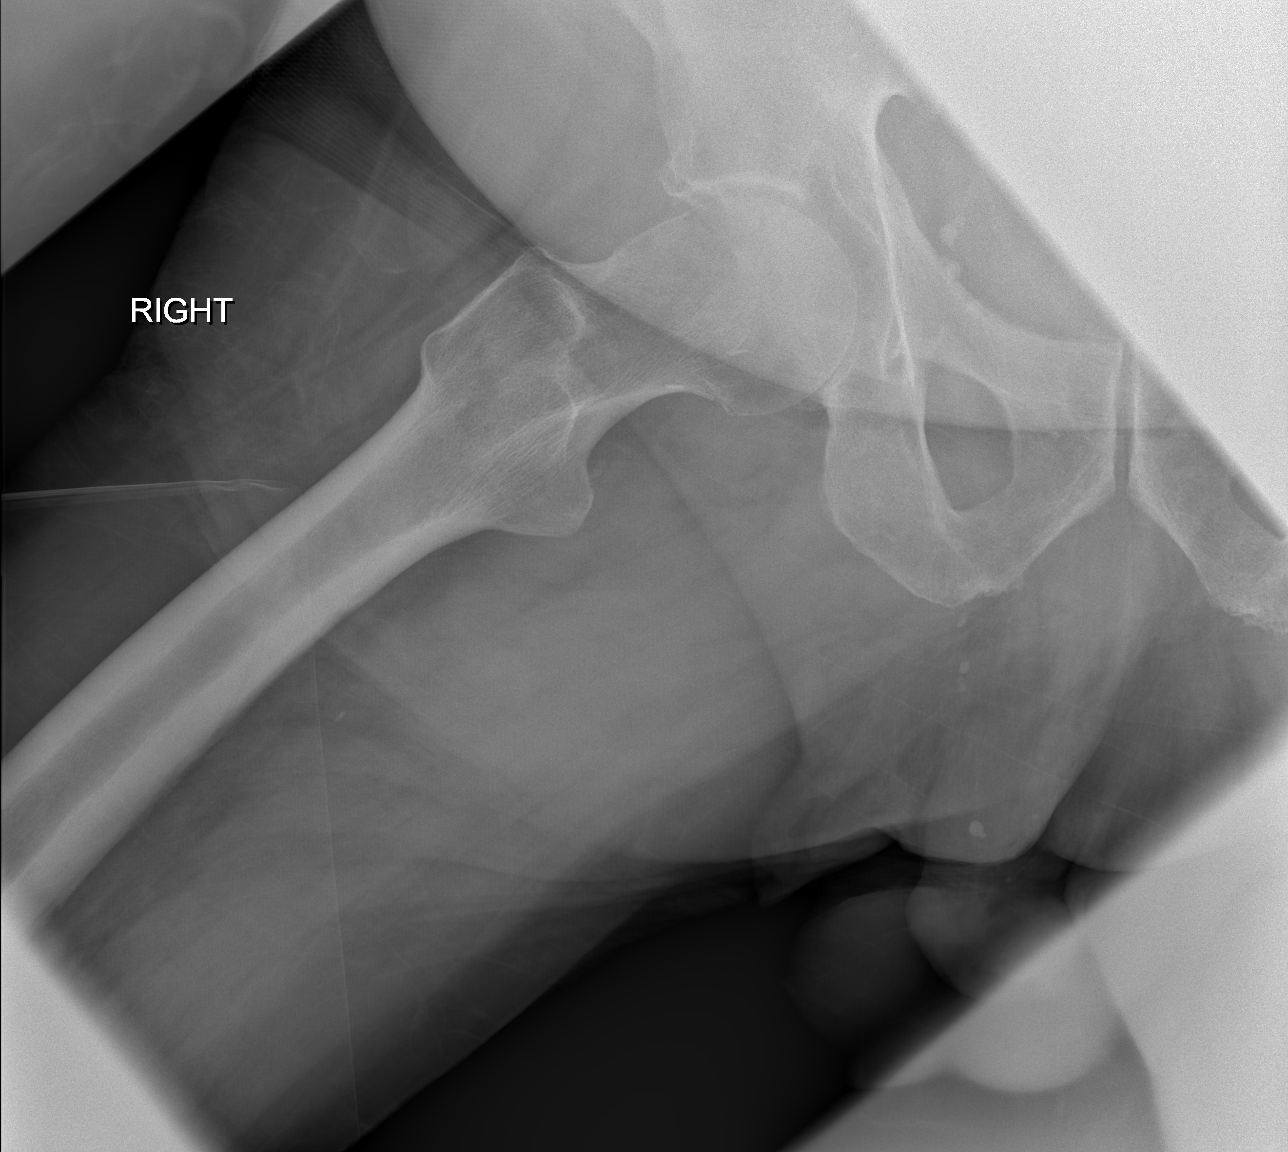

[3 of 3 positions shown; findings below may reference images not displayed]

FINDINGS: A single AP view of the pelvis demonstrates an aortic
stent graft.  Degenerative changes are noted at the SI joints
bilaterally with partial fusion.  Mild degenerative changes are
present in the hips.

The right hip is located.  No acute bone or soft tissue
abnormalities present.  Atherosclerotic calcifications are present
in the femoral artery.  Multiple phleboliths are seen in the
anatomic pelvis.
IMPRESSION: 1.  No acute abnormality.
2.  Mild degenerative changes of the hips.
3.  Aortic stent graft.
4.  Pelvic phleboliths.

## 2012-01-14 IMAGING — CR DG CHEST 2V
2 series · 2 of 2 positions shown · non-contrast
Comparison: 05/02/2011 and 09/25/2009 and 09/23/2009

CLINICAL DATA: Pneumonia.  Hypertension.  Atrial fibrillation.

CHEST - 2 VIEW

[w chest lat]
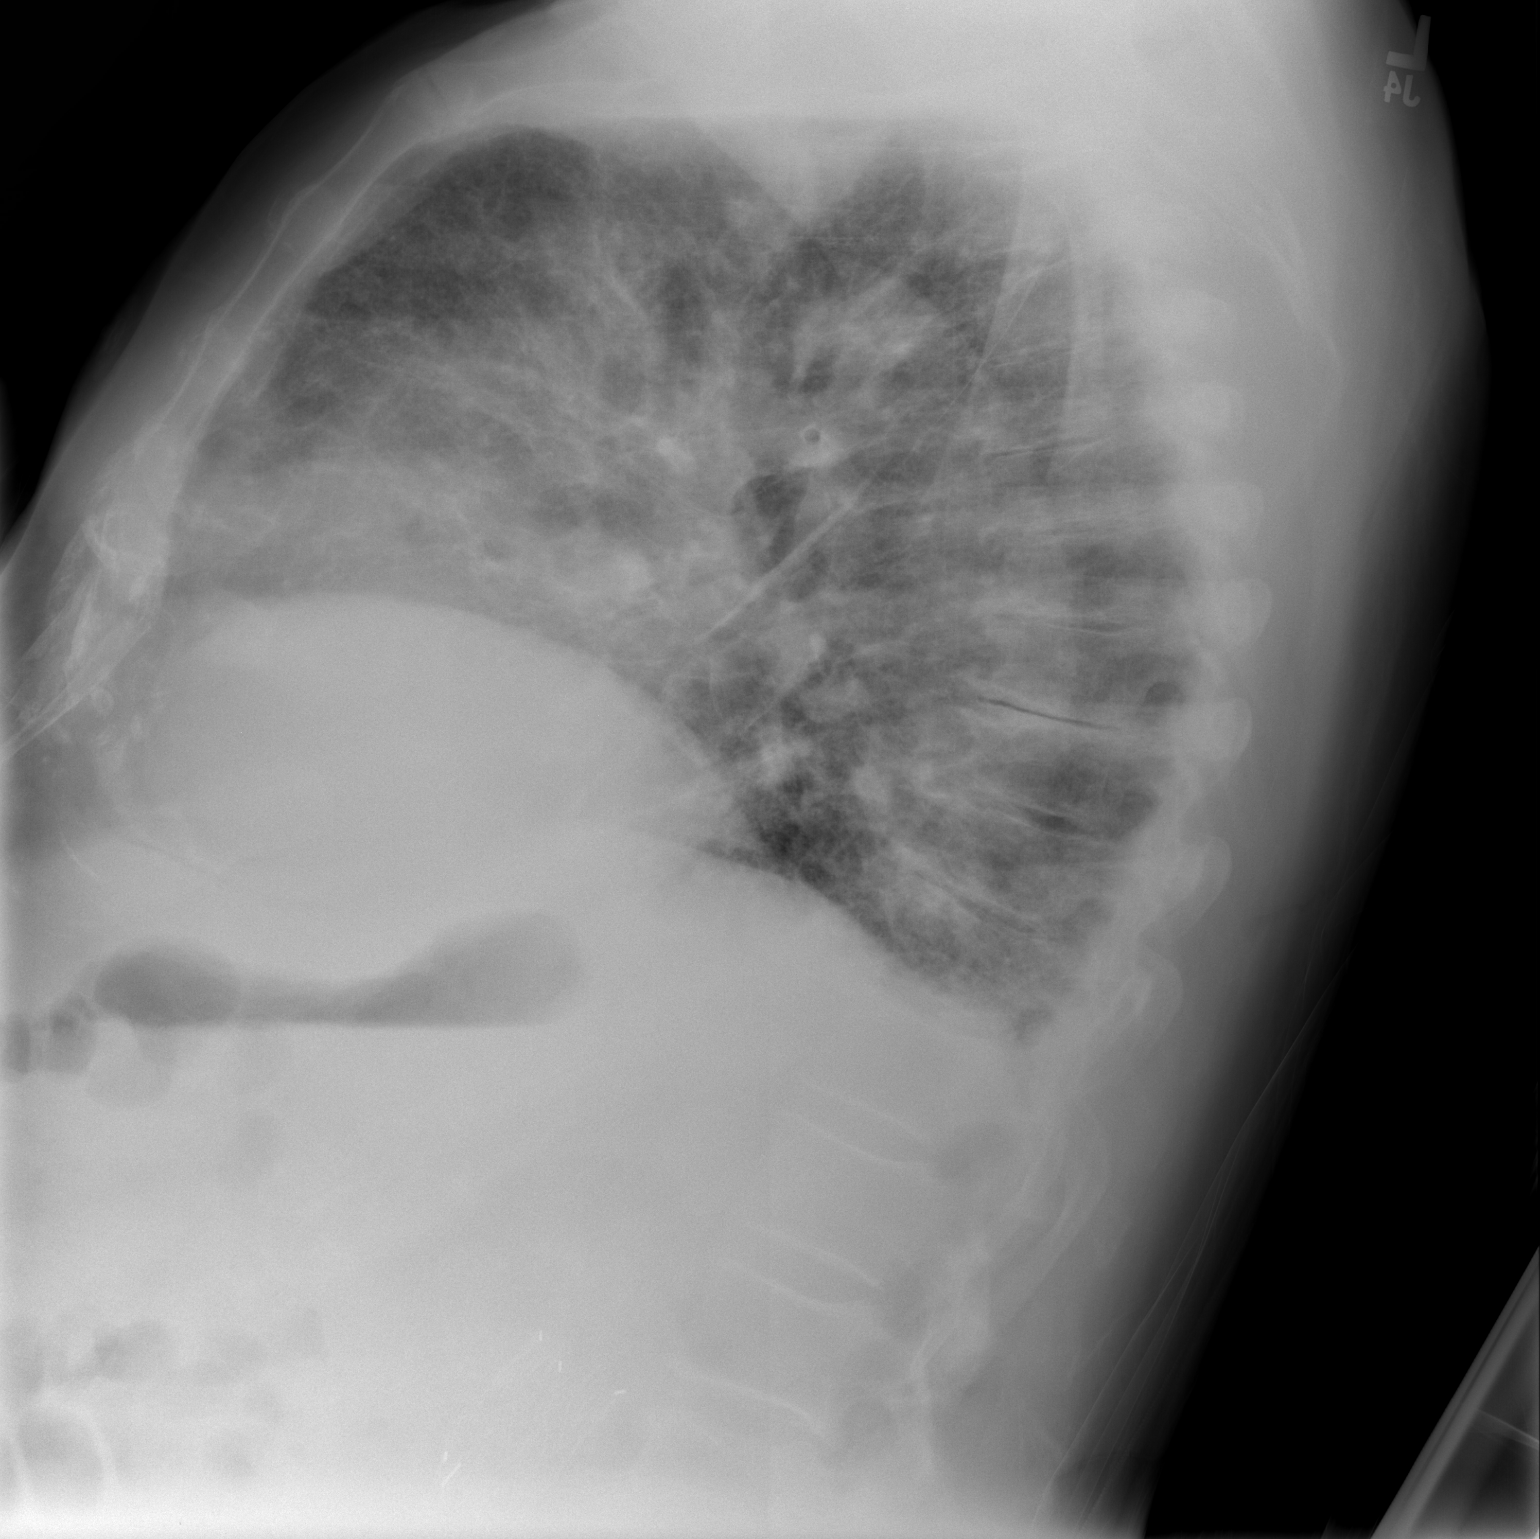

[view not recorded]
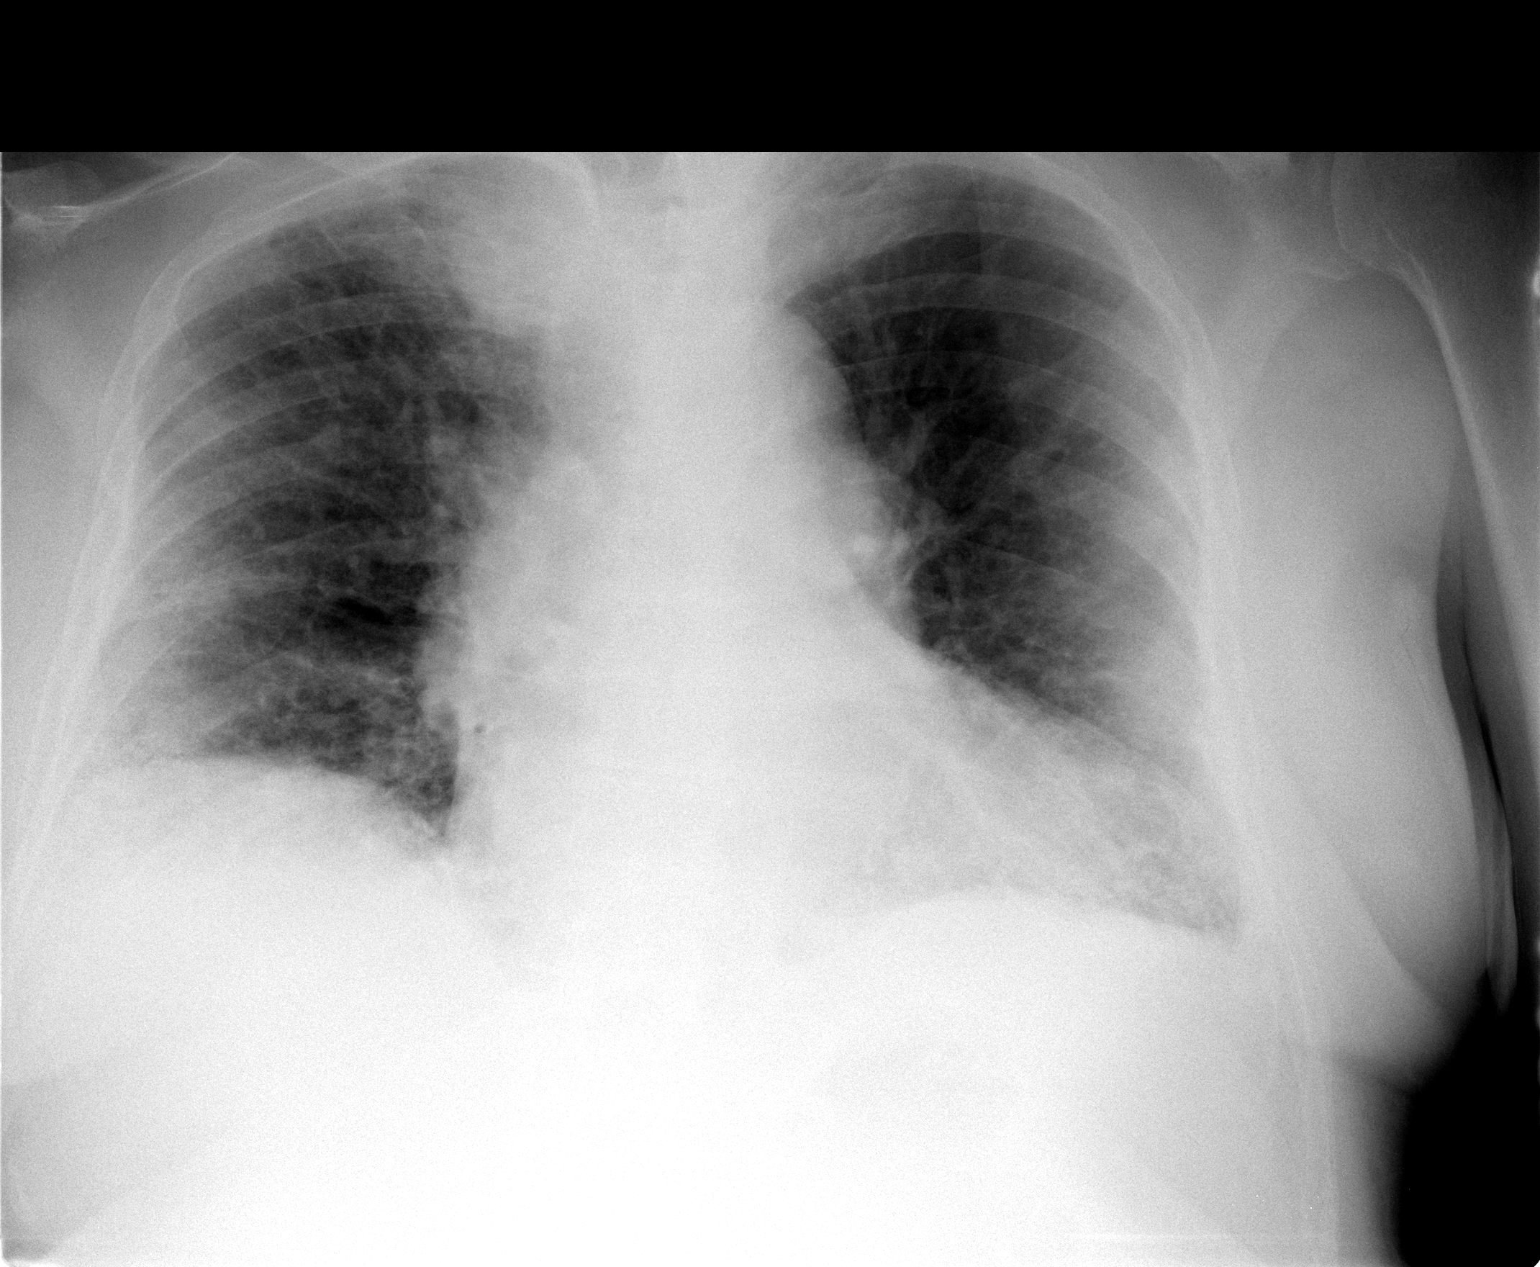

[2 of 2 positions shown; findings below may reference images not displayed]

FINDINGS: The interstitial markings have increased since the prior
study with increased peribronchial thickening.  Mild cardiomegaly
is unchanged.  Vascularity is slightly more prominent.
IMPRESSION: Findings consistent with progressive pneumonitis.

## 2012-02-29 ENCOUNTER — Encounter: Payer: Self-pay | Admitting: *Deleted

## 2012-03-02 ENCOUNTER — Other Ambulatory Visit: Payer: Self-pay | Admitting: Orthopaedic Surgery

## 2012-03-02 DIAGNOSIS — M545 Low back pain: Secondary | ICD-10-CM

## 2012-03-03 ENCOUNTER — Ambulatory Visit
Admission: RE | Admit: 2012-03-03 | Discharge: 2012-03-03 | Disposition: A | Discharge status: Home / Self Care | Payer: Medicare Other | Source: Ambulatory Visit | Attending: Orthopaedic Surgery | Admitting: Orthopaedic Surgery

## 2012-03-03 DIAGNOSIS — M545 Low back pain: Secondary | ICD-10-CM

## 2012-09-20 ENCOUNTER — Encounter: Payer: Self-pay | Admitting: Neurosurgery

## 2012-09-21 ENCOUNTER — Ambulatory Visit: Payer: Medicare Other | Admitting: Neurosurgery

## 2012-09-21 ENCOUNTER — Encounter (INDEPENDENT_AMBULATORY_CARE_PROVIDER_SITE_OTHER): Payer: Medicare Other | Admitting: *Deleted

## 2012-09-21 ENCOUNTER — Other Ambulatory Visit: Payer: Self-pay | Admitting: *Deleted

## 2012-09-21 DIAGNOSIS — Z48812 Encounter for surgical aftercare following surgery on the circulatory system: Secondary | ICD-10-CM

## 2012-09-21 DIAGNOSIS — I714 Abdominal aortic aneurysm, without rupture: Secondary | ICD-10-CM

## 2012-09-23 ENCOUNTER — Encounter: Payer: Self-pay | Admitting: Vascular Surgery

## 2013-01-13 ENCOUNTER — Emergency Department (HOSPITAL_COMMUNITY)
Admission: EM | Admit: 2013-01-13 | Discharge: 2013-01-13 | Disposition: A | Discharge status: Home / Self Care | Payer: Medicare HMO | Attending: Emergency Medicine | Admitting: Emergency Medicine

## 2013-01-13 ENCOUNTER — Encounter (HOSPITAL_COMMUNITY): Payer: Self-pay | Admitting: Emergency Medicine

## 2013-01-13 DIAGNOSIS — Z79899 Other long term (current) drug therapy: Secondary | ICD-10-CM | POA: Insufficient documentation

## 2013-01-13 DIAGNOSIS — Z87891 Personal history of nicotine dependence: Secondary | ICD-10-CM | POA: Insufficient documentation

## 2013-01-13 DIAGNOSIS — Z8679 Personal history of other diseases of the circulatory system: Secondary | ICD-10-CM | POA: Insufficient documentation

## 2013-01-13 DIAGNOSIS — R51 Headache: Secondary | ICD-10-CM | POA: Insufficient documentation

## 2013-01-13 DIAGNOSIS — I499 Cardiac arrhythmia, unspecified: Secondary | ICD-10-CM | POA: Insufficient documentation

## 2013-01-13 DIAGNOSIS — G8929 Other chronic pain: Secondary | ICD-10-CM | POA: Insufficient documentation

## 2013-01-13 DIAGNOSIS — K029 Dental caries, unspecified: Secondary | ICD-10-CM | POA: Insufficient documentation

## 2013-01-13 DIAGNOSIS — I252 Old myocardial infarction: Secondary | ICD-10-CM | POA: Insufficient documentation

## 2013-01-13 DIAGNOSIS — N400 Enlarged prostate without lower urinary tract symptoms: Secondary | ICD-10-CM | POA: Insufficient documentation

## 2013-01-13 DIAGNOSIS — R011 Cardiac murmur, unspecified: Secondary | ICD-10-CM | POA: Insufficient documentation

## 2013-01-13 DIAGNOSIS — I10 Essential (primary) hypertension: Secondary | ICD-10-CM | POA: Insufficient documentation

## 2013-01-13 DIAGNOSIS — Z85828 Personal history of other malignant neoplasm of skin: Secondary | ICD-10-CM | POA: Insufficient documentation

## 2013-01-13 DIAGNOSIS — K047 Periapical abscess without sinus: Secondary | ICD-10-CM | POA: Insufficient documentation

## 2013-01-13 DIAGNOSIS — Z7901 Long term (current) use of anticoagulants: Secondary | ICD-10-CM | POA: Insufficient documentation

## 2013-01-13 DIAGNOSIS — I4891 Unspecified atrial fibrillation: Secondary | ICD-10-CM | POA: Insufficient documentation

## 2013-01-13 DIAGNOSIS — R220 Localized swelling, mass and lump, head: Secondary | ICD-10-CM | POA: Insufficient documentation

## 2013-01-13 DIAGNOSIS — K1370 Unspecified lesions of oral mucosa: Secondary | ICD-10-CM | POA: Insufficient documentation

## 2013-01-13 DIAGNOSIS — E785 Hyperlipidemia, unspecified: Secondary | ICD-10-CM | POA: Insufficient documentation

## 2013-01-13 HISTORY — DX: Other chronic pain: G89.29

## 2013-01-13 HISTORY — DX: Pain in right hip: M25.551

## 2013-01-13 LAB — BASIC METABOLIC PANEL
CO2: 28 mEq/L (ref 19–32)
Chloride: 101 mEq/L (ref 96–112)
Glucose, Bld: 112 mg/dL — ABNORMAL HIGH (ref 70–99)
Potassium: 3.9 mEq/L (ref 3.5–5.1)
Sodium: 135 mEq/L (ref 135–145)

## 2013-01-13 LAB — PROTIME-INR
INR: 1.78 — ABNORMAL HIGH (ref 0.00–1.49)
Prothrombin Time: 20.2 seconds — ABNORMAL HIGH (ref 11.6–15.2)

## 2013-01-13 LAB — CBC WITH DIFFERENTIAL/PLATELET
Lymphocytes Relative: 6 % — ABNORMAL LOW (ref 12–46)
Lymphs Abs: 0.6 10*3/uL — ABNORMAL LOW (ref 0.7–4.0)
MCV: 85.9 fL (ref 78.0–100.0)
Neutro Abs: 7.8 10*3/uL — ABNORMAL HIGH (ref 1.7–7.7)
Neutrophils Relative %: 84 % — ABNORMAL HIGH (ref 43–77)
Platelets: 130 10*3/uL — ABNORMAL LOW (ref 150–400)
RBC: 4.25 MIL/uL (ref 4.22–5.81)
WBC: 9.3 10*3/uL (ref 4.0–10.5)

## 2013-01-13 LAB — CG4 I-STAT (LACTIC ACID): Lactic Acid, Venous: 0.81 mmol/L (ref 0.5–2.2)

## 2013-01-13 MED ORDER — CLINDAMYCIN HCL 150 MG PO CAPS: 300.0000 mg | ORAL_CAPSULE | Freq: Three times a day (TID) | ORAL | Status: DC

## 2013-01-13 MED ORDER — CLINDAMYCIN HCL 300 MG PO CAPS: 300.0000 mg | ORAL_CAPSULE | Freq: Three times a day (TID) | ORAL | Status: DC

## 2013-01-13 MED ORDER — SODIUM CHLORIDE 0.9 % IV BOLUS (SEPSIS)
500.0000 mL | Freq: Once | INTRAVENOUS | Status: AC
  Administered 2013-01-13: 500 mL via INTRAVENOUS

## 2013-01-13 MED ORDER — CLINDAMYCIN PHOSPHATE 600 MG/50ML IV SOLN
600.0000 mg | Freq: Once | INTRAVENOUS | Status: AC
  Administered 2013-01-13: 600 mg via INTRAVENOUS
  Filled 2013-01-13: qty 50

## 2013-01-13 NOTE — ED Notes (Signed)
Dr Anitra Lauth requested to bedside.

## 2013-01-13 NOTE — ED Notes (Signed)
Patient states he saw a dentist in March, was told he had about 4 broken teeth and that they needed to be pulled. Patient declined the procedure at the time. Patient presents with Left sided facial swelling, and reported pus and bloody drainage from his mouth. Patient was "warm to the touch" last night. Patient went to an oral surgeon's office (Dr Marlane Mingle) and received a narcotic pain med, and was later prescribed a antibiotoic. Patient started his abx (Amoxicillin 875 BID) last night. Patient states he has an appointment on 01/19/13 for extractions. Family at bedside is concerned about patient's hydration status due to decreased intake.

## 2013-01-13 NOTE — ED Notes (Signed)
Patient A&O x3, from home. Patient went to oral surgeons office yesterday for evaluation of L sided facial swelling with drainage of pus and blood. Small amount noted to patient shirt when he was changing. Patient received abx and pain medications that he started last night. Patient did NOT see a provider at oral surgeons office. Patient then contacted his PCP this morning, and was referred here for further evaluation. Patient in NAD; only complaint of pain with swallowing 2/10 which clears after he swallows.

## 2013-01-13 NOTE — ED Notes (Addendum)
Multiple family members at bedside. Requested them to swap in and out with two visitors maximum.   Family requesting patient to receive food. A family member is a cardio-thoracic nurse and concerned "with his CHF that he needs to eat something". Patient denies any hx of CHF, but does present with dependent edema.  Advised I will check with physician.

## 2013-01-13 NOTE — ED Notes (Signed)
Vital signs stable. 

## 2013-01-13 NOTE — ED Notes (Signed)
Patient is resting comfortably. 

## 2013-01-13 NOTE — ED Notes (Signed)
Clindamycin not in pyxis, message sent to pharmacy.

## 2013-01-13 NOTE — ED Notes (Signed)
Family at bedside. 

## 2013-01-13 NOTE — ED Provider Notes (Addendum)
CSN: [id number]     Arrival date & time 01/13/13  1056 History     First MD Initiated Contact with Patient 01/13/13 1150     Chief Complaint  Patient presents with  . Dental Pain    with swelling, and drainage, fever reported from home   (Consider location/radiation/quality/duration/timing/severity/associated sxs/prior Treatment) HPI Comments: Patient with known broken teeth in March but declined having then pulled to presents today with 3 days of worsening left facial pain, swelling and drainage. Patient went to Dr. Madilyn Fireman office yesterday did not see a physician but was given a prescription for narcotics and antibiotics. He is taken to amoxicillin however the swelling and redness of his face has worsened. As the family states patient had a mechanical fall yesterday and has not been eating or drinking for the last 24 hours. During the fall he did not hit the floor but fell into his walker. He did not hit his head and had no loss of consciousness. Here patient's only complaint is some mild dental pain. He does have an appointment to have his teeth removed on 01/20/2023 because he'll need to stop his Coumadin prior to procedure.  Patient is a 79 y.o. male presenting with tooth pain. The history is provided by the patient, the spouse and a relative.  Dental Pain Location:  Upper Upper teeth location:  15/LU 2nd molar and 16/LU 3rd molar Quality:  Sharp and localized Severity:  Moderate Onset quality:  Gradual Duration:  3 days Timing:  Constant Progression:  Worsening Chronicity:  New Context: abscess and dental caries   Relieved by: to hydrocodone last night. Worsened by:  Cold food/drink, hot food/drink and jaw movement Associated symptoms: facial pain, facial swelling, gum swelling and oral bleeding   Associated symptoms: no congestion, no fever and no neck swelling   Risk factors: periodontal disease   Risk factors comment:  Hx of afib on coumadin   Past Medical History    Diagnosis Date  . Hypertension   . Hyperlipidemia   . Atrial fibrillation   . BPH (benign prostatic hypertrophy)   . Long term (current) use of anticoagulants   . Cancer     skin cancer ( Basal cell)  . Myocardial infarction 1993  . AAA (abdominal aortic aneurysm) 09/25/2009  . Chronic right hip pain    Past Surgical History  Procedure Laterality Date  . Abdominal aortic aneurysm repair    . Eye surgery     Family History  Problem Relation Age of Onset  . Stroke Father    History  Substance Use Topics  . Smoking status: Former Smoker    Quit date: 07/30/1991  . Smokeless tobacco: Not on file  . Alcohol Use: Yes     Comment: once per week     Review of Systems  Constitutional: Negative for fever.  HENT: Positive for facial swelling. Negative for congestion.   All other systems reviewed and are negative.    Allergies  Oxycodone hcl er and Ultram  Home Medications   Current Outpatient Rx  Name  Route  Sig  Dispense  Refill  . amoxicillin (AMOXIL) 875 MG tablet   Oral   Take 875 mg by mouth 2 (two) times daily.         . finasteride (PROSCAR) 5 MG tablet   Oral   Take 5 mg by mouth daily.          Marland Kitchen HYDROcodone-acetaminophen (NORCO) 7.5-325 MG per tablet   Oral  Take 1 tablet by mouth every 6 (six) hours as needed for pain.         Marland Kitchen lisinopril (PRINIVIL,ZESTRIL) 20 MG tablet   Oral   Take 20 mg by mouth daily.           . metoprolol (LOPRESSOR) 50 MG tablet   Oral   Take 25-50 mg by mouth 2 (two) times daily. Take 1 table ( 50mg  ) every morning and 1/2 tablet at night ( 25mg )         . simvastatin (ZOCOR) 40 MG tablet   Oral   Take 20 mg by mouth at bedtime.          Marland Kitchen terazosin (HYTRIN) 10 MG capsule   Oral   Take 10 mg by mouth at bedtime.          Marland Kitchen warfarin (COUMADIN) 5 MG tablet   Oral   Take 5 mg by mouth daily. Pt takes 5mg  on Monday Wednesday Friday. Pt takes 7.5mg  on Tuesday,thursday,satuday,sunday.          BP 143/75   Pulse 62  Temp(Src) 98 F (36.7 C) (Oral)  Resp 16  Ht 6\' 1"  (1.854 m)  Wt 235 lb (106.595 kg)  BMI 31.01 kg/m2  SpO2 93% Physical Exam  Nursing note and vitals reviewed. Constitutional: He is oriented to person, place, and time. He appears well-developed and well-nourished. No distress.  HENT:  Head: Normocephalic and atraumatic.  Mouth/Throat: Oropharynx is clear and moist. Dental abscesses and dental caries present.    Left upper cheek swelling and warmth  Eyes: Conjunctivae and EOM are normal. Pupils are equal, round, and reactive to light.  Neck: Normal range of motion. Neck supple.  Cardiovascular: Normal rate and intact distal pulses.  An irregularly irregular rhythm present.  Murmur heard. Pulmonary/Chest: Effort normal and breath sounds normal. No respiratory distress. He has no wheezes. He has no rales.  Abdominal: Soft. He exhibits no distension. There is no tenderness. There is no rebound and no guarding.  Musculoskeletal: Normal range of motion. He exhibits no edema and no tenderness.  Lymphadenopathy:    He has no cervical adenopathy.  Neurological: He is alert and oriented to person, place, and time.  Skin: Skin is warm and dry. No rash noted. No erythema.  Psychiatric: He has a normal mood and affect. His behavior is normal.    ED Course   Procedures (including critical care time)  Labs Reviewed  CBC WITH DIFFERENTIAL - Abnormal; Notable for the following:    Hemoglobin 11.6 (*)    HCT 36.5 (*)    RDW 15.6 (*)    Platelets 130 (*)    Neutrophils Relative % 84 (*)    Neutro Abs 7.8 (*)    Lymphocytes Relative 6 (*)    Lymphs Abs 0.6 (*)    All other components within normal limits  BASIC METABOLIC PANEL - Abnormal; Notable for the following:    Glucose, Bld 112 (*)    GFR calc non Af Amer 74 (*)    GFR calc Af Amer 85 (*)    All other components within normal limits  PROTIME-INR - Abnormal; Notable for the following:    Prothrombin Time 20.2 (*)      INR 1.78 (*)    All other components within normal limits  CG4 I-STAT (LACTIC ACID)   No results found. 1. Dental abscess     MDM   Patient with evidence of dental abscess who has taken  2 doses facility without improvement in symptoms. Pt with dental caries and facial swelling.  No signs of ludwig's angina and no systemic symptoms.  But pt will not eat as he states there is some tightness in his throat. Given pt's age, recent fall due to feeling weak and on coumadin will check labs to ensure normal hydration status. Pt given IV clinda  2:14 PM All labs wnl.  INR slightly subtherapeutic at 1.78 but given pt will be changed to clinda will just have him recheck on Monday.  Pt given strict return precautions but will d/c home.  Discussed with family who is present and everyone agrees with plan.  Gwyneth Sprout, MD 01/13/13 1415  Gwyneth Sprout, MD 01/13/13 1422

## 2013-09-26 ENCOUNTER — Encounter: Payer: Self-pay | Admitting: Vascular Surgery

## 2013-09-27 ENCOUNTER — Encounter: Payer: Self-pay | Admitting: Vascular Surgery

## 2013-09-27 ENCOUNTER — Ambulatory Visit (HOSPITAL_COMMUNITY)
Admission: RE | Admit: 2013-09-27 | Discharge: 2013-09-27 | Disposition: A | Discharge status: Home / Self Care | Payer: Medicare HMO | Source: Ambulatory Visit | Attending: Vascular Surgery | Admitting: Vascular Surgery

## 2013-09-27 ENCOUNTER — Ambulatory Visit (INDEPENDENT_AMBULATORY_CARE_PROVIDER_SITE_OTHER): Payer: Medicare HMO | Admitting: Vascular Surgery

## 2013-09-27 VITALS — BP 169/88 | HR 71 | Ht 73.0 in | Wt 225.0 lb

## 2013-09-27 DIAGNOSIS — I714 Abdominal aortic aneurysm, without rupture, unspecified: Secondary | ICD-10-CM

## 2013-09-27 DIAGNOSIS — Z48812 Encounter for surgical aftercare following surgery on the circulatory system: Secondary | ICD-10-CM

## 2013-09-27 NOTE — Assessment & Plan Note (Signed)
The patient underwent endovascular aneurysm repair in May of 2011. The stent graft is in good position and the aneurysm has decreased in size. He is 80 years old and I think a yearly duplex is sufficient to follow his graft. I'll see him back in one year. He knows to call sooner for has problems.

## 2013-09-27 NOTE — Progress Notes (Signed)
[facility] [place]  Patient name: Chad Buchanan. MRN: [id number] DOB: 07-13-1920 Sex: male  REASON FOR VISIT: Follow up after EVAR  HPI: Emmitte Surgeon. is a 80 y.o. male who was last seen in our office on 09/23/2011 by our nurse practitioner at that time. He had undergone an EVAR in May of 2011. This was for a 5.6 cm infrarenal abdominal aortic aneurysm. Comes in for yearly follow up visit.   Since I saw him last, he has had some hip pain which apparently is related to lumbar disc disease. He denies any abdominal pain. He does have some leg swelling but states that he does not like to elevate his legs. Been no significant changes in his medical history.  Past Medical History  Diagnosis Date  . Hypertension   . Hyperlipidemia   . Atrial fibrillation   . BPH (benign prostatic hypertrophy)   . Long term (current) use of anticoagulants   . Cancer     skin cancer ( Basal cell)  . Myocardial infarction 1993  . AAA (abdominal aortic aneurysm) 09/25/2009  . Chronic right hip pain    Family History  Problem Relation Age of Onset  . Stroke Father    SOCIAL HISTORY: History  Substance Use Topics  . Smoking status: Former Smoker    Quit date: 07/30/1991  . Smokeless tobacco: Not on file  . Alcohol Use: Yes     Comment: once per week    Allergies  Allergen Reactions  . Oxycodone Hcl     Made him sleep for 2 days  . Ultram [Tramadol Hcl]     oversedation   Current Outpatient Prescriptions  Medication Sig Dispense Refill  . finasteride (PROSCAR) 5 MG tablet Take 5 mg by mouth daily.       Marland Kitchen lisinopril (PRINIVIL,ZESTRIL) 20 MG tablet Take 20 mg by mouth daily.        . metoprolol (LOPRESSOR) 50 MG tablet Take 25-50 mg by mouth 2 (two) times daily. Take 1 table ( 50mg  ) every morning and 1/2 tablet at night ( 25mg )      . simvastatin (ZOCOR) 40 MG tablet Take 20 mg by mouth at bedtime.       Marland Kitchen terazosin (HYTRIN) 10 MG capsule Take 10 mg by mouth at  bedtime.       Marland Kitchen warfarin (COUMADIN) 5 MG tablet Take 5 mg by mouth daily. Pt takes 5mg  on Monday Wednesday Friday. Pt takes 7.5mg  on Tuesday,thursday,satuday,sunday.      Marland Kitchen amoxicillin (AMOXIL) 875 MG tablet Take 875 mg by mouth 2 (two) times daily.      . clindamycin (CLEOCIN) 300 MG capsule Take 1 capsule (300 mg total) by mouth 3 (three) times daily.  30 capsule  0  . HYDROcodone-acetaminophen (NORCO) 7.5-325 MG per tablet Take 1 tablet by mouth every 6 (six) hours as needed for pain.       No current facility-administered medications for this visit.   REVIEW OF SYSTEMS: Valu.Nieves ] denotes positive finding; [  ] denotes negative finding  CARDIOVASCULAR:  [ ]  chest pain   [ ]  chest pressure   [ ]  palpitations   [ ]  orthopnea   Valu.Nieves ] dyspnea on exertion   [ ]  claudication   [ ]  rest pain   [ ]  DVT   [ ]  phlebitis PULMONARY:   [ ]  productive cough   [ ]  asthma   [ ]  wheezing NEUROLOGIC:   [ ]   weakness  [ ]  paresthesias  [ ]  aphasia  [ ]  amaurosis  [ ]  dizziness HEMATOLOGIC:   [ ]  bleeding problems   [ ]  clotting disorders MUSCULOSKELETAL:  [ ]  joint pain   [ ]  joint swelling Valu.Nieves ] leg swelling GASTROINTESTINAL: [ ]   blood in stool  [ ]   hematemesis GENITOURINARY:  [ ]   dysuria  [ ]   hematuria PSYCHIATRIC:  [ ]  history of major depression INTEGUMENTARY:  [ ]  rashes  [ ]  ulcers CONSTITUTIONAL:  [ ]  fever   [ ]  chills  PHYSICAL EXAM: Filed Vitals:   09/27/13 0936  BP: 169/88  Pulse: 71  Height: 6\' 1"  (1.854 m)  Weight: 225 lb (102.059 kg)  SpO2: 96%   Body mass index is 29.69 kg/(m^2). GENERAL: The patient is a well-nourished male, in no acute distress. The vital signs are documented above. CARDIOVASCULAR: There is a regular rate and rhythm. I do not detect carotid bruits. He has palpable femoral pulses. Both feet are warm and well-perfused. He has significant bilateral lower extremity swelling. PULMONARY: There is good air exchange bilaterally without wheezing or rales. ABDOMEN: Soft and  non-tender with normal pitched bowel sounds.  MUSCULOSKELETAL: There are no major deformities or cyanosis. NEUROLOGIC: No focal weakness or paresthesias are detected. SKIN: he has a fungal rash in his right groin. PSYCHIATRIC: The patient has a normal affect.  DATA:  I have independently interpreted his duplex of his stent graft which shows that the aneurysm continues to decrease in size and is now 4.9 cm in maximum diameter.  MEDICAL ISSUES: AAA (abdominal aortic aneurysm) without rupture The patient underwent endovascular aneurysm repair in May of 2011. The stent graft is in good position and the aneurysm has decreased in size. He is 80 years old and I think a yearly duplex is sufficient to follow his graft. I'll see him back in one year. He knows to call sooner for has problems.   Return in about 1 year (around 09/28/2014).  Angelia Mould [facility] [place]: [phone]

## 2013-09-27 NOTE — Addendum Note (Signed)
Addended by: Mena Goes on: 09/27/2013 10:41 AM   Modules accepted: Orders

## 2013-11-01 ENCOUNTER — Ambulatory Visit: Payer: Self-pay | Admitting: Podiatrist

## 2014-06-14 DIAGNOSIS — Z7901 Long term (current) use of anticoagulants: Secondary | ICD-10-CM | POA: Diagnosis not present

## 2014-06-14 DIAGNOSIS — E785 Hyperlipidemia, unspecified: Secondary | ICD-10-CM | POA: Diagnosis not present

## 2014-06-14 DIAGNOSIS — I480 Paroxysmal atrial fibrillation: Secondary | ICD-10-CM | POA: Diagnosis not present

## 2014-07-18 DIAGNOSIS — Z7901 Long term (current) use of anticoagulants: Secondary | ICD-10-CM | POA: Diagnosis not present

## 2014-07-18 DIAGNOSIS — I480 Paroxysmal atrial fibrillation: Secondary | ICD-10-CM | POA: Diagnosis not present

## 2014-07-18 DIAGNOSIS — G459 Transient cerebral ischemic attack, unspecified: Secondary | ICD-10-CM | POA: Diagnosis not present

## 2014-08-21 DIAGNOSIS — Z7901 Long term (current) use of anticoagulants: Secondary | ICD-10-CM | POA: Diagnosis not present

## 2014-08-21 DIAGNOSIS — I480 Paroxysmal atrial fibrillation: Secondary | ICD-10-CM | POA: Diagnosis not present

## 2014-09-20 DIAGNOSIS — I480 Paroxysmal atrial fibrillation: Secondary | ICD-10-CM | POA: Diagnosis not present

## 2014-09-20 DIAGNOSIS — Z7901 Long term (current) use of anticoagulants: Secondary | ICD-10-CM | POA: Diagnosis not present

## 2014-10-02 ENCOUNTER — Encounter: Payer: Self-pay | Admitting: Vascular Surgery

## 2014-10-03 ENCOUNTER — Ambulatory Visit (HOSPITAL_COMMUNITY)
Admission: RE | Admit: 2014-10-03 | Discharge: 2014-10-03 | Disposition: A | Discharge status: Home / Self Care | Payer: Commercial Managed Care - HMO | Source: Ambulatory Visit | Attending: Vascular Surgery | Admitting: Vascular Surgery

## 2014-10-03 ENCOUNTER — Ambulatory Visit (INDEPENDENT_AMBULATORY_CARE_PROVIDER_SITE_OTHER): Payer: Commercial Managed Care - HMO | Admitting: Vascular Surgery

## 2014-10-03 ENCOUNTER — Encounter: Payer: Self-pay | Admitting: Vascular Surgery

## 2014-10-03 VITALS — BP 180/109 | HR 60 | Ht 73.0 in | Wt 212.0 lb

## 2014-10-03 DIAGNOSIS — Z48812 Encounter for surgical aftercare following surgery on the circulatory system: Secondary | ICD-10-CM

## 2014-10-03 DIAGNOSIS — I714 Abdominal aortic aneurysm, without rupture, unspecified: Secondary | ICD-10-CM

## 2014-10-03 NOTE — Addendum Note (Signed)
Addended by: Dorthula Rue L on: 10/03/2014 01:54 PM   Modules accepted: Orders

## 2014-10-03 NOTE — Progress Notes (Signed)
[facility] [place]  Patient name: Chad Buchanan. MRN: [id number] DOB: 1921-01-20 Sex: male  REASON FOR VISIT: Follow up after EVAR.  HPI: Chad Buchanan. is a 81 y.o. male who underwent endovascular aneurysm repair in May 2011 for a 5.6 cm infrarenal abdominal aortic aneurysm. He was last seen in our office one year ago at which point the aneurysm had decreased in size to 4.9 cm in maximum diameter. He comes in for a yearly duplex scan.  Since I saw him last he denies any history of abdominal pain or back pain. There have been no significant changes in his medical history. He is on a statin. He is not on aspirin because he is on Coumadin.  Past Medical History  Diagnosis Date  . Hypertension   . Hyperlipidemia   . Atrial fibrillation   . BPH (benign prostatic hypertrophy)   . Long term (current) use of anticoagulants   . Cancer     skin cancer ( Basal cell)  . Myocardial infarction 1993  . AAA (abdominal aortic aneurysm) 09/25/2009  . Chronic right hip pain    Family History  Problem Relation Age of Onset  . Stroke Father   . Diabetes Brother    SOCIAL HISTORY: History  Substance Use Topics  . Smoking status: Former Smoker    Quit date: 07/30/1991  . Smokeless tobacco: Not on file  . Alcohol Use: Yes     Comment: once per week    Allergies  Allergen Reactions  . Oxycodone Hcl     Made him sleep for 2 days  . Ultram [Tramadol Hcl]     oversedation   Current Outpatient Prescriptions  Medication Sig Dispense Refill  . amoxicillin (AMOXIL) 875 MG tablet Take 875 mg by mouth 2 (two) times daily.    . clindamycin (CLEOCIN) 300 MG capsule Take 1 capsule (300 mg total) by mouth 3 (three) times daily. 30 capsule 0  . finasteride (PROSCAR) 5 MG tablet Take 5 mg by mouth daily.     Marland Kitchen lisinopril (PRINIVIL,ZESTRIL) 20 MG tablet Take 20 mg by mouth daily.      . metoprolol (LOPRESSOR) 50 MG tablet Take 25-50 mg by mouth 2 (two) times daily. Take 1  table ( 50mg  ) every morning and 1/2 tablet at night ( 25mg )    . simvastatin (ZOCOR) 40 MG tablet Take 20 mg by mouth at bedtime.     Marland Kitchen terazosin (HYTRIN) 10 MG capsule Take 10 mg by mouth at bedtime.     Marland Kitchen warfarin (COUMADIN) 5 MG tablet Take 5 mg by mouth daily. Pt takes 5mg  on Monday Wednesday Friday. Pt takes 7.5mg  on Tuesday,thursday,satuday,sunday.    Marland Kitchen HYDROcodone-acetaminophen (NORCO) 7.5-325 MG per tablet Take 1 tablet by mouth every 6 (six) hours as needed for pain.     No current facility-administered medications for this visit.   REVIEW OF SYSTEMS: Valu.Nieves ] denotes positive finding; [  ] denotes negative finding  CARDIOVASCULAR:  [ ]  chest pain   [ ]  chest pressure   [ ]  palpitations   [ ]  orthopnea   [ ]  dyspnea on exertion   [ ]  claudication   [ ]  rest pain   [ ]  DVT   [ ]  phlebitis PULMONARY:   [ ]  productive cough   [ ]  asthma   [ ]  wheezing NEUROLOGIC:   [ ]  weakness  [ ]  paresthesias  [ ]  aphasia  [ ]  amaurosis  [ ]   dizziness HEMATOLOGIC:   [ ]  bleeding problems   [ ]  clotting disorders MUSCULOSKELETAL:  [ ]  joint pain   [ ]  joint swelling [ ]  leg swelling GASTROINTESTINAL: [ ]   blood in stool  [ ]   hematemesis GENITOURINARY:  [ ]   dysuria  [ ]   hematuria PSYCHIATRIC:  [ ]  history of major depression INTEGUMENTARY:  [ ]  rashes  [ ]  ulcers CONSTITUTIONAL:  [ ]  fever   [ ]  chills  PHYSICAL EXAM: Filed Vitals:   10/03/14 0942 10/03/14 0945  BP: 154/83 180/109  Pulse: 60   Height: 6\' 1"  (1.854 m)   Weight: 212 lb (96.163 kg)   SpO2: 96%    GENERAL: The patient is a well-nourished male, in no acute distress. The vital signs are documented above. CARDIOVASCULAR: There is a regular rate and rhythm. He has a systolic murmur. I do not detect carotid bruits. He has palpable femoral pulses. He has bilateral lower extremity swelling. PULMONARY: There is good air exchange bilaterally without wheezing or rales. ABDOMEN: Soft and non-tender with normal pitched bowel sounds.    MUSCULOSKELETAL: There are no major deformities or cyanosis. NEUROLOGIC: No focal weakness or paresthesias are detected. SKIN: There are no ulcers or rashes noted. PSYCHIATRIC: The patient has a normal affect.  DATA:  I have reviewed the duplex scan today which shows that the maximum diameter of the aneurysm is 4.6 cm.  MEDICAL ISSUES: ABDOMINAL AORTIC ANEURYSM: The patient is status post EVAR  In the aneurysm continues to shrink in size. This was 5.6 cm to start and is decreased in size to 4.6 cm. I think it is safe to stretch his follow up after 18 months I have ordered a follow duplex scan for that time. He will see our nurse practitioner at that time.   Return in about 18 months (around 04/04/2016).  Deitra Mayo [facility] [place]: [phone]

## 2014-10-23 DIAGNOSIS — I10 Essential (primary) hypertension: Secondary | ICD-10-CM | POA: Diagnosis not present

## 2014-10-23 DIAGNOSIS — R7301 Impaired fasting glucose: Secondary | ICD-10-CM | POA: Diagnosis not present

## 2014-10-23 DIAGNOSIS — I2510 Atherosclerotic heart disease of native coronary artery without angina pectoris: Secondary | ICD-10-CM | POA: Diagnosis not present

## 2014-10-23 DIAGNOSIS — Z6830 Body mass index (BMI) 30.0-30.9, adult: Secondary | ICD-10-CM | POA: Diagnosis not present

## 2014-10-23 DIAGNOSIS — Z1389 Encounter for screening for other disorder: Secondary | ICD-10-CM | POA: Diagnosis not present

## 2014-10-23 DIAGNOSIS — I480 Paroxysmal atrial fibrillation: Secondary | ICD-10-CM | POA: Diagnosis not present

## 2014-10-23 DIAGNOSIS — Z7901 Long term (current) use of anticoagulants: Secondary | ICD-10-CM | POA: Diagnosis not present

## 2014-11-23 DIAGNOSIS — I480 Paroxysmal atrial fibrillation: Secondary | ICD-10-CM | POA: Diagnosis not present

## 2014-11-23 DIAGNOSIS — Z7901 Long term (current) use of anticoagulants: Secondary | ICD-10-CM | POA: Diagnosis not present

## 2014-12-06 DIAGNOSIS — H10503 Unspecified blepharoconjunctivitis, bilateral: Secondary | ICD-10-CM | POA: Diagnosis not present

## 2014-12-21 DIAGNOSIS — Z7901 Long term (current) use of anticoagulants: Secondary | ICD-10-CM | POA: Diagnosis not present

## 2014-12-21 DIAGNOSIS — I480 Paroxysmal atrial fibrillation: Secondary | ICD-10-CM | POA: Diagnosis not present

## 2015-01-02 ENCOUNTER — Encounter: Payer: Self-pay | Admitting: Podiatry

## 2015-01-02 ENCOUNTER — Ambulatory Visit: Payer: Self-pay | Admitting: Podiatry

## 2015-01-02 ENCOUNTER — Ambulatory Visit (INDEPENDENT_AMBULATORY_CARE_PROVIDER_SITE_OTHER): Payer: Commercial Managed Care - HMO | Admitting: Podiatry

## 2015-01-02 VITALS — BP 134/79 | HR 78 | Resp 19

## 2015-01-02 DIAGNOSIS — M79676 Pain in unspecified toe(s): Secondary | ICD-10-CM

## 2015-01-02 DIAGNOSIS — B351 Tinea unguium: Secondary | ICD-10-CM

## 2015-01-02 NOTE — Progress Notes (Signed)
Patient ID: Chad Buchanan., male   DOB: 1920/08/06, 81 y.o.   MRN: [id number] Complaint:  Visit Type: Patient returns to my office for continued preventative foot care services. Complaint: Patient states" my nails have grown long and thick and become painful to walk and wear shoes.,. The patient presents for preventative foot care services. No changes to ROS  Podiatric Exam: Vascular: dorsalis pedis and posterior tibial pulses are not  palpable bilateral due to severe foot swelling.. Capillary return is immediate. Temperature gradient is WNL. Skin turgor WNL  Sensorium: Normal Semmes Weinstein monofilament test. Normal tactile sensation bilaterally. Nail Exam: Pt has thick disfigured discolored nails with subungual debris noted bilateral entire nail hallux through fifth toenails Ulcer Exam: There is no evidence of ulcer or pre-ulcerative changes or infection. Orthopedic Exam: Muscle tone and strength are WNL. No limitations in general ROM. No crepitus or effusions noted. Foot type and digits show no abnormalities. Bony prominences are unremarkable. Skin: No Porokeratosis. No infection or ulcers  Diagnosis:  Onychomycosis, , Pain in right toe, pain in left toes  Treatment & Plan Procedures and Treatment: Consent by patient was obtained for treatment procedures. The patient understood the discussion of treatment and procedures well. All questions were answered thoroughly reviewed. Debridement of mycotic and hypertrophic toenails, 1 through 5 bilateral and clearing of subungual debris. No ulceration, no infection noted.  Return Visit-Office Procedure: Patient instructed to return to the office for a follow up visit 3 months for continued evaluation and treatment.

## 2015-01-07 DIAGNOSIS — D2212 Melanocytic nevi of left eyelid, including canthus: Secondary | ICD-10-CM | POA: Diagnosis not present

## 2015-01-07 DIAGNOSIS — H02132 Senile ectropion of right lower eyelid: Secondary | ICD-10-CM | POA: Diagnosis not present

## 2015-01-07 DIAGNOSIS — H26493 Other secondary cataract, bilateral: Secondary | ICD-10-CM | POA: Diagnosis not present

## 2015-01-07 DIAGNOSIS — H35033 Hypertensive retinopathy, bilateral: Secondary | ICD-10-CM | POA: Diagnosis not present

## 2015-01-07 DIAGNOSIS — Z961 Presence of intraocular lens: Secondary | ICD-10-CM | POA: Diagnosis not present

## 2015-01-21 DIAGNOSIS — Z7901 Long term (current) use of anticoagulants: Secondary | ICD-10-CM | POA: Diagnosis not present

## 2015-01-21 DIAGNOSIS — I480 Paroxysmal atrial fibrillation: Secondary | ICD-10-CM | POA: Diagnosis not present

## 2015-03-11 ENCOUNTER — Inpatient Hospital Stay (HOSPITAL_COMMUNITY)
Admission: EM | Admit: 2015-03-11 | Discharge: 2015-03-15 | DRG: 291 | Disposition: A | Discharge status: Skilled Nursing Facility | Payer: Commercial Managed Care - HMO | Attending: Internal Medicine | Admitting: Internal Medicine

## 2015-03-11 ENCOUNTER — Encounter (HOSPITAL_COMMUNITY): Payer: Self-pay | Admitting: Family Medicine

## 2015-03-11 ENCOUNTER — Emergency Department (HOSPITAL_COMMUNITY): Payer: Commercial Managed Care - HMO

## 2015-03-11 DIAGNOSIS — I480 Paroxysmal atrial fibrillation: Secondary | ICD-10-CM | POA: Diagnosis not present

## 2015-03-11 DIAGNOSIS — B372 Candidiasis of skin and nail: Secondary | ICD-10-CM | POA: Diagnosis not present

## 2015-03-11 DIAGNOSIS — I2510 Atherosclerotic heart disease of native coronary artery without angina pectoris: Secondary | ICD-10-CM | POA: Diagnosis present

## 2015-03-11 DIAGNOSIS — M6281 Muscle weakness (generalized): Secondary | ICD-10-CM | POA: Diagnosis not present

## 2015-03-11 DIAGNOSIS — I10 Essential (primary) hypertension: Secondary | ICD-10-CM | POA: Diagnosis not present

## 2015-03-11 DIAGNOSIS — N400 Enlarged prostate without lower urinary tract symptoms: Secondary | ICD-10-CM | POA: Diagnosis present

## 2015-03-11 DIAGNOSIS — I5031 Acute diastolic (congestive) heart failure: Secondary | ICD-10-CM

## 2015-03-11 DIAGNOSIS — R2689 Other abnormalities of gait and mobility: Secondary | ICD-10-CM | POA: Diagnosis not present

## 2015-03-11 DIAGNOSIS — Z7901 Long term (current) use of anticoagulants: Secondary | ICD-10-CM

## 2015-03-11 DIAGNOSIS — Z6833 Body mass index (BMI) 33.0-33.9, adult: Secondary | ICD-10-CM | POA: Diagnosis not present

## 2015-03-11 DIAGNOSIS — N5089 Other specified disorders of the male genital organs: Secondary | ICD-10-CM | POA: Diagnosis present

## 2015-03-11 DIAGNOSIS — R278 Other lack of coordination: Secondary | ICD-10-CM | POA: Diagnosis not present

## 2015-03-11 DIAGNOSIS — R0602 Shortness of breath: Secondary | ICD-10-CM | POA: Diagnosis not present

## 2015-03-11 DIAGNOSIS — I110 Hypertensive heart disease with heart failure: Principal | ICD-10-CM | POA: Diagnosis present

## 2015-03-11 DIAGNOSIS — D684 Acquired coagulation factor deficiency: Secondary | ICD-10-CM | POA: Diagnosis not present

## 2015-03-11 DIAGNOSIS — Z85828 Personal history of other malignant neoplasm of skin: Secondary | ICD-10-CM

## 2015-03-11 DIAGNOSIS — I252 Old myocardial infarction: Secondary | ICD-10-CM

## 2015-03-11 DIAGNOSIS — H109 Unspecified conjunctivitis: Secondary | ICD-10-CM | POA: Diagnosis not present

## 2015-03-11 DIAGNOSIS — L308 Other specified dermatitis: Secondary | ICD-10-CM | POA: Diagnosis not present

## 2015-03-11 DIAGNOSIS — I482 Chronic atrial fibrillation, unspecified: Secondary | ICD-10-CM | POA: Diagnosis present

## 2015-03-11 DIAGNOSIS — R609 Edema, unspecified: Secondary | ICD-10-CM | POA: Diagnosis not present

## 2015-03-11 DIAGNOSIS — I4891 Unspecified atrial fibrillation: Secondary | ICD-10-CM | POA: Diagnosis not present

## 2015-03-11 DIAGNOSIS — I509 Heart failure, unspecified: Secondary | ICD-10-CM | POA: Diagnosis not present

## 2015-03-11 DIAGNOSIS — Z79899 Other long term (current) drug therapy: Secondary | ICD-10-CM | POA: Diagnosis not present

## 2015-03-11 DIAGNOSIS — E785 Hyperlipidemia, unspecified: Secondary | ICD-10-CM | POA: Diagnosis present

## 2015-03-11 DIAGNOSIS — H1089 Other conjunctivitis: Secondary | ICD-10-CM | POA: Diagnosis not present

## 2015-03-11 DIAGNOSIS — J9601 Acute respiratory failure with hypoxia: Secondary | ICD-10-CM | POA: Diagnosis not present

## 2015-03-11 DIAGNOSIS — I5033 Acute on chronic diastolic (congestive) heart failure: Secondary | ICD-10-CM | POA: Diagnosis not present

## 2015-03-11 LAB — PROTIME-INR
INR: 3.41 — AB (ref 0.00–1.49)
PROTHROMBIN TIME: 33.7 s — AB (ref 11.6–15.2)

## 2015-03-11 LAB — COMPREHENSIVE METABOLIC PANEL
ALK PHOS: 77 U/L (ref 38–126)
ALT: 16 U/L — ABNORMAL LOW (ref 17–63)
ANION GAP: 9 (ref 5–15)
AST: 25 U/L (ref 15–41)
Albumin: 3.3 g/dL — ABNORMAL LOW (ref 3.5–5.0)
BUN: 21 mg/dL — ABNORMAL HIGH (ref 6–20)
CHLORIDE: 107 mmol/L (ref 101–111)
CO2: 26 mmol/L (ref 22–32)
Calcium: 9.2 mg/dL (ref 8.9–10.3)
Creatinine, Ser: 1.09 mg/dL (ref 0.61–1.24)
GFR, EST NON AFRICAN AMERICAN: 57 mL/min — AB (ref >60)
Glucose, Bld: 157 mg/dL — ABNORMAL HIGH (ref 65–99)
Potassium: 4.4 mmol/L (ref 3.5–5.1)
SODIUM: 142 mmol/L (ref 135–145)
Total Bilirubin: 1.3 mg/dL — ABNORMAL HIGH (ref 0.3–1.2)
Total Protein: 6.3 g/dL — ABNORMAL LOW (ref 6.5–8.1)

## 2015-03-11 LAB — CBC
HCT: 35.9 % — ABNORMAL LOW (ref 39.0–52.0)
HEMOGLOBIN: 11.1 g/dL — AB (ref 13.0–17.0)
MCH: 27.2 pg (ref 26.0–34.0)
MCHC: 30.9 g/dL (ref 30.0–36.0)
MCV: 88 fL (ref 78.0–100.0)
PLATELETS: 112 10*3/uL — AB (ref 150–400)
RBC: 4.08 MIL/uL — AB (ref 4.22–5.81)
RDW: 17.4 % — ABNORMAL HIGH (ref 11.5–15.5)
WBC: 5.2 10*3/uL (ref 4.0–10.5)

## 2015-03-11 LAB — BRAIN NATRIURETIC PEPTIDE: B NATRIURETIC PEPTIDE 5: 331.5 pg/mL — AB (ref 0.0–100.0)

## 2015-03-11 LAB — I-STAT TROPONIN, ED: TROPONIN I, POC: 0.01 ng/mL (ref 0.00–0.08)

## 2015-03-11 MED ORDER — LISINOPRIL 20 MG PO TABS: 20.0000 mg | ORAL_TABLET | Freq: Every day | ORAL | Status: DC

## 2015-03-11 MED ORDER — LISINOPRIL 20 MG PO TABS
20.0000 mg | ORAL_TABLET | Freq: Every day | ORAL | Status: DC
  Administered 2015-03-12 – 2015-03-15 (×4): 20 mg via ORAL
  Filled 2015-03-11 (×4): qty 1

## 2015-03-11 MED ORDER — ACETAMINOPHEN 325 MG PO TABS: 650.0000 mg | ORAL_TABLET | ORAL | Status: DC | PRN

## 2015-03-11 MED ORDER — GATIFLOXACIN 0.5 % OP SOLN
1.0000 [drp] | Freq: Four times a day (QID) | OPHTHALMIC | Status: DC
  Administered 2015-03-11 – 2015-03-15 (×14): 1 [drp] via OPHTHALMIC
  Filled 2015-03-11: qty 2.5

## 2015-03-11 MED ORDER — FUROSEMIDE 10 MG/ML IJ SOLN
40.0000 mg | Freq: Once | INTRAMUSCULAR | Status: AC
  Administered 2015-03-11: 40 mg via INTRAVENOUS
  Filled 2015-03-11: qty 4

## 2015-03-11 MED ORDER — SODIUM CHLORIDE 0.9 % IV SOLN: 250.0000 mL | INTRAVENOUS | Status: DC | PRN

## 2015-03-11 MED ORDER — FUROSEMIDE 10 MG/ML IJ SOLN: 40.0000 mg | Freq: Two times a day (BID) | INTRAMUSCULAR | Status: DC

## 2015-03-11 MED ORDER — WARFARIN - PHARMACIST DOSING INPATIENT
Freq: Every day | Status: DC
  Administered 2015-03-12 – 2015-03-14 (×3)

## 2015-03-11 MED ORDER — METOPROLOL TARTRATE 50 MG PO TABS
50.0000 mg | ORAL_TABLET | Freq: Every day | ORAL | Status: DC
  Administered 2015-03-12 – 2015-03-13 (×2): 50 mg via ORAL
  Filled 2015-03-11 (×2): qty 1

## 2015-03-11 MED ORDER — WARFARIN SODIUM 5 MG PO TABS: 5.0000 mg | ORAL_TABLET | ORAL | Status: DC

## 2015-03-11 MED ORDER — NYSTATIN 100000 UNIT/GM EX POWD
Freq: Three times a day (TID) | CUTANEOUS | Status: DC
  Administered 2015-03-11 – 2015-03-13 (×7): via TOPICAL
  Administered 2015-03-14: 1 via TOPICAL
  Administered 2015-03-14 – 2015-03-15 (×3): via TOPICAL
  Filled 2015-03-11: qty 15

## 2015-03-11 MED ORDER — ONDANSETRON HCL 4 MG/2ML IJ SOLN
4.0000 mg | Freq: Four times a day (QID) | INTRAMUSCULAR | Status: DC | PRN
Start: 2015-03-11 — End: 2015-03-15

## 2015-03-11 MED ORDER — METOPROLOL TARTRATE 25 MG PO TABS
25.0000 mg | ORAL_TABLET | Freq: Every day | ORAL | Status: DC
  Administered 2015-03-11 – 2015-03-12 (×2): 25 mg via ORAL
  Filled 2015-03-11 (×2): qty 1

## 2015-03-11 MED ORDER — FINASTERIDE 5 MG PO TABS
5.0000 mg | ORAL_TABLET | Freq: Every day | ORAL | Status: DC
  Administered 2015-03-11 – 2015-03-14 (×4): 5 mg via ORAL
  Filled 2015-03-11 (×5): qty 1

## 2015-03-11 MED ORDER — TERAZOSIN HCL 5 MG PO CAPS
10.0000 mg | ORAL_CAPSULE | Freq: Every day | ORAL | Status: DC
  Administered 2015-03-11 – 2015-03-14 (×4): 10 mg via ORAL
  Filled 2015-03-11 (×6): qty 2

## 2015-03-11 MED ORDER — SODIUM CHLORIDE 0.9 % IJ SOLN
3.0000 mL | INTRAMUSCULAR | Status: DC | PRN
  Administered 2015-03-13 – 2015-03-15 (×2): 3 mL via INTRAVENOUS
  Filled 2015-03-11 (×2): qty 3

## 2015-03-11 MED ORDER — SIMVASTATIN 20 MG PO TABS
20.0000 mg | ORAL_TABLET | Freq: Every day | ORAL | Status: DC
  Administered 2015-03-11 – 2015-03-14 (×4): 20 mg via ORAL
  Filled 2015-03-11 (×4): qty 1

## 2015-03-11 MED ORDER — SODIUM CHLORIDE 0.9 % IJ SOLN
3.0000 mL | Freq: Two times a day (BID) | INTRAMUSCULAR | Status: DC
  Administered 2015-03-11 – 2015-03-14 (×6): 3 mL via INTRAVENOUS

## 2015-03-11 MED ORDER — METOPROLOL TARTRATE 25 MG PO TABS
25.0000 mg | ORAL_TABLET | Freq: Two times a day (BID) | ORAL | Status: DC
Start: 2015-03-11 — End: 2015-03-11

## 2015-03-11 NOTE — H&P (Signed)
[facility] History and Physical  Chad Buchanan. HER:[id number] DOB: March 12, 1921 DOA: 03/11/2015    PCP: Jerlyn Ly, MD    Chief Complaint: "too much fluid on my body"  HPI: Chad Buchanan. is a 81 y.o. male 81 year old male with past medical history of atrial fibrillation on Coumadin, hypertension, hyperlipidemia, BPH who presents from his PCPs office for fluid overload. The patient is a poor historian but does admit to being short of breath with exertion when asked about it. No complaints of chest pain palpitations or cough. He is mainly concerned about his legs being swollen for the past week. Per notes from PCP office, he is recently gained 15 pounds. No prior history of congestive heart failure.  General: The patient denies anorexia, fever, weight loss Cardiac: Denies chest pain, syncope, palpitations, pedal edema  Respiratory: Denies cough, shortness of breath, wheezing GI: Denies severe indigestion/heartburn, abdominal pain, nausea, vomiting, diarrhea and constipation GU: Denies hematuria, incontinence, dysuria  Musculoskeletal: Denies arthritis  Skin: Denies suspicious skin lesions Neurologic: Denies focal weakness or numbness, change in vision Psychiatry: Denies depression or anxiety. Hematologic: no easy bruising or bleeding  All other systems reviewed and found to be negative.  Past Medical History  Diagnosis Date  . Hypertension   . Hyperlipidemia   . Atrial fibrillation ([place])   . BPH (benign prostatic hypertrophy)   . Long term (current) use of anticoagulants   . Cancer ([place])     skin cancer ( Basal cell)  . Myocardial infarction ([place]) 1993  . AAA (abdominal aortic aneurysm) ([place]) 09/25/2009  . Chronic right hip pain     Past Surgical History  Procedure Laterality Date  . Abdominal aortic aneurysm repair    . Eye surgery      Social History: Does not smoke or drink alcohol Lives at home with his wife.    Allergies  Allergen Reactions  .  Oxycodone Hcl     Made him sleep for 2 days  . Ultram [Tramadol Hcl]     oversedation    Family history:  Family History  Problem Relation Age of Onset  . Stroke Father   . Diabetes Brother      Prior to Admission medications   Medication Sig Start Date End Date Taking? Authorizing Provider  finasteride (PROSCAR) 5 MG tablet Take 5 mg by mouth daily.    Yes Historical Provider, MD  lisinopril (PRINIVIL,ZESTRIL) 20 MG tablet Take 20 mg by mouth daily.     Yes Historical Provider, MD  metoprolol (LOPRESSOR) 50 MG tablet Take 25-50 mg by mouth 2 (two) times daily. Take 1 table ( 50mg  ) every morning and 1/2 tablet at night ( 25mg )   Yes Historical Provider, MD  neomycin-polymyxin b-dexamethasone (MAXITROL) 3.5-10000-0.1 OINT APPLY 1 APPLICATION TO OU D 8/56/31  Yes Historical Provider, MD  simvastatin (ZOCOR) 40 MG tablet Take 20 mg by mouth at bedtime.    Yes Historical Provider, MD  terazosin (HYTRIN) 10 MG capsule Take 10 mg by mouth at bedtime.    Yes Historical Provider, MD  warfarin (COUMADIN) 5 MG tablet Take 5-7.5 mg by mouth See admin instructions. Pt takes 5mg  every day except on Sunday and Wednesday patient takes 7.5 mg 05/07/11  Yes Crist Infante, MD     Physical Exam: Filed Vitals:   03/11/15 1600 03/11/15 1615 03/11/15 1630 03/11/15 1645  BP: 105/55 132/81 130/72 130/68  Pulse: 65 73 71 83  Temp:      TempSrc:  Resp: 20 23 19 20   Height:      Weight:      SpO2: 95% 96% 96% 96%     General: Elderly man eating up in bed in no acute distress HEENT: Normocephalic and Atraumatic, Mucous membranes pink                PERRLA; EOM intact; No scleral icterus, right eye-yellow discharge at inner canthus, ectropion of lower eyelid and conjunctival inflammation                Nares: Patent, Oropharynx: Clear, Fair Dentition                 Neck: FROM, no cervical lymphadenopathy, thyromegaly, carotid bruit or JVD;  Breasts: deferred CHEST WALL: No tenderness  CHEST:  Faint crackles at bases-oxygen saturation 94% on 2 L HEART: IIRR; no murmurs rubs or gallops  BACK: No kyphosis or scoliosis; no CVA tenderness  GI: Positive Bowel Sounds, soft, non-tender; no masses, no organomegaly Rectal Exam: deferred MSK: No cyanosis, clubbing- severe lower extremity pitting edema Genitalia: Significant edema of penis and scrotum along with increased erythema SKIN:  no rash or ulceration  CNS: Alert and Oriented x 4, Nonfocal exam, CN 2-12 intact  Labs on Admission:  Basic Metabolic Panel:  Recent Labs Lab 03/11/15 1445  NA 142  K 4.4  CL 107  CO2 26  GLUCOSE 157*  BUN 21*  CREATININE 1.09  CALCIUM 9.2   Liver Function Tests:  Recent Labs Lab 03/11/15 1445  AST 25  ALT 16*  ALKPHOS 77  BILITOT 1.3*  PROT 6.3*  ALBUMIN 3.3*   No results for input(s): LIPASE, AMYLASE in the last 168 hours. No results for input(s): AMMONIA in the last 168 hours. CBC:  Recent Labs Lab 03/11/15 1445  WBC 5.2  HGB 11.1*  HCT 35.9*  MCV 88.0  PLT 112*   Cardiac Enzymes: No results for input(s): CKTOTAL, CKMB, CKMBINDEX, TROPONINI in the last 168 hours.  BNP (last 3 results)  Recent Labs  03/11/15 1645  BNP 331.5*    ProBNP (last 3 results) No results for input(s): PROBNP in the last 8760 hours.  CBG: No results for input(s): GLUCAP in the last 168 hours.  Radiological Exams on Admission: Dg Chest 2 View  03/11/2015  CLINICAL DATA:  Shortness of breath EXAM: CHEST  2 VIEW COMPARISON:  05/04/2011 FINDINGS: Coarse interstitial opacities diffusely, likely progressed from prior but chronic. Interstitial lung disease seen at the bases seen on 2012 abdominal CT. There is a rounded opacity overlapping the elevated right diaphragm in the lateral projection. Chronic cardiomegaly and vascular pedicle widening. No overt edema or effusion. No air leak. IMPRESSION: 1. Basilar opacity overlapping the spine, possible pneumonia. 2. Interstitial lung disease with  mild progression since 2012. Electronically Signed   By: Monte Fantasia M.D.   On: 03/11/2015 15:45    EKG: Independently reviewed. Atrial fibrillation heart rate 97 bpm, left anterior Fascicular block  Assessment/Plan Principal Problem:   Acute respiratory failure with hypoxia - Congestive heart failure, unspecified - Lasix 40 mg IV every 12 hours- already on ACE inhibitor -will continue -monitor I&O and daily weights -obtain 2-D echo -wean oxygen as able  Active Problems:   Atrial fibrillation CHA2DS2-VASc Score at least 3  -Rate currently controlled -Continue metoprolol -hold Coumadin as INR is supratherapeutic  Acquired Coagulopathy - hold Coumadin    Essential hypertension - Continue lisinopril and metoprolol    Candidal dermatitis -Nystatin  Right sided Conjunctivitis -Gatifloxacillin drops  BPH - cont Proscar and Terazosin   Consulted:   Code Status: full code  Family Communication:   DVT Prophylaxis:Coumadin  Time spent: 17  Eleva, MD [facility]  If 7PM-7AM, please contact night-coverage [email or web address] 03/11/2015, 5:49 PM

## 2015-03-11 NOTE — Progress Notes (Signed)
Pt admitted to floor at 1845. Vitals done, telebox on. Pt weighed. Pt stable and A&Ox4. Will report to next shift.

## 2015-03-11 NOTE — Progress Notes (Signed)
ANTICOAGULATION CONSULT NOTE - Initial Consult  Pharmacy Consult for Coumadin Indication: Afib  Allergies  Allergen Reactions  . Oxycodone Hcl     Made him sleep for 2 days  . Ultram [Tramadol Hcl]     oversedation    Patient Measurements: Height: 6\' 1"  (185.4 cm) Weight: 232 lb 6.4 oz (105.416 kg) IBW/kg (Calculated) : 79.9  Vital Signs: Temp: 97.3 F (36.3 C) (10/17 1854) Temp Source: Oral (10/17 1854) BP: 160/80 mmHg (10/17 1854) Pulse Rate: 84 (10/17 1854)  Labs:  Recent Labs  03/11/15 1445 03/11/15 1655  HGB 11.1*  --   HCT 35.9*  --   PLT 112*  --   LABPROT  --  33.7*  INR  --  3.41*  CREATININE 1.09  --     Estimated Creatinine Clearance: 55.1 mL/min (by C-G formula based on Cr of 1.09).   Medical History: Past Medical History  Diagnosis Date  . Hypertension   . Hyperlipidemia   . Atrial fibrillation ([place])   . BPH (benign prostatic hypertrophy)   . Long term (current) use of anticoagulants   . Cancer ([place])     skin cancer ( Basal cell)  . Myocardial infarction ([place]) 1993  . AAA (abdominal aortic aneurysm) ([place]) 09/25/2009  . Chronic right hip pain     Assessment: 81 yo M presents on 10/17 with SOB. PMH includes Afib, pharmacy consulted to continue Coumadin. PTA Coumadin is 5mg  daily exc 7.5mg  on Wed. Last dose was taken on 10/16. INR on admit today is supratherapeutic at 3.41. Hgb 11.1, plts 112. No s/s of bleed  Goal of Therapy:  INR 2-3 Monitor platelets by anticoagulation protocol: Yes   Plan:  Hold Coumadin today Monitor daily INR, CBC, s/s of bleed  Elenor Quinones, PharmD Clinical Pharmacist Pager [phone] 03/11/2015 7:03 PM

## 2015-03-11 NOTE — ED Provider Notes (Signed)
CSN: [id number]     Arrival date & time 03/11/15  1405 History   First MD Initiated Contact with Patient 03/11/15 1432     Chief Complaint  Patient presents with  . Shortness of Breath     (Consider location/radiation/quality/duration/timing/severity/associated sxs/prior Treatment) HPI Comments: 81yo male presents from PCP office for concern of worsening congestive heart failure. Her PCPs note patient has gained greater than 15 pounds, was acutely short of breath and hypoxic in the office.  PCP had initiated oral Lasix with patient on Friday, however does not feel that he is improved. Per patient he doesn't feel his shortness of breath is significantly worse from baseline, however is concerned regarding the worsening swelling of his bilateral extremities and scrotum.  Reports chronic leg swelling, however worsening of legs and specifically scrotal edema in last week Patient is a 81 y.o. male presenting with shortness of breath.  Shortness of Breath Severity:  Mild Onset quality:  Gradual Duration: chronic however mildly worsened in last 2 wks. Timing:  Constant Chronicity:  Chronic Context: not URI   Relieved by:  Nothing Worsened by:  Nothing tried Ineffective treatments:  None tried Associated symptoms: no abdominal pain, no chest pain, no cough, no fever, no headaches, no rash, no sore throat, no syncope and no vomiting   Risk factors: no hx of PE/DVT and no recent surgery     Past Medical History  Diagnosis Date  . Hypertension   . Hyperlipidemia   . Atrial fibrillation ([place])   . BPH (benign prostatic hypertrophy)   . Long term (current) use of anticoagulants   . Cancer ([place])     skin cancer ( Basal cell)  . Myocardial infarction ([place]) 1993  . AAA (abdominal aortic aneurysm) ([place]) 09/25/2009  . Chronic right hip pain    Past Surgical History  Procedure Laterality Date  . Abdominal aortic aneurysm repair    . Eye surgery     Family History  Problem Relation Age of  Onset  . Stroke Father   . Diabetes Brother    Social History  Substance Use Topics  . Smoking status: Former Smoker    Quit date: 07/30/1991  . Smokeless tobacco: None  . Alcohol Use: Yes     Comment: once per week     Review of Systems  Constitutional: Negative for fever.  HENT: Negative for sore throat.   Eyes: Negative for visual disturbance.  Respiratory: Positive for shortness of breath. Negative for cough.   Cardiovascular: Positive for leg swelling. Negative for chest pain and syncope.  Gastrointestinal: Negative for nausea, vomiting and abdominal pain.  Genitourinary: Positive for penile swelling and scrotal swelling. Negative for difficulty urinating.  Musculoskeletal: Negative for back pain and neck stiffness.  Skin: Negative for rash.  Neurological: Negative for syncope and headaches.      Allergies  Oxycodone hcl and Ultram  Home Medications   Prior to Admission medications   Medication Sig Start Date End Date Taking? Authorizing Provider  finasteride (PROSCAR) 5 MG tablet Take 5 mg by mouth daily.    Yes Historical Provider, MD  lisinopril (PRINIVIL,ZESTRIL) 20 MG tablet Take 20 mg by mouth daily.     Yes Historical Provider, MD  metoprolol (LOPRESSOR) 50 MG tablet Take 25-50 mg by mouth 2 (two) times daily. Take 1 table ( 50mg  ) every morning and 1/2 tablet at night ( 25mg )   Yes Historical Provider, MD  neomycin-polymyxin b-dexamethasone (MAXITROL) 3.5-10000-0.1 OINT APPLY 1 APPLICATION TO OU D  12/06/14  Yes Historical Provider, MD  simvastatin (ZOCOR) 40 MG tablet Take 20 mg by mouth at bedtime.    Yes Historical Provider, MD  terazosin (HYTRIN) 10 MG capsule Take 10 mg by mouth at bedtime.    Yes Historical Provider, MD  warfarin (COUMADIN) 5 MG tablet Take 5-7.5 mg by mouth See admin instructions. Pt takes 5mg  every day except on Sunday and Wednesday patient takes 7.5 mg 05/07/11  Yes Crist Infante, MD   BP 142/77 mmHg  Pulse 84  Temp(Src) 97.3 F (36.3  C) (Oral)  Resp 18  Ht 6\' 1"  (1.854 m)  Wt 232 lb 6.4 oz (105.416 kg)  BMI 30.67 kg/m2  SpO2 98% Physical Exam  Constitutional: He is oriented to person, place, and time. He appears well-developed and well-nourished. No distress.  HENT:  Head: Normocephalic and atraumatic.  Eyes: Conjunctivae and EOM are normal.  Neck: Normal range of motion. JVD present.  Cardiovascular: Normal rate, regular rhythm, normal heart sounds and intact distal pulses.  Exam reveals no gallop and no friction rub.   No murmur heard. Pulmonary/Chest: Effort normal and breath sounds normal. No respiratory distress. He has no wheezes. He has no rales.  Abdominal: Soft. He exhibits no distension. There is no tenderness. There is no guarding.  Genitourinary: Right testis shows swelling. Left testis shows swelling.  Penile and scrotal edema Mild erythema Inguinal erythema  Musculoskeletal: He exhibits edema (3+ bilateral).  Neurological: He is alert and oriented to person, place, and time.  Skin: Skin is warm and dry. He is not diaphoretic.  Nursing note and vitals reviewed.   ED Course  Procedures (including critical care time) Labs Review Labs Reviewed  CBC - Abnormal; Notable for the following:    RBC 4.08 (*)    Hemoglobin 11.1 (*)    HCT 35.9 (*)    RDW 17.4 (*)    Platelets 112 (*)    All other components within normal limits  COMPREHENSIVE METABOLIC PANEL - Abnormal; Notable for the following:    Glucose, Bld 157 (*)    BUN 21 (*)    Total Protein 6.3 (*)    Albumin 3.3 (*)    ALT 16 (*)    Total Bilirubin 1.3 (*)    GFR calc non Af Amer 57 (*)    All other components within normal limits  PROTIME-INR - Abnormal; Notable for the following:    Prothrombin Time 33.7 (*)    INR 3.41 (*)    All other components within normal limits  BRAIN NATRIURETIC PEPTIDE - Abnormal; Notable for the following:    B Natriuretic Peptide 331.5 (*)    All other components within normal limits  BASIC  METABOLIC PANEL  CBC  PROTIME-INR  I-STAT TROPOININ, ED    Imaging Review Dg Chest 2 View  03/11/2015  CLINICAL DATA:  Shortness of breath EXAM: CHEST  2 VIEW COMPARISON:  05/04/2011 FINDINGS: Coarse interstitial opacities diffusely, likely progressed from prior but chronic. Interstitial lung disease seen at the bases seen on 2012 abdominal CT. There is a rounded opacity overlapping the elevated right diaphragm in the lateral projection. Chronic cardiomegaly and vascular pedicle widening. No overt edema or effusion. No air leak. IMPRESSION: 1. Basilar opacity overlapping the spine, possible pneumonia. 2. Interstitial lung disease with mild progression since 2012. Electronically Signed   By: Monte Fantasia M.D.   On: 03/11/2015 15:45   I have personally reviewed and evaluated these images and lab results as part of  my medical decision-making.   EKG Interpretation   Date/Time:  Monday March 11 2015 14:26:59 EDT Ventricular Rate:  97 PR Interval:    QRS Duration: 106 QT Interval:  380 QTC Calculation: 482 R Axis:   -57 Text Interpretation:  Atrial fibrillation with premature ventricular or  aberrantly conducted complexes Left anterior fascicular block Nonspecific  T wave abnormality Abnormal ECG Since last tracing, rate has increased  Confirmed by [facility] MD, Ivelis Norgard ([id number]) on 03/11/2015 3:23:58 PM Also  confirmed by Lacinda Axon  MD, BRIAN ([id number])  on 03/11/2015 6:32:22 PM      MDM   Final diagnoses:  Acute respiratory failure with hypoxia (HCC)  Acute congestive heart failure, unspecified congestive heart failure type ([place])  Candidal dermatitis   81 year old male with a history of congestive heart failure, atrial fibrillation on Coumadin, hyperlipidemia and hypertension, BPH presents from PCPs office for concern of CHF exacerbation. Patient has gained greater than 15 pounds, and was hypoxic in the office. On arrival to the emergency department he is satting 90% on room air and is  mildly tachypneic. He has 3+ bilateral pitting edema, and scrotal/penile edema.  He has signs of scrotal and inguinal candidiasis, however have low suspicion for overlying cellulitis, abscess, or fornieur's gangrene by history and exam.  XR more concerning for pulmonary fibrosis, with area question pneumonia, however pt afebrile, with no history of cough, and no leukocytosis and have low suspicion clinically for pneumonia.  Patient's does show signs of pulmonary fibrosis and with significant lower extremity and scrotal edema, and JVD there is concern for right-sided heart failure and question presence of pulmonary hypertension.  Given some signs for volume overload, patient was given 40 mg of IV Lasix, and will be admitted to the hospitalist for observation and continued care.  Gareth Morgan, MD 03/11/15 2200

## 2015-03-11 NOTE — ED Notes (Signed)
Pt sent here by his doctor for SOB and CHF. Pt has been treated with oral diuresis without problems. Pt SOB and hypoxic.

## 2015-03-12 ENCOUNTER — Inpatient Hospital Stay (HOSPITAL_COMMUNITY): Payer: Commercial Managed Care - HMO

## 2015-03-12 DIAGNOSIS — I5031 Acute diastolic (congestive) heart failure: Secondary | ICD-10-CM

## 2015-03-12 DIAGNOSIS — H109 Unspecified conjunctivitis: Secondary | ICD-10-CM

## 2015-03-12 DIAGNOSIS — I509 Heart failure, unspecified: Secondary | ICD-10-CM

## 2015-03-12 LAB — CBC
HEMATOCRIT: 32.4 % — AB (ref 39.0–52.0)
HEMOGLOBIN: 9.9 g/dL — AB (ref 13.0–17.0)
MCH: 27.1 pg (ref 26.0–34.0)
MCHC: 30.6 g/dL (ref 30.0–36.0)
MCV: 88.8 fL (ref 78.0–100.0)
Platelets: 108 10*3/uL — ABNORMAL LOW (ref 150–400)
RBC: 3.65 MIL/uL — ABNORMAL LOW (ref 4.22–5.81)
RDW: 17.3 % — AB (ref 11.5–15.5)
WBC: 4.2 10*3/uL (ref 4.0–10.5)

## 2015-03-12 LAB — BASIC METABOLIC PANEL
ANION GAP: 7 (ref 5–15)
BUN: 20 mg/dL (ref 6–20)
CHLORIDE: 105 mmol/L (ref 101–111)
CO2: 32 mmol/L (ref 22–32)
Calcium: 9.1 mg/dL (ref 8.9–10.3)
Creatinine, Ser: 1.03 mg/dL (ref 0.61–1.24)
GFR calc Af Amer: >60 mL/min (ref >60)
GFR calc non Af Amer: >60 mL/min (ref >60)
GLUCOSE: 88 mg/dL (ref 65–99)
POTASSIUM: 4.5 mmol/L (ref 3.5–5.1)
Sodium: 144 mmol/L (ref 135–145)

## 2015-03-12 LAB — PROTIME-INR
INR: 3.29 — AB (ref 0.00–1.49)
Prothrombin Time: 32.8 seconds — ABNORMAL HIGH (ref 11.6–15.2)

## 2015-03-12 MED ORDER — FUROSEMIDE 10 MG/ML IJ SOLN
40.0000 mg | Freq: Two times a day (BID) | INTRAMUSCULAR | Status: DC
  Administered 2015-03-12 – 2015-03-15 (×7): 40 mg via INTRAVENOUS
  Filled 2015-03-12 (×7): qty 4

## 2015-03-12 MED ORDER — WARFARIN SODIUM 2.5 MG PO TABS
2.5000 mg | ORAL_TABLET | Freq: Once | ORAL | Status: AC
  Administered 2015-03-12: 2.5 mg via ORAL
  Filled 2015-03-12: qty 1

## 2015-03-12 MED ORDER — ALBUTEROL SULFATE (2.5 MG/3ML) 0.083% IN NEBU
2.5000 mg | INHALATION_SOLUTION | Freq: Four times a day (QID) | RESPIRATORY_TRACT | Status: DC | PRN
  Administered 2015-03-12 (×2): 2.5 mg via RESPIRATORY_TRACT
  Filled 2015-03-12 (×2): qty 3

## 2015-03-12 NOTE — Progress Notes (Signed)
Utilization review completed. Coyt Govoni, RN, BSN. 

## 2015-03-12 NOTE — Evaluation (Signed)
Physical Therapy Evaluation Patient Details Name: Chad Buchanan. MRN: [id number] DOB: 22-Jul-1920 Today's Date: 03/12/2015   History of Present Illness  Pt is a 81 y/o M who presented for fluid overload and admitted w/ acute respiratory failure w/ hypoxia and afib.  Pt's PMH includes afib, MI, AAA, chronic Rt hip pain.  Clinical Impression  Pt admitted with above diagnosis. Pt currently with functional limitations due to the deficits listed below (see PT Problem List). Pt has severely dec awareness of balance deficits and when asked what he plans to do if he loses his balance at home he says that he won't fall at home.  Explained to pt and pt's wife the potential consequences of a fall at home and both agree that either 24/7 supervision will be needed at home or pt will need to go SNF.  He required mod assist to prevent fall while ambulating in hallway this session. Pt will benefit from skilled PT to increase their independence and safety with mobility to allow discharge to the venue listed below.      Follow Up Recommendations SNF;Supervision for mobility/OOB    Equipment Recommendations  None recommended by PT    Recommendations for Other Services       Precautions / Restrictions Precautions Precautions: Fall Precaution Comments: Pt is very impulsive Restrictions Weight Bearing Restrictions: No      Mobility  Bed Mobility               General bed mobility comments: Pt sitting EOB w/ RN upon PT arrival  Transfers Overall transfer level: Needs assistance Equipment used: Rolling walker (2 wheeled) Transfers: Sit to/from Stand Sit to Stand: Min assist         General transfer comment: Min assist for stability as pt is impulsive and off balance during sit<>stand  Ambulation/Gait Ambulation/Gait assistance: Mod assist;+2 safety/equipment Ambulation Distance (Feet): 80 Feet Assistive device: Rolling walker (2 wheeled) Gait Pattern/deviations: Step-through  pattern;Staggering left;Staggering right;Antalgic;Narrow base of support;Trunk flexed   Gait velocity interpretation: at or above normal speed for age/gender General Gait Details: Erratic and requires mod assist x3 to prevent fall as pt is very impulsive and moving quickly in hallway.  He staggers Lt and Rt and does not slow pace w/ cues.  Verbal cues provided to stand upright as pt w/ tendency to push RW too far ahead.  Stairs            Wheelchair Mobility    Modified Rankin (Stroke Patients Only)       Balance Overall balance assessment: Needs assistance;History of Falls Sitting-balance support: Bilateral upper extremity supported;Feet supported Sitting balance-[name]: Good     Standing balance support: Bilateral upper extremity supported;During functional activity Standing balance-[name]: Poor Standing balance comment: Instability noted standing EOB                              Pertinent Vitals/Pain Pain Assessment: No/denies pain    Home Living Family/patient expects to be discharged to:: Unsure Living Arrangements: Spouse/significant other Available Help at Discharge: Available PRN/intermittently;Friend(s);Neighbor (wife unable to assist physically, she uses rollator for amb) Type of Home: House Home Access: Level entry     Home Layout: Multi-level;Able to live on main level with bedroom/bathroom (3 stories) Home Equipment: Walker - 2 wheels;Cane - single point Additional Comments: Pt's wife unable to assist physically.  Pt reports that he has 10 children that live in [place] but none  that lives nearby.  He says, "I have people who could come by" but cannot report who.  Pt reports fall 3 months ago when he stepped wrong.    Prior Function Level of Independence: Independent with assistive device(s)         Comments: PTA pt reports he used RW in home and cane in community.  He was Ind w/ all ADLs.     Hand Dominance         Extremity/Trunk Assessment   Upper Extremity Assessment: Generalized weakness           Lower Extremity Assessment: Generalized weakness;RLE deficits/detail;LLE deficits/detail RLE Deficits / Details: Bil LE edema LLE Deficits / Details: Bil LE edema  Cervical / Trunk Assessment: Kyphotic  Communication   Communication: HOH  Cognition Arousal/Alertness: Awake/alert Behavior During Therapy: Impulsive;Restless Overall Cognitive Status: Within Functional Limits for tasks assessed                      General Comments General comments (skin integrity, edema, etc.): Pt has severely dec awareness of balance deficits and when asked what he plans to do if he loses his balance at home he says that he won't fall at home.  Explained to pt and pt's wife the potential consequences of a fall at home and both agree that either 24/7 supervision will be needed at home or pt will need to go SNF.      Exercises General Exercises - Lower Extremity Ankle Circles/Pumps: AROM;Both;10 reps;Seated Long Arc Quad: AROM;Both;10 reps;Seated      Assessment/Plan    PT Assessment Patient needs continued PT services  PT Diagnosis Generalized weakness;Abnormality of gait   PT Problem List Decreased strength;Decreased range of motion;Decreased activity tolerance;Decreased balance;Decreased mobility;Decreased coordination;Decreased knowledge of use of DME;Decreased safety awareness;Decreased knowledge of precautions  PT Treatment Interventions DME instruction;Gait training;Functional mobility training;Stair training;Therapeutic activities;Therapeutic exercise;Balance training;Neuromuscular re-education;Patient/family education   PT Goals (Current goals can be found in the Care Plan section) Acute Rehab PT Goals Patient Stated Goal: to go home PT Goal Formulation: With patient/family Time For Goal Achievement: 04/02/15 Potential to Achieve Goals: Good    Frequency Min 3X/week   Barriers to  discharge Decreased caregiver support no assist avaialble at home as wife is unable to provide necessary assist    Co-evaluation               End of Session Equipment Utilized During Treatment: Gait belt Activity Tolerance: Patient tolerated treatment well;Patient limited by fatigue Patient left: in chair;with call bell/phone within reach;with chair alarm set;with family/visitor present Nurse Communication: Mobility status;Precautions;Other (comment) (pt very impulsive and unsteady; D/C plan)         Time: 2831-5176 PT Time Calculation (min) (ACUTE ONLY): 26 min   Charges:   PT Evaluation $Initial PT Evaluation Tier I: 1 Procedure PT Treatments $Gait Training: 8-22 mins   PT G CodesJoslyn Hy PT, DPT [phone] Pager: [phone] 03/12/2015, 4:11 PM

## 2015-03-12 NOTE — Progress Notes (Signed)
ANTICOAGULATION CONSULT NOTE - Follow Up Consult  Pharmacy Consult for warfarin Indication: atrial fibrillation  Allergies  Allergen Reactions  . Oxycodone Hcl     Made him sleep for 2 days  . Ultram [Tramadol Hcl]     oversedation    Patient Measurements: Height: 6\' 1"  (185.4 cm) Weight: 229 lb 3.2 oz (103.964 kg) (scale b) IBW/kg (Calculated) : 79.9  Vital Signs: Temp: 97.5 F (36.4 C) (10/18 0748) Temp Source: Oral (10/18 0748) BP: 135/78 mmHg (10/18 1212) Pulse Rate: 67 (10/18 1212)  Labs:  Recent Labs  03/11/15 1445 03/11/15 1655 03/12/15 0502  HGB 11.1*  --  9.9*  HCT 35.9*  --  32.4*  PLT 112*  --  108*  LABPROT  --  33.7* 32.8*  INR  --  3.41* 3.29*  CREATININE 1.09  --  1.03    Estimated Creatinine Clearance: 57.9 mL/min (by C-G formula based on Cr of 1.03).  Assessment: 81 y/o male admitted for HF exacerbation who takes chronic warfarin for Afib. INR is above goal at 3.29 but trending down - will resume at lower dose tonight. No bleeding noted, Hb and platelets low - will monitor.  PTA: 5 mg daily EXCEPT 7.5 mg Wed  Goal of Therapy:  INR 2-3 Monitor platelets by anticoagulation protocol: Yes   Plan:  - Warfarin 2.5 mg PO tonight - INR daily - Monitor for s/sx of bleeding  [facility], Pharm.D., BCPS Clinical Pharmacist Pager: [phone] 03/12/2015 2:23 PM

## 2015-03-12 NOTE — Progress Notes (Signed)
PROGRESS NOTE  Chad Buchanan. IPJ:[id number] DOB: 12-12-1920 DOA: 03/11/2015 PCP: Jerlyn Ly, MD  Brief history 81 year old male with a history of atrial fibrillation, hypertension, hyperlipidemia, coronary artery disease with history of MI, BPH presented from his primary care provider's office because of shortness of breath and greater than 15 pound weight gain. The patient was noted to be hypoxic in the office. As a result, the patient was sent to the emergency department. The patient was initiated on oral furosemide on 03/08/2015, but there was no significant improvement. The patient is a poor historian, but is able to tell me that he has noted increasing lower extremity edema and scrotal edema over the past week with some shortness of breath. He denies any chest pain, fevers, chills, coughing, hemoptysis.  Assessment/Plan: Acute respiratory failure with hypoxia  -Secondary to CHF  -Wean oxygen for oxygen saturation greater than 92%  -Presently stable on 2 L  Acute diastolic CHF -73/41/9379 weight 212 pounds -03/11/2015 weight 232 pounds -Daily weights -Continue intravenous furosemide 40 mg twice a day -03/12/2015 echo--EF 50-50 percent, no wall motion and mobility, severe LAE, moderate TR, mild MR, mild AS Atrial fibrillation -Rate controlled -Continue metoprolol tartrate -Continue warfarin  -CHADS-VASc = 5 Hypertension  -Continue lisinopril and metoprolol tartrate  BPH  -Continue Hytrin for now  -Continue finasteride  Hyperlipidemia  -Continue statin Conjunctivitis  -Continue gatifloxacin ophthalmic    Family Communication:   Pt at beside Disposition Plan:   Home when medically stable       Procedures/Studies: Dg Chest 2 View  03/11/2015  CLINICAL DATA:  Shortness of breath EXAM: CHEST  2 VIEW COMPARISON:  05/04/2011 FINDINGS: Coarse interstitial opacities diffusely, likely progressed from prior but chronic. Interstitial lung disease seen at the  bases seen on 2012 abdominal CT. There is a rounded opacity overlapping the elevated right diaphragm in the lateral projection. Chronic cardiomegaly and vascular pedicle widening. No overt edema or effusion. No air leak. IMPRESSION: 1. Basilar opacity overlapping the spine, possible pneumonia. 2. Interstitial lung disease with mild progression since 2012. Electronically Signed   By: Monte Fantasia M.D.   On: 03/11/2015 15:45         Subjective: Patient states that he is breathing better but still complains of edema. Denies any fevers, chills, chest pain, nausea, vomiting, diarrhea, pelvic pain. No dysuria or hematuria. No rashes.   Objective: Filed Vitals:   03/12/15 0035 03/12/15 0431 03/12/15 0748 03/12/15 1212  BP:  170/74 133/73 135/78  Pulse:  66 68 67  Temp:  97.8 F (36.6 C) 97.5 F (36.4 C)   TempSrc:  Oral Oral   Resp:  18 18 18   Height:      Weight:  103.964 kg (229 lb 3.2 oz)    SpO2: 98% 98% 95% 92%    Intake/Output Summary (Last 24 hours) at 03/12/15 1636 Last data filed at 03/12/15 1516  Gross per 24 hour  Intake    360 ml  Output   2450 ml  Net  -2090 ml   Weight change:  Exam:   General:  Pt is alert, follows commands appropriately, not in acute distress  HEENT: No icterus, No thrush, No neck mass, [place]/AT  Cardiovascular: IRRR, S1/S2, no rubs, no gallops  Respiratory: bibasilar crackles. No wheezing. Good air movement   Abdomen: Soft/+BS, non tender, non distended, no guarding; no hepatosplenomegaly   Extremities: 2 + LE edema, No lymphangitis, No petechiae, No  rashes, no synovitis; no cyanosis or clubbing   Data Reviewed: Basic Metabolic Panel:  Recent Labs Lab 03/11/15 1445 03/12/15 0502  NA 142 144  K 4.4 4.5  CL 107 105  CO2 26 32  GLUCOSE 157* 88  BUN 21* 20  CREATININE 1.09 1.03  CALCIUM 9.2 9.1   Liver Function Tests:  Recent Labs Lab 03/11/15 1445  AST 25  ALT 16*  ALKPHOS 77  BILITOT 1.3*  PROT 6.3*  ALBUMIN 3.3*    No results for input(s): LIPASE, AMYLASE in the last 168 hours. No results for input(s): AMMONIA in the last 168 hours. CBC:  Recent Labs Lab 03/11/15 1445 03/12/15 0502  WBC 5.2 4.2  HGB 11.1* 9.9*  HCT 35.9* 32.4*  MCV 88.0 88.8  PLT 112* 108*   Cardiac Enzymes: No results for input(s): CKTOTAL, CKMB, CKMBINDEX, TROPONINI in the last 168 hours. BNP: Invalid input(s): POCBNP CBG: No results for input(s): GLUCAP in the last 168 hours.  No results found for this or any previous visit (from the past 240 hour(s)).   Scheduled Meds: . finasteride  5 mg Oral Daily  . furosemide  40 mg Intravenous BID  . gatifloxacin  1 drop Right Eye QID  . lisinopril  20 mg Oral Daily  . metoprolol  50 mg Oral Daily  . metoprolol tartrate  25 mg Oral QHS  . nystatin   Topical TID  . simvastatin  20 mg Oral QHS  . sodium chloride  3 mL Intravenous Q12H  . terazosin  10 mg Oral QHS  . warfarin  2.5 mg Oral ONCE-1800  . Warfarin - Pharmacist Dosing Inpatient   Does not apply q1800   Continuous Infusions:    Monisha Siebel, DO  [facility] Pager [phone]  If 7PM-7AM, please contact night-coverage [email or web address] Password [email or web address] 03/12/2015, 4:36 PM   LOS: 1 day

## 2015-03-12 NOTE — Progress Notes (Signed)
Echocardiogram 2D Echocardiogram has been performed.  Chad Buchanan 03/12/2015, 12:03 PM

## 2015-03-13 LAB — PROTIME-INR
INR: 2.63 — ABNORMAL HIGH (ref 0.00–1.49)
Prothrombin Time: 27.7 seconds — ABNORMAL HIGH (ref 11.6–15.2)

## 2015-03-13 LAB — BASIC METABOLIC PANEL
Anion gap: 7 (ref 5–15)
BUN: 20 mg/dL (ref 6–20)
CHLORIDE: 101 mmol/L (ref 101–111)
CO2: 34 mmol/L — AB (ref 22–32)
CREATININE: 1.15 mg/dL (ref 0.61–1.24)
Calcium: 9 mg/dL (ref 8.9–10.3)
GFR calc Af Amer: >60 mL/min (ref >60)
GFR calc non Af Amer: 53 mL/min — ABNORMAL LOW (ref >60)
GLUCOSE: 93 mg/dL (ref 65–99)
POTASSIUM: 3.6 mmol/L (ref 3.5–5.1)
Sodium: 142 mmol/L (ref 135–145)

## 2015-03-13 LAB — CBC
HEMATOCRIT: 32.6 % — AB (ref 39.0–52.0)
Hemoglobin: 9.7 g/dL — ABNORMAL LOW (ref 13.0–17.0)
MCH: 26.4 pg (ref 26.0–34.0)
MCHC: 29.8 g/dL — ABNORMAL LOW (ref 30.0–36.0)
MCV: 88.8 fL (ref 78.0–100.0)
PLATELETS: 121 10*3/uL — AB (ref 150–400)
RBC: 3.67 MIL/uL — ABNORMAL LOW (ref 4.22–5.81)
RDW: 17.2 % — ABNORMAL HIGH (ref 11.5–15.5)
WBC: 5 10*3/uL (ref 4.0–10.5)

## 2015-03-13 MED ORDER — METOPROLOL TARTRATE 12.5 MG HALF TABLET
12.5000 mg | ORAL_TABLET | Freq: Two times a day (BID) | ORAL | Status: DC
  Administered 2015-03-14 – 2015-03-15 (×3): 12.5 mg via ORAL
  Filled 2015-03-13 (×3): qty 1

## 2015-03-13 MED ORDER — WARFARIN SODIUM 7.5 MG PO TABS
7.5000 mg | ORAL_TABLET | Freq: Once | ORAL | Status: AC
Start: 2015-03-13 — End: 2015-03-13
  Administered 2015-03-13: 7.5 mg via ORAL
  Filled 2015-03-13: qty 1

## 2015-03-13 NOTE — Progress Notes (Signed)
Page by RN to notify that pt's HR bradycardic 30-50.  Pt is sleeping, asymptomatic.  Will hold metoprolol dosing for remainder of day.  Decrease metoprolol dose to 12.5mg  bid, to restart am 03/14/15  DTat

## 2015-03-13 NOTE — Progress Notes (Addendum)
ANTICOAGULATION CONSULT NOTE - Follow Up Consult  Pharmacy Consult for warfarin Indication: atrial fibrillation  Allergies  Allergen Reactions  . Oxycodone Hcl     Made him sleep for 2 days  . Ultram [Tramadol Hcl]     oversedation    Patient Measurements: Height: 6\' 1"  (185.4 cm) Weight: 223 lb 6.4 oz (101.334 kg) IBW/kg (Calculated) : 79.9  Vital Signs: Temp: 97.9 F (36.6 C) (10/19 0801) Temp Source: Oral (10/19 0801) BP: 148/72 mmHg (10/19 0801) Pulse Rate: 84 (10/19 0801)  Labs:  Recent Labs  03/11/15 1445 03/11/15 1655 03/12/15 0502 03/13/15 0330  HGB 11.1*  --  9.9* 9.7*  HCT 35.9*  --  32.4* 32.6*  PLT 112*  --  108* 121*  LABPROT  --  33.7* 32.8* 27.7*  INR  --  3.41* 3.29* 2.63*  CREATININE 1.09  --  1.03 1.15    Estimated Creatinine Clearance: 51.3 mL/min (by C-G formula based on Cr of 1.15).  Assessment: 81 y/o male admitted for HF exacerbation who takes chronic warfarin for Afib. INR has trended back down into the therapeutic range at 2.63. No bleeding noted, Hb is low stable, platelets up to 121.  PTA: 5 mg daily EXCEPT 7.5 mg Wed/Sun  Goal of Therapy:  INR 2-3 Monitor platelets by anticoagulation protocol: Yes   Plan:  - Warfarin 7.5 mg PO tonight as per home dosing - INR daily - Monitor for s/sx of bleeding  [facility], Pharm.D., BCPS Clinical Pharmacist Pager: [phone] 03/13/2015 11:03 AM

## 2015-03-13 NOTE — Progress Notes (Signed)
PROGRESS NOTE  Chad Buchanan. JOA:[id number] DOB: [birth year] DOA: 03/11/2015 PCP: Jerlyn Ly, MD  Brief history 81 year old male with a history of atrial fibrillation, hypertension, hyperlipidemia, coronary artery disease with history of MI, BPH presented from his primary care provider's office because of shortness of breath and greater than 15 pound weight gain. The patient was noted to be hypoxic in the office. As a result, the patient was sent to the emergency department. The patient was initiated on oral furosemide on 03/08/2015, but there was no significant improvement. The patient is a poor historian, but is able to tell me that he has noted increasing lower extremity edema and scrotal edema over the past week with some shortness of breath.  Pt was started on lasix with good clinical effect  Assessment/Plan: Acute respiratory failure with hypoxia  -Secondary to CHF  -Wean oxygen for oxygen saturation greater than 92%  -Presently stable on 2 L  Acute diastolic CHF -60/02/9322 weight 212 pounds -03/11/2015 weight 232 pounds -Daily weights--NEG 9 pounds since admission--question accuracy of initial weight -NEG 2.5 L -Continue intravenous furosemide 40 mg twice a day -03/12/2015 echo--EF 50-50 percent, no wall motion and mobility, severe LAE, moderate TR, mild MR, mild AS Atrial fibrillation -Rate controlled -Continue metoprolol tartrate -Continue warfarin  -CHADS-VASc = 5 Hypertension  -Continue lisinopril and metoprolol tartrate  BPH  -Continue Hytrin for now  -Continue finasteride  Hyperlipidemia  -Continue statin Conjunctivitis  -Continue gatifloxacin ophthalmic   Family Communication: wife updated at beside Disposition Plan: SNF in 2-3 days     Procedures/Studies: Dg Chest 2 View  03/11/2015  CLINICAL DATA:  Shortness of breath EXAM: CHEST  2 VIEW COMPARISON:  05/04/2011 FINDINGS: Coarse interstitial opacities diffusely, likely  progressed from prior but chronic. Interstitial lung disease seen at the bases seen on 2012 abdominal CT. There is a rounded opacity overlapping the elevated right diaphragm in the lateral projection. Chronic cardiomegaly and vascular pedicle widening. No overt edema or effusion. No air leak. IMPRESSION: 1. Basilar opacity overlapping the spine, possible pneumonia. 2. Interstitial lung disease with mild progression since 2012. Electronically Signed   By: Monte Fantasia M.D.   On: 03/11/2015 15:45         Subjective:   Objective: Filed Vitals:   03/13/15 0100 03/13/15 0541 03/13/15 0801 03/13/15 1104  BP: 124/74 154/98 148/72 111/64  Pulse: 70 83 84 82  Temp: 98 F (36.7 C) 98 F (36.7 C) 97.9 F (36.6 C) 98.1 F (36.7 C)  TempSrc: Oral Oral Oral Oral  Resp: 18 18 18 18   Height:      Weight:  101.334 kg (223 lb 6.4 oz)    SpO2: 93% 96% 97% 92%    Intake/Output Summary (Last 24 hours) at 03/13/15 1144 Last data filed at 03/13/15 1115  Gross per 24 hour  Intake    960 ml  Output   2010 ml  Net  -1050 ml   Weight change: -5.46 kg (-12 lb 0.6 oz) Exam:   General:  Pt is alert, follows commands appropriately, not in acute distress  HEENT: No icterus, No thrush, No neck mass, [place]/AT  Cardiovascular: RRR, S1/S2, no rubs, no gallops  Respiratory: CTA bilaterally, no wheezing, no crackles, no rhonchi  Abdomen: Soft/+BS, non tender, non distended, no guarding  Extremities: No edema, No lymphangitis, No petechiae, No rashes, no synovitis  Data Reviewed: Basic Metabolic Panel:  Recent Labs Lab 03/11/15 1445 03/12/15 0502  03/13/15 0330  NA 142 144 142  K 4.4 4.5 3.6  CL 107 105 101  CO2 26 32 34*  GLUCOSE 157* 88 93  BUN 21* 20 20  CREATININE 1.09 1.03 1.15  CALCIUM 9.2 9.1 9.0   Liver Function Tests:  Recent Labs Lab 03/11/15 1445  AST 25  ALT 16*  ALKPHOS 77  BILITOT 1.3*  PROT 6.3*  ALBUMIN 3.3*   No results for input(s): LIPASE, AMYLASE in the  last 168 hours. No results for input(s): AMMONIA in the last 168 hours. CBC:  Recent Labs Lab 03/11/15 1445 03/12/15 0502 03/13/15 0330  WBC 5.2 4.2 5.0  HGB 11.1* 9.9* 9.7*  HCT 35.9* 32.4* 32.6*  MCV 88.0 88.8 88.8  PLT 112* 108* 121*   Cardiac Enzymes: No results for input(s): CKTOTAL, CKMB, CKMBINDEX, TROPONINI in the last 168 hours. BNP: Invalid input(s): POCBNP CBG: No results for input(s): GLUCAP in the last 168 hours.  No results found for this or any previous visit (from the past 240 hour(s)).   Scheduled Meds: . finasteride  5 mg Oral Daily  . furosemide  40 mg Intravenous BID  . gatifloxacin  1 drop Right Eye QID  . lisinopril  20 mg Oral Daily  . metoprolol  50 mg Oral Daily  . metoprolol tartrate  25 mg Oral QHS  . nystatin   Topical TID  . simvastatin  20 mg Oral QHS  . sodium chloride  3 mL Intravenous Q12H  . terazosin  10 mg Oral QHS  . warfarin  7.5 mg Oral ONCE-1800  . Warfarin - Pharmacist Dosing Inpatient   Does not apply q1800   Continuous Infusions:    [place], DO  [facility] Pager [phone]  If 7PM-7AM, please contact night-coverage [email or web address] Password [email or web address] 03/13/2015, 11:44 AM   LOS: 2 days

## 2015-03-14 DIAGNOSIS — I4891 Unspecified atrial fibrillation: Secondary | ICD-10-CM

## 2015-03-14 DIAGNOSIS — I10 Essential (primary) hypertension: Secondary | ICD-10-CM

## 2015-03-14 DIAGNOSIS — I5031 Acute diastolic (congestive) heart failure: Secondary | ICD-10-CM

## 2015-03-14 LAB — BASIC METABOLIC PANEL
ANION GAP: 7 (ref 5–15)
BUN: 22 mg/dL — ABNORMAL HIGH (ref 6–20)
CHLORIDE: 99 mmol/L — AB (ref 101–111)
CO2: 33 mmol/L — AB (ref 22–32)
Calcium: 8.7 mg/dL — ABNORMAL LOW (ref 8.9–10.3)
Creatinine, Ser: 0.97 mg/dL (ref 0.61–1.24)
GFR calc non Af Amer: >60 mL/min (ref >60)
GLUCOSE: 93 mg/dL (ref 65–99)
Potassium: 3.5 mmol/L (ref 3.5–5.1)
Sodium: 139 mmol/L (ref 135–145)

## 2015-03-14 LAB — CBC
HCT: 33 % — ABNORMAL LOW (ref 39.0–52.0)
HEMOGLOBIN: 9.9 g/dL — AB (ref 13.0–17.0)
MCH: 26.5 pg (ref 26.0–34.0)
MCHC: 30 g/dL (ref 30.0–36.0)
MCV: 88.2 fL (ref 78.0–100.0)
Platelets: 119 10*3/uL — ABNORMAL LOW (ref 150–400)
RBC: 3.74 MIL/uL — AB (ref 4.22–5.81)
RDW: 17 % — ABNORMAL HIGH (ref 11.5–15.5)
WBC: 4.9 10*3/uL (ref 4.0–10.5)

## 2015-03-14 LAB — PROTIME-INR
INR: 2.1 — ABNORMAL HIGH (ref 0.00–1.49)
Prothrombin Time: 23.4 seconds — ABNORMAL HIGH (ref 11.6–15.2)

## 2015-03-14 MED ORDER — POTASSIUM CHLORIDE CRYS ER 20 MEQ PO TBCR
40.0000 meq | EXTENDED_RELEASE_TABLET | Freq: Once | ORAL | Status: AC
  Administered 2015-03-14: 40 meq via ORAL
  Filled 2015-03-14: qty 2

## 2015-03-14 MED ORDER — INFLUENZA VAC SPLIT QUAD 0.5 ML IM SUSY
0.5000 mL | PREFILLED_SYRINGE | INTRAMUSCULAR | Status: AC
  Administered 2015-03-15: 0.5 mL via INTRAMUSCULAR
  Filled 2015-03-14: qty 0.5

## 2015-03-14 MED ORDER — WARFARIN SODIUM 7.5 MG PO TABS
7.5000 mg | ORAL_TABLET | Freq: Once | ORAL | Status: AC
  Administered 2015-03-14: 7.5 mg via ORAL
  Filled 2015-03-14: qty 1

## 2015-03-14 MED ORDER — TRAZODONE HCL 50 MG PO TABS
25.0000 mg | ORAL_TABLET | Freq: Every day | ORAL | Status: DC
  Administered 2015-03-14: 25 mg via ORAL
  Filled 2015-03-14: qty 1

## 2015-03-14 NOTE — Care Management Important Message (Signed)
Important Message  Patient Details  Name: Chad Buchanan. MRN: [id number] Date of Birth: 02-Oct-1920   Medicare Important Message Given:  Yes-second notification given    Delorse Lek 03/14/2015, 12:15 PM

## 2015-03-14 NOTE — Progress Notes (Signed)
ANTICOAGULATION CONSULT NOTE - Follow Up Consult  Pharmacy Consult for warfarin Indication: atrial fibrillation  Allergies  Allergen Reactions  . Oxycodone Hcl     Made him sleep for 2 days  . Ultram [Tramadol Hcl]     oversedation    Patient Measurements: Height: 6\' 1"  (185.4 cm) Weight: 218 lb 3.2 oz (98.975 kg) (scale b) IBW/kg (Calculated) : 79.9  Vital Signs: Temp: 97.9 F (36.6 C) (10/20 0900) Temp Source: Oral (10/20 0900) BP: 86/46 mmHg (10/20 0900) Pulse Rate: 80 (10/20 0900)  Labs:  Recent Labs  03/12/15 0502 03/13/15 0330 03/14/15 0419  HGB 9.9* 9.7* 9.9*  HCT 32.4* 32.6* 33.0*  PLT 108* 121* 119*  LABPROT 32.8* 27.7* 23.4*  INR 3.29* 2.63* 2.10*  CREATININE 1.03 1.15 0.97    Estimated Creatinine Clearance: 60.1 mL/min (by C-G formula based on Cr of 0.97).  Assessment: 81 y/o male admitted for HF exacerbation who takes chronic warfarin for Afib. INR is therapeutic at 2.1 but trending down. Dose held on 10/17 and lower dose given on 10/18 so will give a little extra tonight. No bleeding noted, Hb is low stable, platelets low stable.   PTA: 5 mg daily EXCEPT 7.5 mg Wed/Sun  Goal of Therapy:  INR 2-3 Monitor platelets by anticoagulation protocol: Yes   Plan:  - Warfarin 7.5 mg PO tonight  - INR daily - Monitor for s/sx of bleeding  [facility], Pharm.D., BCPS Clinical Pharmacist Pager: [phone] 03/14/2015 10:30 AM

## 2015-03-14 NOTE — Clinical Social Work Note (Signed)
Clinical Social Work Assessment  Patient Details  Name: Chad Buchanan. MRN: [id number] Date of Birth: [birth year]  Date of referral:  03/14/15               Reason for consult:  Facility Placement                Permission sought to share information with:  Facility Sport and exercise psychologist, Family Supports Permission granted to share information::  Yes, Verbal Permission Granted  Name::     [place] SNF's, Wife, Sister.     Housing/Transportation Living arrangements for the past 2 months:  Single Family Home Source of Information:  Patient, Spouse, Other (Comment Required) (Sister) Patient Interpreter Needed:  None Criminal Activity/Legal Involvement Pertinent to Current Situation/Hospitalization:  No - Comment as needed Significant Relationships:  Siblings, Spouse Lives with:  Spouse Do you feel safe going back to the place where you live?    Need for family participation in patient care:  Yes (Comment)  Care giving concerns: Patient's wife feels that she cannot manage patient's care needs at home at this time. Patient was resistant at first to SNF for rehab but now reluctantly agrees to short term stay. He admits that he is a little weak.   Social Worker assessment / plan:  CSW met with patient and wife to discuss PT's recommendation for short term SNF.  Patient states that he is normally self sufficient of his ADLS's and denied that he would be interested in SNF. After a discussion with MD- he reluctantly agreed to a short term SNF for "maybe a week because everyone is insistent that I do this." Patient readily admits that his wife would be unable to assist him as she uses a rolling walker to ambulate.  Discussed bed search process and Fl2 completed for MD's signature. Active bed search in place as they hope for placement in [facility] as wife does not drive well.    Employment status:  Retired Nurse, adult PT Recommendations:  [place] / Referral to community resources:  [place]  Patient/Family's Response to care:  Patient's wife is very relieved that he now agrees to short term stay.  Patient is resigned to this plan but it not upset.  "I will do what I have to do."  Patient/Family's Understanding of and Emotional Response to Diagnosis, Current Treatment, and Prognosis:  Patient an wife appear to have a very good understanding of patient's current diagnosis, treatment needs and prognosis.  Both are hopeful for short term rehab and that he will become stronger.  Neither patient nor wife are upset or emotional about this decision to seek SNF.  Emotional Assessment Appearance:  Appears stated age Attitude/Demeanor/Rapport:  Apprehensive, Guarded Affect (typically observed):  Pleasant, Apprehensive, Guarded, Calm Orientation:  Oriented to Self, Oriented to Place, Oriented to  Time, Oriented to Situation Alcohol / Substance use:  Never Used Psych involvement (Current and /or in the community):  No (Comment)  Discharge Needs  Concerns to be addressed:  Care Coordination Readmission within the last 30 days:  No Current discharge risk:  Dependent with Mobility Barriers to Discharge:  Continued Medical Work up   Kendell Bane T, LCSW 03/14/2015, 2:35 PM

## 2015-03-14 NOTE — Progress Notes (Signed)
Physical Therapy Treatment Patient Details Name: Chad Buchanan. MRN: [id number] DOB: [birth year] Today's Date: 03/14/2015    History of Present Illness Pt is a 81 y/o M who presented for fluid overload and admitted w/ acute respiratory failure w/ hypoxia and afib.  Pt's PMH includes afib, MI, AAA, chronic Rt hip pain.    PT Comments    Pt progressing, demo's carryover during session with RW technique and improved safety awareness; amb with O2, 2-3/4 DOE-see note for O2 sat details; continue  to  recommend SNF post acute, pt is in agreement.  Follow Up Recommendations  SNF;Supervision for mobility/OOB     Equipment Recommendations  None recommended by PT    Recommendations for Other Services       Precautions / Restrictions Precautions Precautions: Fall Precaution Comments: Pt is  impulsive Restrictions Weight Bearing Restrictions: No    Mobility  Bed Mobility Overal bed mobility: Needs Assistance Bed Mobility: Supine to Sit     Supine to sit: Supervision     General bed mobility comments: bed flat, rails not used, incr time and supervision for safety  Transfers Overall transfer level: Needs assistance Equipment used: Rolling walker (2 wheeled) Transfers: Sit to/from Stand Sit to Stand: Min assist         General transfer comment: min assist for wt shift to standing, to stabilize in standing and for control of descent; multi-modal cues for hand placement and  to slow down/  Ambulation/Gait Ambulation/Gait assistance: Min assist;Mod assist Ambulation Distance (Feet): 95 Feet Assistive device: Rolling walker (2 wheeled) Gait Pattern/deviations: Step-through pattern;Decreased stride length;Trunk flexed;Drifts right/left     General Gait Details: pt requires incr, firm verbal cues to keep RW correct distance from self, incr trunk extension, as well as cues for safety with turns; pt is more responsive to verbal cues today, he is HOH and requires repetition of  cues; LOB x 1 with mod to recover; min assist to maneuver RW at times   Stairs            Wheelchair Mobility    Modified Rankin (Stroke Patients Only)       Balance   Sitting-balance support: Single extremity supported;Feet supported Sitting balance-[name]: Good     Standing balance support: Bilateral upper extremity supported;During functional activity Standing balance-[name]: Poor Standing balance comment: Instability noted standing EOB                     Cognition Arousal/Alertness: Awake/alert Behavior During Therapy: Impulsive Overall Cognitive Status: Within Functional Limits for tasks assessed                      Exercises General Exercises - Lower Extremity Ankle Circles/Pumps: AROM;Both;10 reps;Seated    General Comments General comments (skin integrity, edema, etc.): Pt on 2.5L O2, resting sats 94%, O2 removed at rest and sats decr to 87%; during amb O2 sats 89% on 3L, incr to >92% at rest & returned to 2.5L      Pertinent Vitals/Pain Pain Assessment: No/denies pain    Home Living                      Prior Function            PT Goals (current goals can now be found in the care plan section) Acute Rehab PT Goals Patient Stated Goal: to go home but may need to go to rehab first Time For Goal  Achievement: 04/02/15 Potential to Achieve Goals: Good Progress towards PT goals: Progressing toward goals    Frequency  Min 3X/week    PT Plan Current plan remains appropriate    Co-evaluation             End of Session Equipment Utilized During Treatment: Gait belt;Oxygen Activity Tolerance: Patient tolerated treatment well Patient left: in chair;with call bell/phone within reach;with chair alarm set     Time: 2025-4270 PT Time Calculation (min) (ACUTE ONLY): 26 min  Charges:  $Gait Training: 23-37 mins                    G Codes:      Chad Buchanan 2015/03/17, 10:08 AM

## 2015-03-14 NOTE — Progress Notes (Signed)
Patient and wife have chosen a bed at [facility] and facility has one semi-private room left.  Wife will go to facility to sign admit papers this afternoon.  CSW spoke with Dr. Maryland Pink who is hopeful that patient will be medically stable for d/c tomorrow.  CSW will contact [employer] in the morning re: need for SNF authorization.  Lorie Phenix. Pauline Good, [place]

## 2015-03-14 NOTE — Clinical Social Work Placement (Addendum)
   CLINICAL SOCIAL WORK PLACEMENT  NOTE  Date:  03/14/2015  Patient Details  Name: Chad Buchanan. MRN: [id number] Date of Birth: 20-Aug-1920  Clinical Social Work is seeking post-discharge placement for this patient at the [place] level of care (*CSW will initial, date and re-position this form in  chart as items are completed):  Yes   Patient/family provided with [facility] Work Department's list of facilities offering this level of care within the geographic area requested by the patient (or if unable, by the patient's family).  Yes   Patient/family informed of their freedom to choose among providers that offer the needed level of care, that participate in Medicare, Medicaid or managed care program needed by the patient, have an available bed and are willing to accept the patient.  Yes   Patient/family informed of [facility]'s ownership interest in [facility] and [facility], as well as of the fact that they are under no obligation to receive care at these facilities.  PASRR submitted to EDS on 03/14/15     PASRR number received on 03/14/15     Existing PASRR number confirmed on       FL2 transmitted to all facilities in geographic area requested by pt/family on 03/14/15     FL2 transmitted to all facilities within larger geographic area on       Patient informed that his/her managed care company has contracts with or will negotiate with certain facilities, including the following:   [facility] Medicare.  - [place])     Yes   Patient/family informed of bed offers received.  Patient chooses bed at [facility]     Physician recommends and patient chooses bed at      Patient to be transferred to [facility] on  .  Patient to be transferred to facility by Ambulance [facility])     Patient family notified on   of transfer.  Name of family member notified:  Wife:  Chad Buchanan     PHYSICIAN Please prepare  priority discharge summary, including medications, Please sign FL2, Please prepare prescriptions     Additional Comment:    _______________________________________________ Williemae Area, LCSW 03/14/2015, 3:15 PM

## 2015-03-14 NOTE — Progress Notes (Signed)
PROGRESS NOTE  Chad Buchanan. LTJ:[id number] DOB: Dec 16, 1920 DOA: 03/11/2015 PCP: Jerlyn Ly, MD  Brief History 81 year old male with a history of atrial fibrillation, hypertension, hyperlipidemia, coronary artery disease with history of MI, BPH presented from his primary care provider's office because of shortness of breath and greater than 15 pound weight gain. The patient was noted to be hypoxic in the office. As a result, the patient was sent to the emergency department. The patient was initiated on oral furosemide on 03/08/2015, but there was no significant improvement. He also developed significant scrotal edema. He was hospitalized for intravenous diuretics.  Assessment/Plan: Acute respiratory failure with hypoxia  -Secondary to CHF  -Wean oxygen for oxygen saturation greater than 92%  - Much improved.  Acute diastolic CHF -07/62/2633 weight 212 pounds -03/11/2015 weight 232 pounds -Daily weights--he is down 218 pounds today.  -Strict ins and outs. Negative by almost 6 L so far -Continue intravenous furosemide 40 mg twice a day for one more day. -03/12/2015 echo--EF 50-50 percent, no wall motion and mobility, severe LAE, moderate TR, mild MR, mild AS  History of Atrial fibrillation -Rate controlled -Continue metoprolol tartrate -Continue warfarin  -CHADS-VASc = 5  Essential Hypertension  -Continue lisinopril and metoprolol tartrate   BPH  -Continue Hytrin for now  -Continue finasteride   Hyperlipidemia  -Continue statin  Conjunctivitis  -Continue gatifloxacin ophthalmic   DVT prophylaxis: Patient is on warfarin CODE STATUS: Full code Family Communication:Discussed with the patient's wife and sister Disposition Plan: Hopefully to SNF tomorrow     Procedures/Studies: Dg Chest 2 View  03/11/2015  CLINICAL DATA:  Shortness of breath EXAM: CHEST  2 VIEW COMPARISON:  05/04/2011 FINDINGS: Coarse interstitial opacities diffusely, likely  progressed from prior but chronic. Interstitial lung disease seen at the bases seen on 2012 abdominal CT. There is a rounded opacity overlapping the elevated right diaphragm in the lateral projection. Chronic cardiomegaly and vascular pedicle widening. No overt edema or effusion. No air leak. IMPRESSION: 1. Basilar opacity overlapping the spine, possible pneumonia. 2. Interstitial lung disease with mild progression since 2012. Electronically Signed   By: Monte Fantasia M.D.   On: 03/11/2015 15:45        Subjective: Patient feels well. Complains that he couldn't sleep last night. Requesting medication to help with sleep. States that his breathing is better. Swelling in his scrotum is much improved. He would like to go home, but agreeable to go to a SNF for short-term rehabilitation. His wife and sister were at the bedside.  Objective: Filed Vitals:   03/13/15 1536 03/13/15 2030 03/14/15 0435 03/14/15 0900  BP:  133/51 120/63 86/46  Pulse: 55 68 82 80  Temp:  97.3 F (36.3 C) 97.3 F (36.3 C) 97.9 F (36.6 C)  TempSrc:  Oral Oral Oral  Resp:  18 18 18   Height:      Weight:   98.975 kg (218 lb 3.2 oz)   SpO2:  98% 98% 92%    Intake/Output Summary (Last 24 hours) at 03/14/15 1122 Last data filed at 03/14/15 1100  Gross per 24 hour  Intake    963 ml  Output   3192 ml  Net  -2229 ml   Filed Weights   03/12/15 0431 03/13/15 0541 03/14/15 0435  Weight: 103.964 kg (229 lb 3.2 oz) 101.334 kg (223 lb 6.4 oz) 98.975 kg (218 lb 3.2 oz)     Weight change: -2.359 kg (-5 lb  3.2 oz) Exam:   General:  Pt is alert, not in acute distress  Cardiovascular: S1, S2 is normal, regular. No S3, S4. No rubs, murmurs or bruit.   Respiratory: Crackles bilateral bases. No wheezing.  Abdomen: Soft/+BS, non tender, non distended, no guarding  Much improved Scrotal edema per patient and wife.  Extremities: 1-2+ edema bilateral lower extremities.  Data Reviewed: Basic Metabolic Panel:  Recent  Labs Lab 03/11/15 1445 03/12/15 0502 03/13/15 0330 03/14/15 0419  NA 142 144 142 139  K 4.4 4.5 3.6 3.5  CL 107 105 101 99*  CO2 26 32 34* 33*  GLUCOSE 157* 88 93 93  BUN 21* 20 20 22*  CREATININE 1.09 1.03 1.15 0.97  CALCIUM 9.2 9.1 9.0 8.7*   Liver Function Tests:  Recent Labs Lab 03/11/15 1445  AST 25  ALT 16*  ALKPHOS 77  BILITOT 1.3*  PROT 6.3*  ALBUMIN 3.3*   CBC:  Recent Labs Lab 03/11/15 1445 03/12/15 0502 03/13/15 0330 03/14/15 0419  WBC 5.2 4.2 5.0 4.9  HGB 11.1* 9.9* 9.7* 9.9*  HCT 35.9* 32.4* 32.6* 33.0*  MCV 88.0 88.8 88.8 88.2  PLT 112* 108* 121* 119*     Scheduled Meds: . finasteride  5 mg Oral Daily  . furosemide  40 mg Intravenous BID  . gatifloxacin  1 drop Right Eye QID  . lisinopril  20 mg Oral Daily  . metoprolol tartrate  12.5 mg Oral BID  . nystatin   Topical TID  . simvastatin  20 mg Oral QHS  . sodium chloride  3 mL Intravenous Q12H  . terazosin  10 mg Oral QHS  . warfarin  7.5 mg Oral ONCE-1800  . Warfarin - Pharmacist Dosing Inpatient   Does not apply q1800   Continuous Infusions:    Bonnielee Haff   [facility] Pager [phone]  If 7PM-7AM, please contact night-coverage [email or web address] Password [email or web address] 03/14/2015, 11:22 AM   LOS: 3 days

## 2015-03-15 DIAGNOSIS — N400 Enlarged prostate without lower urinary tract symptoms: Secondary | ICD-10-CM | POA: Diagnosis not present

## 2015-03-15 DIAGNOSIS — Z7901 Long term (current) use of anticoagulants: Secondary | ICD-10-CM | POA: Diagnosis not present

## 2015-03-15 DIAGNOSIS — H109 Unspecified conjunctivitis: Secondary | ICD-10-CM | POA: Diagnosis not present

## 2015-03-15 DIAGNOSIS — Z85828 Personal history of other malignant neoplasm of skin: Secondary | ICD-10-CM | POA: Diagnosis not present

## 2015-03-15 DIAGNOSIS — I480 Paroxysmal atrial fibrillation: Secondary | ICD-10-CM | POA: Diagnosis not present

## 2015-03-15 DIAGNOSIS — Z79899 Other long term (current) drug therapy: Secondary | ICD-10-CM | POA: Diagnosis not present

## 2015-03-15 DIAGNOSIS — I252 Old myocardial infarction: Secondary | ICD-10-CM | POA: Diagnosis not present

## 2015-03-15 DIAGNOSIS — I2510 Atherosclerotic heart disease of native coronary artery without angina pectoris: Secondary | ICD-10-CM | POA: Diagnosis not present

## 2015-03-15 DIAGNOSIS — I10 Essential (primary) hypertension: Secondary | ICD-10-CM | POA: Diagnosis not present

## 2015-03-15 DIAGNOSIS — R278 Other lack of coordination: Secondary | ICD-10-CM | POA: Diagnosis not present

## 2015-03-15 DIAGNOSIS — H1089 Other conjunctivitis: Secondary | ICD-10-CM | POA: Diagnosis not present

## 2015-03-15 DIAGNOSIS — I110 Hypertensive heart disease with heart failure: Secondary | ICD-10-CM | POA: Diagnosis not present

## 2015-03-15 DIAGNOSIS — D684 Acquired coagulation factor deficiency: Secondary | ICD-10-CM | POA: Diagnosis not present

## 2015-03-15 DIAGNOSIS — B372 Candidiasis of skin and nail: Secondary | ICD-10-CM | POA: Diagnosis not present

## 2015-03-15 DIAGNOSIS — M6281 Muscle weakness (generalized): Secondary | ICD-10-CM | POA: Diagnosis not present

## 2015-03-15 DIAGNOSIS — I4891 Unspecified atrial fibrillation: Secondary | ICD-10-CM | POA: Diagnosis not present

## 2015-03-15 DIAGNOSIS — R0602 Shortness of breath: Secondary | ICD-10-CM | POA: Diagnosis present

## 2015-03-15 DIAGNOSIS — I5031 Acute diastolic (congestive) heart failure: Secondary | ICD-10-CM | POA: Diagnosis not present

## 2015-03-15 DIAGNOSIS — N5089 Other specified disorders of the male genital organs: Secondary | ICD-10-CM | POA: Diagnosis not present

## 2015-03-15 DIAGNOSIS — J9601 Acute respiratory failure with hypoxia: Secondary | ICD-10-CM | POA: Diagnosis not present

## 2015-03-15 DIAGNOSIS — E785 Hyperlipidemia, unspecified: Secondary | ICD-10-CM | POA: Diagnosis not present

## 2015-03-15 DIAGNOSIS — R2689 Other abnormalities of gait and mobility: Secondary | ICD-10-CM | POA: Diagnosis not present

## 2015-03-15 DIAGNOSIS — I5033 Acute on chronic diastolic (congestive) heart failure: Secondary | ICD-10-CM | POA: Diagnosis not present

## 2015-03-15 LAB — CBC
HCT: 34.4 % — ABNORMAL LOW (ref 39.0–52.0)
Hemoglobin: 10.3 g/dL — ABNORMAL LOW (ref 13.0–17.0)
MCH: 26.3 pg (ref 26.0–34.0)
MCHC: 29.9 g/dL — ABNORMAL LOW (ref 30.0–36.0)
MCV: 87.8 fL (ref 78.0–100.0)
PLATELETS: 118 10*3/uL — AB (ref 150–400)
RBC: 3.92 MIL/uL — ABNORMAL LOW (ref 4.22–5.81)
RDW: 16.8 % — AB (ref 11.5–15.5)
WBC: 5.7 10*3/uL (ref 4.0–10.5)

## 2015-03-15 LAB — BASIC METABOLIC PANEL
Anion gap: 7 (ref 5–15)
BUN: 19 mg/dL (ref 6–20)
CALCIUM: 9 mg/dL (ref 8.9–10.3)
CHLORIDE: 100 mmol/L — AB (ref 101–111)
CO2: 34 mmol/L — AB (ref 22–32)
CREATININE: 0.97 mg/dL (ref 0.61–1.24)
GFR calc Af Amer: >60 mL/min (ref >60)
GFR calc non Af Amer: >60 mL/min (ref >60)
GLUCOSE: 91 mg/dL (ref 65–99)
Potassium: 3.9 mmol/L (ref 3.5–5.1)
Sodium: 141 mmol/L (ref 135–145)

## 2015-03-15 LAB — PROTIME-INR
INR: 2.09 — ABNORMAL HIGH (ref 0.00–1.49)
PROTHROMBIN TIME: 23.3 s — AB (ref 11.6–15.2)

## 2015-03-15 MED ORDER — FUROSEMIDE 40 MG PO TABS: 40.0000 mg | ORAL_TABLET | Freq: Two times a day (BID) | ORAL | Status: DC

## 2015-03-15 MED ORDER — ACETAMINOPHEN 325 MG PO TABS: 650.0000 mg | ORAL_TABLET | Freq: Four times a day (QID) | ORAL | Status: DC | PRN

## 2015-03-15 MED ORDER — METOPROLOL TARTRATE 25 MG PO TABS: 12.5000 mg | ORAL_TABLET | Freq: Two times a day (BID) | ORAL | Status: DC

## 2015-03-15 MED ORDER — WARFARIN SODIUM 7.5 MG PO TABS: 7.5000 mg | ORAL_TABLET | Freq: Once | ORAL | Status: DC

## 2015-03-15 MED ORDER — TRAZODONE HCL 50 MG PO TABS: 25.0000 mg | ORAL_TABLET | Freq: Every evening | ORAL | Status: DC | PRN

## 2015-03-15 MED ORDER — ALBUTEROL SULFATE (2.5 MG/3ML) 0.083% IN NEBU: 2.5000 mg | INHALATION_SOLUTION | Freq: Four times a day (QID) | RESPIRATORY_TRACT | Status: DC | PRN

## 2015-03-15 NOTE — Progress Notes (Signed)
ANTICOAGULATION CONSULT NOTE - Follow Up Consult  Pharmacy Consult for warfarin Indication: atrial fibrillation  Allergies  Allergen Reactions  . Oxycodone Hcl     Made him sleep for 2 days  . Ultram [Tramadol Hcl]     oversedation    Patient Measurements: Height: 6\' 1"  (185.4 cm) Weight: 210 lb 8 oz (95.482 kg) IBW/kg (Calculated) : 79.9  Vital Signs: Temp: 97.8 F (36.6 C) (10/21 0503) Temp Source: Oral (10/21 0503) BP: 146/84 mmHg (10/21 0503) Pulse Rate: 77 (10/21 0503)  Labs:  Recent Labs  03/13/15 0330 03/14/15 0419 03/15/15 0248  HGB 9.7* 9.9* 10.3*  HCT 32.6* 33.0* 34.4*  PLT 121* 119* 118*  LABPROT 27.7* 23.4* 23.3*  INR 2.63* 2.10* 2.09*  CREATININE 1.15 0.97 0.97    Estimated Creatinine Clearance: 54.9 mL/min (by C-G formula based on Cr of 0.97).  Assessment: 81 y/o male admitted for HF exacerbation who takes chronic warfarin for Afib. INR is therapeutic at 2.1 but trending down. Dose held on 10/17. INR therapeutic but just slightly low so will repeat with the higher dose today.   PTA: 5 mg daily EXCEPT 7.5 mg Wed/Sun  Goal of Therapy:  INR 2-3 Monitor platelets by anticoagulation protocol: Yes   Plan:   - Warfarin 7.5 mg PO tonight  - INR daily - Monitor for s/sx of bleeding  Onnie Boer, PharmD Pager: [phone] 03/15/2015 10:08 AM

## 2015-03-15 NOTE — Discharge Summary (Signed)
[facility]  Physician Discharge Summary   Patient ID: Chad Buchanan. MRN: [id number] DOB/AGE: 79-Feb-1922 79 y.o.  Admit date: 03/11/2015 Discharge date: 03/15/2015  PCP: Jerlyn Ly, MD  DISCHARGE DIAGNOSES:  Principal Problem:   Acute respiratory failure with hypoxia ([place]) Active Problems:   Atrial fibrillation (HCC)   Essential hypertension   Acute congestive heart failure (HCC)   Candidal dermatitis   Conjunctivitis   Acute diastolic CHF (congestive heart failure) (HCC)   RECOMMENDATIONS FOR OUTPATIENT FOLLOW UP: 1. PT/INR per SNF protocol. 2. CBC and basic metabolic panel in 1 week 3. Please monitor his weights closely. Call M.D. for weight gain of more than 2 pounds.  DISCHARGE CONDITION: fair  Diet recommendation: Heart healthy with not more than 1200 ML of fluids on a daily basis  Filed Weights   03/13/15 0541 03/14/15 0435 03/15/15 0503  Weight: 101.334 kg (223 lb 6.4 oz) 98.975 kg (218 lb 3.2 oz) 95.482 kg (210 lb 8 oz)    INITIAL HISTORY: 81 year old male with a history of atrial fibrillation, hypertension, hyperlipidemia, coronary artery disease with history of MI, BPH presented from his primary care provider's office because of shortness of breath and greater than 15 pound weight gain. The patient was noted to be hypoxic in the office. As a result, the patient was sent to the emergency department. The patient was initiated on oral furosemide on 03/08/2015, but there was no significant improvement. He also developed significant scrotal edema. He was hospitalized for intravenous diuretics.  Consultations:  None  Procedures: Echocardiogram Study Conclusions - Left ventricle: The cavity size was normal. There was moderateconcentric hypertrophy. Systolic function was normal. Theestimated ejection fraction was in the range of 50% to 55%. Wallmotion was normal; there were no regional wall motionabnormalities. Doppler parameters are consistent  with elevatedventricular end-diastolic filling pressure. - Aortic valve: Probably trileaflet; moderately thickened,moderately calcified leaflets. There was mild stenosis. There wasmild regurgitation. Mean gradient (S): 15 mm Hg. Peak gradient(S): 29 mm Hg. Valve area (VTI): 1.15 cm^2. Valve area (Vmax):1.06 cm^2. Valve area (Vmean): 1.08 cm^2. - Aortic root: The aortic root was normal in size. - Mitral valve: Calcified annulus. Mildly thickened leaflets .There was mild regurgitation. - Left atrium: The atrium was severely dilated. - Right ventricle: The cavity size was normal. Wall thickness wasnormal. Systolic function was normal. - Right atrium: The atrium was moderately dilated. - Tricuspid valve: There was moderate regurgitation. - Pulmonic valve: There was mild regurgitation. - Pulmonary arteries: Systolic pressure was moderately increased.PA peak pressure: 50 mm Hg (S). - Inferior vena cava: The vessel was dilated. The respirophasicdiameter changes were blunted (< 50%), consistent with elevatedcentral venous pressure. - Pericardium, extracardiac: There was no pericardial effusion.  HOSPITAL COURSE:   Acute respiratory failure with hypoxia  This was thought to be secondary to acute diastolic congestive heart failure. Patient was started on Lasix. He has significantly improved. Continue to titrate oxygen.  Acute diastolic CHF Patient has responded to intravenous diuretics. He has lost significant amount of weight. His scrotal edema is significantly improved. His lower extremity edema is also much better. His weight is down to 210 pounds from 232 pounds at the time of admission. He'll be discharged on twice a day dosing of Lasix. Echocardiogram as above.  History of Atrial fibrillation Heart rate is well controlled. Continue metoprolol. He is on warfarin for stroke prevention. INR is therapeutic.  Essential Hypertension  Continue lisinopril and metoprolol tartrate   BPH   Stable. Continue home medications.  Hyperlipidemia  Continue statin  Conjunctivitis  Continue eye ointment.  Overall, stable. Discussed with his daughters yesterday. Discussed with the patient and wife today. He is agreeable to go to SNF. Stable for discharge.    PERTINENT LABS:  The results of significant diagnostics from this hospitalization (including imaging, microbiology, ancillary and laboratory) are listed below for reference.     Labs: Basic Metabolic Panel:  Recent Labs Lab 03/11/15 1445 03/12/15 0502 03/13/15 0330 03/14/15 0419 03/15/15 0248  NA 142 144 142 139 141  K 4.4 4.5 3.6 3.5 3.9  CL 107 105 101 99* 100*  CO2 26 32 34* 33* 34*  GLUCOSE 157* 88 93 93 91  BUN 21* 20 20 22* 19  CREATININE 1.09 1.03 1.15 0.97 0.97  CALCIUM 9.2 9.1 9.0 8.7* 9.0   Liver Function Tests:  Recent Labs Lab 03/11/15 1445  AST 25  ALT 16*  ALKPHOS 77  BILITOT 1.3*  PROT 6.3*  ALBUMIN 3.3*   CBC:  Recent Labs Lab 03/11/15 1445 03/12/15 0502 03/13/15 0330 03/14/15 0419 03/15/15 0248  WBC 5.2 4.2 5.0 4.9 5.7  HGB 11.1* 9.9* 9.7* 9.9* 10.3*  HCT 35.9* 32.4* 32.6* 33.0* 34.4*  MCV 88.0 88.8 88.8 88.2 87.8  PLT 112* 108* 121* 119* 118*   BNP: BNP (last 3 results)  Recent Labs  03/11/15 1645  BNP 331.5*     IMAGING STUDIES Dg Chest 2 View  03/11/2015  CLINICAL DATA:  Shortness of breath EXAM: CHEST  2 VIEW COMPARISON:  05/04/2011 FINDINGS: Coarse interstitial opacities diffusely, likely progressed from prior but chronic. Interstitial lung disease seen at the bases seen on 2012 abdominal CT. There is a rounded opacity overlapping the elevated right diaphragm in the lateral projection. Chronic cardiomegaly and vascular pedicle widening. No overt edema or effusion. No air leak. IMPRESSION: 1. Basilar opacity overlapping the spine, possible pneumonia. 2. Interstitial lung disease with mild progression since 2012. Electronically Signed   By: Monte Fantasia  M.D.   On: 03/11/2015 15:45    DISCHARGE EXAMINATION: Filed Vitals:   03/14/15 1209 03/14/15 1300 03/14/15 2100 03/15/15 0503  BP: 137/87 153/84 111/63 146/84  Pulse: 98 75 69 77  Temp: 97.7 F (36.5 C) 98 F (36.7 C) 97.3 F (36.3 C) 97.8 F (36.6 C)  TempSrc: Oral Oral Oral Oral  Resp: 18 18 18 22   Height:      Weight:    95.482 kg (210 lb 8 oz)  SpO2: 98% 97% 97% 96%   General appearance: alert, cooperative, appears stated age and no distress Resp: Much improved entry bilaterally. Few crackles at the bases. No wheezing. Cardio: regular rate and rhythm, S1, S2 normal, no murmur, click, rub or gallop GI: soft, non-tender; bowel sounds normal; no masses,  no organomegaly Extremities: Improved edema in the lower extremities. Improved scrotal edema.  DISPOSITION: SNF  Discharge Instructions    Call MD for:  difficulty breathing, headache or visual disturbances    Complete by:  As directed      Call MD for:  extreme fatigue    Complete by:  As directed      Call MD for:  persistant dizziness or light-headedness    Complete by:  As directed      Call MD for:  persistant nausea and vomiting    Complete by:  As directed      Call MD for:  severe uncontrolled pain    Complete by:  As directed  Call MD for:  temperature >100.4    Complete by:  As directed      Diet - low sodium heart healthy    Complete by:  As directed      Discharge instructions    Complete by:  As directed   Continue warfarin at her SNF protocol for INR between 2 and 3. Check Basic metabolic panel and CBC next week.  You were cared for by a hospitalist during your hospital stay. If you have any questions about your discharge medications or the care you received while you were in the hospital after you are discharged, you can call the unit and asked to speak with the hospitalist on call if the hospitalist that took care of you is not available. Once you are discharged, your primary care physician will  handle any further medical issues. Please note that NO REFILLS for any discharge medications will be authorized once you are discharged, as it is imperative that you return to your primary care physician (or establish a relationship with a primary care physician if you do not have one) for your aftercare needs so that they can reassess your need for medications and monitor your lab values. If you do not have a primary care physician, you can call [phone] for a physician referral.     Increase activity slowly    Complete by:  As directed            ALLERGIES:  Allergies  Allergen Reactions  . Oxycodone Hcl     Made him sleep for 2 days  . Ultram [Tramadol Hcl]     oversedation     Current Discharge Medication List    START taking these medications   Details  acetaminophen (TYLENOL) 325 MG tablet Take 2 tablets (650 mg total) by mouth every 6 (six) hours as needed for mild pain or headache.    albuterol (PROVENTIL) (2.5 MG/3ML) 0.083% nebulizer solution Take 3 mLs (2.5 mg total) by nebulization every 6 (six) hours as needed for wheezing or shortness of breath. Qty: 75 mL, Refills: 12    furosemide (LASIX) 40 MG tablet Take 1 tablet (40 mg total) by mouth 2 (two) times daily. Qty: 30 tablet    traZODone (DESYREL) 50 MG tablet Take 0.5 tablets (25 mg total) by mouth at bedtime as needed for sleep.      CONTINUE these medications which have CHANGED   Details  metoprolol (LOPRESSOR) 25 MG tablet Take 0.5 tablets (12.5 mg total) by mouth 2 (two) times daily. Take 1 table ( 50mg  ) every morning and 1/2 tablet at night ( 25mg )      CONTINUE these medications which have NOT CHANGED   Details  finasteride (PROSCAR) 5 MG tablet Take 5 mg by mouth daily.     lisinopril (PRINIVIL,ZESTRIL) 20 MG tablet Take 20 mg by mouth daily.      neomycin-polymyxin b-dexamethasone (MAXITROL) 3.5-10000-0.1 OINT APPLY 1 APPLICATION TO OU D Refills: 4    simvastatin (ZOCOR) 40 MG tablet Take 20 mg  by mouth at bedtime.     terazosin (HYTRIN) 10 MG capsule Take 10 mg by mouth at bedtime.     warfarin (COUMADIN) 5 MG tablet Take 5-7.5 mg by mouth See admin instructions. Pt takes 5mg  every day except on Sunday and Wednesday patient takes 7.5 mg       Follow-up Information    Follow up with Crist Infante A, MD. Schedule an appointment as soon as possible for a visit  in 1 week.   Specialty:  Internal Medicine   Why:  post hospitalization follow up   Contact information:   [place] [address] [phone]       [place]: 94 minutes  [facility] Pager [phone]  03/15/2015, 10:59 AM

## 2015-03-15 NOTE — Discharge Instructions (Signed)

## 2015-03-15 NOTE — Progress Notes (Signed)
Report called to Kathlynn Grate, LPM receiving nurse at [facility]. Questions answered. Copy of AVS given to family member at the bedside. Awaiting [facility] to transfer patient out.

## 2015-03-18 DIAGNOSIS — I4891 Unspecified atrial fibrillation: Secondary | ICD-10-CM | POA: Diagnosis not present

## 2015-03-18 DIAGNOSIS — I5031 Acute diastolic (congestive) heart failure: Secondary | ICD-10-CM | POA: Diagnosis not present

## 2015-03-18 DIAGNOSIS — E785 Hyperlipidemia, unspecified: Secondary | ICD-10-CM | POA: Diagnosis not present

## 2015-03-18 DIAGNOSIS — I10 Essential (primary) hypertension: Secondary | ICD-10-CM | POA: Diagnosis not present

## 2015-03-19 DIAGNOSIS — I5031 Acute diastolic (congestive) heart failure: Secondary | ICD-10-CM | POA: Diagnosis not present

## 2015-03-19 DIAGNOSIS — I10 Essential (primary) hypertension: Secondary | ICD-10-CM | POA: Diagnosis not present

## 2015-03-19 DIAGNOSIS — I4891 Unspecified atrial fibrillation: Secondary | ICD-10-CM | POA: Diagnosis not present

## 2015-03-21 DIAGNOSIS — I5031 Acute diastolic (congestive) heart failure: Secondary | ICD-10-CM | POA: Diagnosis not present

## 2015-03-21 DIAGNOSIS — I4891 Unspecified atrial fibrillation: Secondary | ICD-10-CM | POA: Diagnosis not present

## 2015-03-24 NOTE — Clinical Social Work Placement (Signed)
   CLINICAL SOCIAL WORK PLACEMENT  NOTE  Date:  03/15/2015  Patient Details  Name: Chad Buchanan. MRN: [id number] Date of Birth: 03/02/1921  Clinical Social Work is seeking post-discharge placement for this patient at the [place] level of care (*CSW will initial, date and re-position this form in  chart as items are completed):  Yes   Patient/family provided with [place] Work Department's list of facilities offering this level of care within the geographic area requested by the patient (or if unable, by the patient's family).  Yes   Patient/family informed of their freedom to choose among providers that offer the needed level of care, that participate in Medicare, Medicaid or managed care program needed by the patient, have an available bed and are willing to accept the patient.  Yes   Patient/family informed of [facility]'s ownership interest in [facility] and [facility], as well as of the fact that they are under no obligation to receive care at these facilities.  PASRR submitted to EDS on 03/14/15     PASRR number received on 03/14/15     Existing PASRR number confirmed on       FL2 transmitted to all facilities in geographic area requested by pt/family on 03/14/15     FL2 transmitted to all facilities within larger geographic area on       Patient informed that his/her managed care company has contracts with or will negotiate with certain facilities, including the following:   [facility] Medicare.  - [place])     Yes   Patient/family informed of bed offers received.  Patient chooses bed at [facility]     Physician recommends and patient chooses bed at      Patient to be transferred to [facility] on 03/15/15.  Patient to be transferred to facility by [facility])     Patient family notified on 03/15/15 of transfer.  Name of family member notified:  Wife:  Suvan Stcyr      PHYSICIAN Please prepare priority discharge summary, including medications, Please sign FL2, Please prepare prescriptions     Additional Comment: Patient d/c'd to SNF for short term SNF. Patient and wife are in agreement to d/c plan and deny any concerns or questions.  [employer] [place] auth in place.  EMS notified and d/c summary sent to facility; packet prepared.  Nursing notified to call report.  No further CSW needs identified. CSW signing off .   _______________________________________________ Williemae Area, LCSW 03/15/2015  1:35 PM

## 2015-03-31 DIAGNOSIS — M549 Dorsalgia, unspecified: Secondary | ICD-10-CM | POA: Diagnosis not present

## 2015-03-31 DIAGNOSIS — I480 Paroxysmal atrial fibrillation: Secondary | ICD-10-CM | POA: Diagnosis not present

## 2015-03-31 DIAGNOSIS — I5031 Acute diastolic (congestive) heart failure: Secondary | ICD-10-CM | POA: Diagnosis not present

## 2015-03-31 DIAGNOSIS — I2510 Atherosclerotic heart disease of native coronary artery without angina pectoris: Secondary | ICD-10-CM | POA: Diagnosis not present

## 2015-03-31 DIAGNOSIS — I110 Hypertensive heart disease with heart failure: Secondary | ICD-10-CM | POA: Diagnosis not present

## 2015-04-01 DIAGNOSIS — M549 Dorsalgia, unspecified: Secondary | ICD-10-CM | POA: Diagnosis not present

## 2015-04-01 DIAGNOSIS — I2510 Atherosclerotic heart disease of native coronary artery without angina pectoris: Secondary | ICD-10-CM | POA: Diagnosis not present

## 2015-04-01 DIAGNOSIS — I110 Hypertensive heart disease with heart failure: Secondary | ICD-10-CM | POA: Diagnosis not present

## 2015-04-01 DIAGNOSIS — I5031 Acute diastolic (congestive) heart failure: Secondary | ICD-10-CM | POA: Diagnosis not present

## 2015-04-01 DIAGNOSIS — I480 Paroxysmal atrial fibrillation: Secondary | ICD-10-CM | POA: Diagnosis not present

## 2015-04-03 DIAGNOSIS — I110 Hypertensive heart disease with heart failure: Secondary | ICD-10-CM | POA: Diagnosis not present

## 2015-04-03 DIAGNOSIS — I5031 Acute diastolic (congestive) heart failure: Secondary | ICD-10-CM | POA: Diagnosis not present

## 2015-04-03 DIAGNOSIS — I480 Paroxysmal atrial fibrillation: Secondary | ICD-10-CM | POA: Diagnosis not present

## 2015-04-03 DIAGNOSIS — I2510 Atherosclerotic heart disease of native coronary artery without angina pectoris: Secondary | ICD-10-CM | POA: Diagnosis not present

## 2015-04-03 DIAGNOSIS — M549 Dorsalgia, unspecified: Secondary | ICD-10-CM | POA: Diagnosis not present

## 2015-04-04 ENCOUNTER — Ambulatory Visit (INDEPENDENT_AMBULATORY_CARE_PROVIDER_SITE_OTHER): Payer: Commercial Managed Care - HMO | Admitting: Podiatry

## 2015-04-04 ENCOUNTER — Encounter: Payer: Self-pay | Admitting: Podiatry

## 2015-04-04 DIAGNOSIS — M79676 Pain in unspecified toe(s): Secondary | ICD-10-CM

## 2015-04-04 DIAGNOSIS — B351 Tinea unguium: Secondary | ICD-10-CM

## 2015-04-04 NOTE — Progress Notes (Signed)
Patient ID: Chad Buchanan., male   DOB: [birth year], 81 y.o.   MRN: [id number] Complaint:  Visit Type: Patient returns to my office for continued preventative foot care services. Complaint: Patient states" my nails have grown long and thick and become painful to walk and wear shoes.,. The patient presents for preventative foot care services. No changes to ROS  Podiatric Exam: Vascular: dorsalis pedis and posterior tibial pulses are not  palpable bilateral due to severe foot swelling.. Capillary return is immediate. Temperature gradient is WNL. Skin turgor WNL  Sensorium: Normal Semmes Weinstein monofilament test. Normal tactile sensation bilaterally. Nail Exam: Pt has thick disfigured discolored nails with subungual debris noted bilateral entire nail hallux through fifth toenails Ulcer Exam: There is no evidence of ulcer or pre-ulcerative changes or infection. Orthopedic Exam: Muscle tone and strength are WNL. No limitations in general ROM. No crepitus or effusions noted. Foot type and digits show no abnormalities. Bony prominences are unremarkable. Skin: No Porokeratosis. No infection or ulcers  Diagnosis:  Onychomycosis, , Pain in right toe, pain in left toes  Treatment & Plan Procedures and Treatment: Consent by patient was obtained for treatment procedures. The patient understood the discussion of treatment and procedures well. All questions were answered thoroughly reviewed. Debridement of mycotic and hypertrophic toenails, 1 through 5 bilateral and clearing of subungual debris. No ulceration, no infection noted.  Return Visit-Office Procedure: Patient instructed to return to the office for a follow up visit 3 months for continued evaluation and treatment.   Gardiner Barefoot DPM

## 2015-04-05 DIAGNOSIS — I480 Paroxysmal atrial fibrillation: Secondary | ICD-10-CM | POA: Diagnosis not present

## 2015-04-05 DIAGNOSIS — I2510 Atherosclerotic heart disease of native coronary artery without angina pectoris: Secondary | ICD-10-CM | POA: Diagnosis not present

## 2015-04-05 DIAGNOSIS — M549 Dorsalgia, unspecified: Secondary | ICD-10-CM | POA: Diagnosis not present

## 2015-04-05 DIAGNOSIS — I5031 Acute diastolic (congestive) heart failure: Secondary | ICD-10-CM | POA: Diagnosis not present

## 2015-04-05 DIAGNOSIS — I110 Hypertensive heart disease with heart failure: Secondary | ICD-10-CM | POA: Diagnosis not present

## 2015-04-08 DIAGNOSIS — I5031 Acute diastolic (congestive) heart failure: Secondary | ICD-10-CM | POA: Diagnosis not present

## 2015-04-08 DIAGNOSIS — M549 Dorsalgia, unspecified: Secondary | ICD-10-CM | POA: Diagnosis not present

## 2015-04-08 DIAGNOSIS — I2510 Atherosclerotic heart disease of native coronary artery without angina pectoris: Secondary | ICD-10-CM | POA: Diagnosis not present

## 2015-04-08 DIAGNOSIS — Z7901 Long term (current) use of anticoagulants: Secondary | ICD-10-CM | POA: Diagnosis not present

## 2015-04-08 DIAGNOSIS — I110 Hypertensive heart disease with heart failure: Secondary | ICD-10-CM | POA: Diagnosis not present

## 2015-04-08 DIAGNOSIS — I480 Paroxysmal atrial fibrillation: Secondary | ICD-10-CM | POA: Diagnosis not present

## 2015-04-09 DIAGNOSIS — I110 Hypertensive heart disease with heart failure: Secondary | ICD-10-CM | POA: Diagnosis not present

## 2015-04-09 DIAGNOSIS — M549 Dorsalgia, unspecified: Secondary | ICD-10-CM | POA: Diagnosis not present

## 2015-04-09 DIAGNOSIS — I480 Paroxysmal atrial fibrillation: Secondary | ICD-10-CM | POA: Diagnosis not present

## 2015-04-09 DIAGNOSIS — I5031 Acute diastolic (congestive) heart failure: Secondary | ICD-10-CM | POA: Diagnosis not present

## 2015-04-09 DIAGNOSIS — I2510 Atherosclerotic heart disease of native coronary artery without angina pectoris: Secondary | ICD-10-CM | POA: Diagnosis not present

## 2015-04-11 DIAGNOSIS — I2510 Atherosclerotic heart disease of native coronary artery without angina pectoris: Secondary | ICD-10-CM | POA: Diagnosis not present

## 2015-04-11 DIAGNOSIS — M549 Dorsalgia, unspecified: Secondary | ICD-10-CM | POA: Diagnosis not present

## 2015-04-11 DIAGNOSIS — I110 Hypertensive heart disease with heart failure: Secondary | ICD-10-CM | POA: Diagnosis not present

## 2015-04-11 DIAGNOSIS — I5031 Acute diastolic (congestive) heart failure: Secondary | ICD-10-CM | POA: Diagnosis not present

## 2015-04-11 DIAGNOSIS — I480 Paroxysmal atrial fibrillation: Secondary | ICD-10-CM | POA: Diagnosis not present

## 2015-04-13 DIAGNOSIS — Z6827 Body mass index (BMI) 27.0-27.9, adult: Secondary | ICD-10-CM | POA: Diagnosis not present

## 2015-04-13 DIAGNOSIS — Z7901 Long term (current) use of anticoagulants: Secondary | ICD-10-CM | POA: Diagnosis not present

## 2015-04-13 DIAGNOSIS — I10 Essential (primary) hypertension: Secondary | ICD-10-CM | POA: Diagnosis not present

## 2015-04-13 DIAGNOSIS — I509 Heart failure, unspecified: Secondary | ICD-10-CM | POA: Diagnosis not present

## 2015-04-13 DIAGNOSIS — I480 Paroxysmal atrial fibrillation: Secondary | ICD-10-CM | POA: Diagnosis not present

## 2015-04-16 DIAGNOSIS — I2510 Atherosclerotic heart disease of native coronary artery without angina pectoris: Secondary | ICD-10-CM | POA: Diagnosis not present

## 2015-04-16 DIAGNOSIS — I5031 Acute diastolic (congestive) heart failure: Secondary | ICD-10-CM | POA: Diagnosis not present

## 2015-04-16 DIAGNOSIS — M549 Dorsalgia, unspecified: Secondary | ICD-10-CM | POA: Diagnosis not present

## 2015-04-16 DIAGNOSIS — I110 Hypertensive heart disease with heart failure: Secondary | ICD-10-CM | POA: Diagnosis not present

## 2015-04-16 DIAGNOSIS — I480 Paroxysmal atrial fibrillation: Secondary | ICD-10-CM | POA: Diagnosis not present

## 2015-04-22 DIAGNOSIS — I480 Paroxysmal atrial fibrillation: Secondary | ICD-10-CM | POA: Diagnosis not present

## 2015-04-22 DIAGNOSIS — M549 Dorsalgia, unspecified: Secondary | ICD-10-CM | POA: Diagnosis not present

## 2015-04-22 DIAGNOSIS — I5031 Acute diastolic (congestive) heart failure: Secondary | ICD-10-CM | POA: Diagnosis not present

## 2015-04-22 DIAGNOSIS — I2510 Atherosclerotic heart disease of native coronary artery without angina pectoris: Secondary | ICD-10-CM | POA: Diagnosis not present

## 2015-04-22 DIAGNOSIS — I110 Hypertensive heart disease with heart failure: Secondary | ICD-10-CM | POA: Diagnosis not present

## 2015-04-24 DIAGNOSIS — I5031 Acute diastolic (congestive) heart failure: Secondary | ICD-10-CM | POA: Diagnosis not present

## 2015-04-24 DIAGNOSIS — I509 Heart failure, unspecified: Secondary | ICD-10-CM | POA: Diagnosis not present

## 2015-04-24 DIAGNOSIS — I110 Hypertensive heart disease with heart failure: Secondary | ICD-10-CM | POA: Diagnosis not present

## 2015-04-24 DIAGNOSIS — I480 Paroxysmal atrial fibrillation: Secondary | ICD-10-CM | POA: Diagnosis not present

## 2015-04-24 DIAGNOSIS — M549 Dorsalgia, unspecified: Secondary | ICD-10-CM | POA: Diagnosis not present

## 2015-04-24 DIAGNOSIS — Z7901 Long term (current) use of anticoagulants: Secondary | ICD-10-CM | POA: Diagnosis not present

## 2015-04-24 DIAGNOSIS — I2510 Atherosclerotic heart disease of native coronary artery without angina pectoris: Secondary | ICD-10-CM | POA: Diagnosis not present

## 2015-04-25 DIAGNOSIS — M549 Dorsalgia, unspecified: Secondary | ICD-10-CM | POA: Diagnosis not present

## 2015-04-25 DIAGNOSIS — I480 Paroxysmal atrial fibrillation: Secondary | ICD-10-CM | POA: Diagnosis not present

## 2015-04-25 DIAGNOSIS — I2510 Atherosclerotic heart disease of native coronary artery without angina pectoris: Secondary | ICD-10-CM | POA: Diagnosis not present

## 2015-04-25 DIAGNOSIS — I110 Hypertensive heart disease with heart failure: Secondary | ICD-10-CM | POA: Diagnosis not present

## 2015-04-25 DIAGNOSIS — I5031 Acute diastolic (congestive) heart failure: Secondary | ICD-10-CM | POA: Diagnosis not present

## 2015-05-01 DIAGNOSIS — I110 Hypertensive heart disease with heart failure: Secondary | ICD-10-CM | POA: Diagnosis not present

## 2015-05-01 DIAGNOSIS — I5031 Acute diastolic (congestive) heart failure: Secondary | ICD-10-CM | POA: Diagnosis not present

## 2015-05-01 DIAGNOSIS — M549 Dorsalgia, unspecified: Secondary | ICD-10-CM | POA: Diagnosis not present

## 2015-05-01 DIAGNOSIS — I2510 Atherosclerotic heart disease of native coronary artery without angina pectoris: Secondary | ICD-10-CM | POA: Diagnosis not present

## 2015-05-01 DIAGNOSIS — I480 Paroxysmal atrial fibrillation: Secondary | ICD-10-CM | POA: Diagnosis not present

## 2015-05-14 DIAGNOSIS — Z7901 Long term (current) use of anticoagulants: Secondary | ICD-10-CM | POA: Diagnosis not present

## 2015-05-14 DIAGNOSIS — I480 Paroxysmal atrial fibrillation: Secondary | ICD-10-CM | POA: Diagnosis not present

## 2015-06-12 DIAGNOSIS — Z7901 Long term (current) use of anticoagulants: Secondary | ICD-10-CM | POA: Diagnosis not present

## 2015-06-12 DIAGNOSIS — I480 Paroxysmal atrial fibrillation: Secondary | ICD-10-CM | POA: Diagnosis not present

## 2015-07-04 ENCOUNTER — Ambulatory Visit (INDEPENDENT_AMBULATORY_CARE_PROVIDER_SITE_OTHER): Payer: Commercial Managed Care - HMO | Admitting: Podiatry

## 2015-07-04 ENCOUNTER — Encounter: Payer: Self-pay | Admitting: Podiatry

## 2015-07-04 DIAGNOSIS — M79676 Pain in unspecified toe(s): Secondary | ICD-10-CM

## 2015-07-04 DIAGNOSIS — B351 Tinea unguium: Secondary | ICD-10-CM

## 2015-07-04 NOTE — Progress Notes (Signed)
Patient ID: Chad Bombara., male   DOB: 06-Mar-1921, 82 y.o.   MRN: [id number] Complaint:  Visit Type: Patient returns to my office for continued preventative foot care services. Complaint: Patient states" my nails have grown long and thick and become painful to walk and wear shoes.,. The patient presents for preventative foot care services. No changes to ROS  Podiatric Exam: Vascular: dorsalis pedis and posterior tibial pulses are not  palpable bilateral due to severe foot swelling.. Capillary return is immediate. Temperature gradient is WNL. Skin turgor WNL  Sensorium: Normal Semmes Weinstein monofilament test. Normal tactile sensation bilaterally. Nail Exam: Pt has thick disfigured discolored nails with subungual debris noted bilateral entire nail hallux through fifth toenails Ulcer Exam: There is no evidence of ulcer or pre-ulcerative changes or infection. Orthopedic Exam: Muscle tone and strength are WNL. No limitations in general ROM. No crepitus or effusions noted. Foot type and digits show no abnormalities. Bony prominences are unremarkable. Skin: No Porokeratosis. No infection or ulcers  Diagnosis:  Onychomycosis, , Pain in right toe, pain in left toes  Treatment & Plan Procedures and Treatment: Consent by patient was obtained for treatment procedures. The patient understood the discussion of treatment and procedures well. All questions were answered thoroughly reviewed. Debridement of mycotic and hypertrophic toenails, 1 through 5 bilateral and clearing of subungual debris. No ulceration, no infection noted.  Return Visit-Office Procedure: Patient instructed to return to the office for a follow up visit 3 months for continued evaluation and treatment.   Gardiner Barefoot DPM

## 2015-07-12 DIAGNOSIS — Z7901 Long term (current) use of anticoagulants: Secondary | ICD-10-CM | POA: Diagnosis not present

## 2015-07-12 DIAGNOSIS — I480 Paroxysmal atrial fibrillation: Secondary | ICD-10-CM | POA: Diagnosis not present

## 2015-07-31 DIAGNOSIS — R7301 Impaired fasting glucose: Secondary | ICD-10-CM | POA: Diagnosis not present

## 2015-07-31 DIAGNOSIS — Z125 Encounter for screening for malignant neoplasm of prostate: Secondary | ICD-10-CM | POA: Diagnosis not present

## 2015-07-31 DIAGNOSIS — I10 Essential (primary) hypertension: Secondary | ICD-10-CM | POA: Diagnosis not present

## 2015-07-31 DIAGNOSIS — E784 Other hyperlipidemia: Secondary | ICD-10-CM | POA: Diagnosis not present

## 2015-08-06 DIAGNOSIS — Z0000 Encounter for general adult medical examination without abnormal findings: Secondary | ICD-10-CM | POA: Diagnosis not present

## 2015-08-06 DIAGNOSIS — R7301 Impaired fasting glucose: Secondary | ICD-10-CM | POA: Diagnosis not present

## 2015-08-06 DIAGNOSIS — B351 Tinea unguium: Secondary | ICD-10-CM | POA: Diagnosis not present

## 2015-08-06 DIAGNOSIS — I509 Heart failure, unspecified: Secondary | ICD-10-CM | POA: Diagnosis not present

## 2015-08-06 DIAGNOSIS — I714 Abdominal aortic aneurysm, without rupture: Secondary | ICD-10-CM | POA: Diagnosis not present

## 2015-08-06 DIAGNOSIS — I2510 Atherosclerotic heart disease of native coronary artery without angina pectoris: Secondary | ICD-10-CM | POA: Diagnosis not present

## 2015-08-06 DIAGNOSIS — I10 Essential (primary) hypertension: Secondary | ICD-10-CM | POA: Diagnosis not present

## 2015-08-06 DIAGNOSIS — I480 Paroxysmal atrial fibrillation: Secondary | ICD-10-CM | POA: Diagnosis not present

## 2015-08-06 DIAGNOSIS — G459 Transient cerebral ischemic attack, unspecified: Secondary | ICD-10-CM | POA: Diagnosis not present

## 2015-09-09 DIAGNOSIS — Z7901 Long term (current) use of anticoagulants: Secondary | ICD-10-CM | POA: Diagnosis not present

## 2015-09-09 DIAGNOSIS — I480 Paroxysmal atrial fibrillation: Secondary | ICD-10-CM | POA: Diagnosis not present

## 2015-10-02 ENCOUNTER — Ambulatory Visit: Payer: Commercial Managed Care - HMO | Admitting: Podiatry

## 2015-10-08 DIAGNOSIS — Z7901 Long term (current) use of anticoagulants: Secondary | ICD-10-CM | POA: Diagnosis not present

## 2015-10-08 DIAGNOSIS — I480 Paroxysmal atrial fibrillation: Secondary | ICD-10-CM | POA: Diagnosis not present

## 2015-11-05 DIAGNOSIS — I480 Paroxysmal atrial fibrillation: Secondary | ICD-10-CM | POA: Diagnosis not present

## 2015-11-05 DIAGNOSIS — Z7901 Long term (current) use of anticoagulants: Secondary | ICD-10-CM | POA: Diagnosis not present

## 2015-12-09 DIAGNOSIS — I480 Paroxysmal atrial fibrillation: Secondary | ICD-10-CM | POA: Diagnosis not present

## 2015-12-09 DIAGNOSIS — Z7901 Long term (current) use of anticoagulants: Secondary | ICD-10-CM | POA: Diagnosis not present

## 2015-12-24 DIAGNOSIS — Z6830 Body mass index (BMI) 30.0-30.9, adult: Secondary | ICD-10-CM | POA: Diagnosis not present

## 2015-12-24 DIAGNOSIS — I480 Paroxysmal atrial fibrillation: Secondary | ICD-10-CM | POA: Diagnosis not present

## 2015-12-24 DIAGNOSIS — R7301 Impaired fasting glucose: Secondary | ICD-10-CM | POA: Diagnosis not present

## 2015-12-24 DIAGNOSIS — I10 Essential (primary) hypertension: Secondary | ICD-10-CM | POA: Diagnosis not present

## 2016-01-09 DIAGNOSIS — H35373 Puckering of macula, bilateral: Secondary | ICD-10-CM | POA: Diagnosis not present

## 2016-01-09 DIAGNOSIS — H35033 Hypertensive retinopathy, bilateral: Secondary | ICD-10-CM | POA: Diagnosis not present

## 2016-01-09 DIAGNOSIS — Z961 Presence of intraocular lens: Secondary | ICD-10-CM | POA: Diagnosis not present

## 2016-01-09 DIAGNOSIS — H02132 Senile ectropion of right lower eyelid: Secondary | ICD-10-CM | POA: Diagnosis not present

## 2016-01-21 DIAGNOSIS — I480 Paroxysmal atrial fibrillation: Secondary | ICD-10-CM | POA: Diagnosis not present

## 2016-01-21 DIAGNOSIS — Z7901 Long term (current) use of anticoagulants: Secondary | ICD-10-CM | POA: Diagnosis not present

## 2016-02-12 DIAGNOSIS — Z7901 Long term (current) use of anticoagulants: Secondary | ICD-10-CM | POA: Diagnosis not present

## 2016-02-12 DIAGNOSIS — Z23 Encounter for immunization: Secondary | ICD-10-CM | POA: Diagnosis not present

## 2016-02-12 DIAGNOSIS — I480 Paroxysmal atrial fibrillation: Secondary | ICD-10-CM | POA: Diagnosis not present

## 2016-03-13 DIAGNOSIS — Z7901 Long term (current) use of anticoagulants: Secondary | ICD-10-CM | POA: Diagnosis not present

## 2016-03-13 DIAGNOSIS — I480 Paroxysmal atrial fibrillation: Secondary | ICD-10-CM | POA: Diagnosis not present

## 2016-04-01 ENCOUNTER — Encounter: Payer: Self-pay | Admitting: Family

## 2016-04-03 ENCOUNTER — Ambulatory Visit (INDEPENDENT_AMBULATORY_CARE_PROVIDER_SITE_OTHER): Payer: Commercial Managed Care - HMO | Admitting: Family

## 2016-04-03 ENCOUNTER — Encounter: Payer: Self-pay | Admitting: Family

## 2016-04-03 ENCOUNTER — Ambulatory Visit (HOSPITAL_COMMUNITY)
Admission: RE | Admit: 2016-04-03 | Discharge: 2016-04-03 | Disposition: A | Discharge status: Home / Self Care | Payer: Commercial Managed Care - HMO | Source: Ambulatory Visit | Attending: Vascular Surgery | Admitting: Vascular Surgery

## 2016-04-03 VITALS — BP 160/98 | HR 89 | Temp 97.2°F | Ht 73.0 in | Wt 200.0 lb

## 2016-04-03 DIAGNOSIS — Z48812 Encounter for surgical aftercare following surgery on the circulatory system: Secondary | ICD-10-CM | POA: Diagnosis not present

## 2016-04-03 DIAGNOSIS — Z95828 Presence of other vascular implants and grafts: Secondary | ICD-10-CM

## 2016-04-03 DIAGNOSIS — I714 Abdominal aortic aneurysm, without rupture, unspecified: Secondary | ICD-10-CM

## 2016-04-03 NOTE — Patient Instructions (Signed)
Before your next abdominal ultrasound:  Take two Extra-Strength Gas-X capsules at bedtime the night before the test. Take another two Extra-Strength Gas-X capsules 3 hours before the test.   

## 2016-04-03 NOTE — Progress Notes (Signed)
[facility]  CC: Follow up s/p EVAR  History of Present Illness  Chad Buchanan. is a 82 y.o. (March 12, 1921) male patient of Dr. Scot Dock who is s/p endovascular aneurysm repair in May 2011 for a 5.6 cm infrarenal abdominal aortic aneurysm.  CTA abd/pelvis on 03/25/11 showed a 5.1 cm infrarenal abdominal aortic aneurysm with indwelling aortobi-iliac stent.  No evidence of endoleak.  He denies any back or abdominal pain. He takes warfarin. He has a history of atrial fib.   Pt Diabetic: No Pt smoker: former smoker, quit in 1993   Past Medical History:  Diagnosis Date  . AAA (abdominal aortic aneurysm) ([place]) 09/25/2009  . Atrial fibrillation ([place])   . BPH (benign prostatic hypertrophy)   . Cancer ([place])    skin cancer ( Basal cell)  . Chronic right hip pain   . Hyperlipidemia   . Hypertension   . Long term (current) use of anticoagulants   . Myocardial infarction 1993   Past Surgical History:  Procedure Laterality Date  . ABDOMINAL AORTIC ANEURYSM REPAIR    . EYE SURGERY     Social History Social History  Substance Use Topics  . Smoking status: Former Smoker    Quit date: 07/30/1991  . Smokeless tobacco: Not on file  . Alcohol use Yes     Comment: once per week    Family History Family History  Problem Relation Age of Onset  . Stroke Father   . Diabetes Brother    Current Outpatient Prescriptions on File Prior to Visit  Medication Sig Dispense Refill  . acetaminophen (TYLENOL) 325 MG tablet Take 2 tablets (650 mg total) by mouth every 6 (six) hours as needed for mild pain or headache.    . albuterol (PROVENTIL) (2.5 MG/3ML) 0.083% nebulizer solution Take 3 mLs (2.5 mg total) by nebulization every 6 (six) hours as needed for wheezing or shortness of breath. 75 mL 12  . finasteride (PROSCAR) 5 MG tablet Take 5 mg by mouth daily.     . furosemide (LASIX) 40 MG tablet Take 1 tablet (40 mg total) by mouth 2 (two) times daily. 30 tablet   .  lisinopril (PRINIVIL,ZESTRIL) 20 MG tablet Take 20 mg by mouth daily.      . metoprolol (LOPRESSOR) 25 MG tablet Take 0.5 tablets (12.5 mg total) by mouth 2 (two) times daily. Take 1 table ( 50mg  ) every morning and 1/2 tablet at night ( 25mg )    . neomycin-polymyxin b-dexamethasone (MAXITROL) 3.5-10000-0.1 OINT APPLY 1 APPLICATION TO OU D  4  . simvastatin (ZOCOR) 40 MG tablet Take 20 mg by mouth at bedtime.     Marland Kitchen terazosin (HYTRIN) 10 MG capsule Take 10 mg by mouth at bedtime.     . traZODone (DESYREL) 50 MG tablet Take 0.5 tablets (25 mg total) by mouth at bedtime as needed for sleep.    Marland Kitchen warfarin (COUMADIN) 5 MG tablet Take 5-7.5 mg by mouth See admin instructions. Pt takes 5mg  every day except on Sunday and Wednesday patient takes 7.5 mg     No current facility-administered medications on file prior to visit.    Allergies  Allergen Reactions  . Oxycodone Hcl     Made him sleep for 2 days  . Ultram [Tramadol Hcl]     oversedation     ROS: See HPI for pertinent positives and negatives.  Physical Examination  Vitals:   04/03/16 0935 04/03/16 0937  BP: (!) 152/97 (!) 160/98  Pulse: 79 89  Temp: 97.2 F (36.2 C)   SpO2: 96%   Weight: 200 lb (90.7 kg)   Height: 6\' 1"  (1.854 m)    Body mass index is 26.39 kg/m.  General: A&O x 3, WD, walks with a cane.  Pulmonary: Sym exp, respirations are non labored, good air movt, CTAB, no rales, rhonchi, or wheezing.  Cardiac: RRR, Nl S1, S2, + murmur   Vascular: Vessel Right Left  Radial 2+Palpable 2+Palpable  Carotid  without bruit  without bruit  Aorta Not palpable N/A  Femoral 2+Palpable 2+Palpable  Popliteal Not palpable Not palpable  PT 2+Palpable 2+Palpable  DP Not Palpable Not Palpable   Gastrointestinal: soft, NTND, -G/R, - HSM, - palpable masses, - CVAT B.  Musculoskeletal: M/S 5/5 throughout, extremities without ischemic changes. Significant kyphosis.  Neurologic: Pain and light touch intact in extremities, Motor  exam as listed above. Very hard of hearing.    CTA Abd/Pelvis Duplex (Date: 03/25/11) Stable 5.1 cm infrarenal abdominal aortic aneurysm with indwelling aortobi-iliac stent.  No evidence of endoleak.   Non-Invasive Vascular Imaging  EVAR Duplex (Date: 04/03/16)  AAA sac size: 5.10 cm x 4.93 cm; Limited visualization due to overlying bowel gas and pt body habitus.   no endoleak detected 09/27/13: 4.8 cm x 4.9 cm   Medical Decision Making  Chad Buchanan. is a 82 y.o. male who presents s/p EVAR (Date: 09/25/2009).  Pt is asymptomatic with stable sac size, based on limited visualization; common iliac arteries not visuzlized. He has palpable pedal pulses. He has no hx of stroke or TIA.    I discussed with the patient the importance of surveillance of the endograft.  The next endograft duplex will be scheduled for 18 months.  The patient will follow up with [place] in 18 months with these studies.  I emphasized the importance of maximal medical management including strict control of blood pressure, blood glucose, and lipid levels, antiplatelet agents, obtaining regular exercise, and cessation of smoking.   Thank you for allowing [place] to participate in this patient's care.  Clemon Chambers, RN, MSN, FNP-C [facility] Office: [phone]  Clinic Physician: Donzetta Matters  04/03/2016, 9:42 AM

## 2016-04-06 ENCOUNTER — Other Ambulatory Visit: Payer: Self-pay | Admitting: *Deleted

## 2016-04-07 NOTE — Addendum Note (Signed)
Addended by: Mena Goes on: 04/07/2016 04:35 PM   Modules accepted: Orders

## 2016-04-08 ENCOUNTER — Other Ambulatory Visit (HOSPITAL_COMMUNITY): Payer: Commercial Managed Care - HMO

## 2016-04-08 ENCOUNTER — Ambulatory Visit: Payer: Commercial Managed Care - HMO | Admitting: Family

## 2016-04-15 DIAGNOSIS — R7301 Impaired fasting glucose: Secondary | ICD-10-CM | POA: Diagnosis not present

## 2016-04-15 DIAGNOSIS — I480 Paroxysmal atrial fibrillation: Secondary | ICD-10-CM | POA: Diagnosis not present

## 2016-04-15 DIAGNOSIS — Z6830 Body mass index (BMI) 30.0-30.9, adult: Secondary | ICD-10-CM | POA: Diagnosis not present

## 2016-04-15 DIAGNOSIS — I714 Abdominal aortic aneurysm, without rupture: Secondary | ICD-10-CM | POA: Diagnosis not present

## 2016-04-15 DIAGNOSIS — I10 Essential (primary) hypertension: Secondary | ICD-10-CM | POA: Diagnosis not present

## 2016-05-05 DIAGNOSIS — I480 Paroxysmal atrial fibrillation: Secondary | ICD-10-CM | POA: Diagnosis not present

## 2016-05-05 DIAGNOSIS — Z7901 Long term (current) use of anticoagulants: Secondary | ICD-10-CM | POA: Diagnosis not present

## 2016-06-05 DIAGNOSIS — I480 Paroxysmal atrial fibrillation: Secondary | ICD-10-CM | POA: Diagnosis not present

## 2016-06-05 DIAGNOSIS — Z7901 Long term (current) use of anticoagulants: Secondary | ICD-10-CM | POA: Diagnosis not present

## 2016-07-03 DIAGNOSIS — Z7901 Long term (current) use of anticoagulants: Secondary | ICD-10-CM | POA: Diagnosis not present

## 2016-07-03 DIAGNOSIS — I480 Paroxysmal atrial fibrillation: Secondary | ICD-10-CM | POA: Diagnosis not present

## 2016-07-15 ENCOUNTER — Encounter (HOSPITAL_COMMUNITY): Payer: Self-pay | Admitting: Emergency Medicine

## 2016-07-15 ENCOUNTER — Emergency Department (HOSPITAL_COMMUNITY)
Admission: EM | Admit: 2016-07-15 | Discharge: 2016-07-15 | Disposition: A | Discharge status: Home / Self Care | Payer: Commercial Managed Care - HMO | Attending: Emergency Medicine | Admitting: Emergency Medicine

## 2016-07-15 DIAGNOSIS — Z85828 Personal history of other malignant neoplasm of skin: Secondary | ICD-10-CM | POA: Insufficient documentation

## 2016-07-15 DIAGNOSIS — I5031 Acute diastolic (congestive) heart failure: Secondary | ICD-10-CM | POA: Insufficient documentation

## 2016-07-15 DIAGNOSIS — I110 Hypertensive heart disease with heart failure: Secondary | ICD-10-CM | POA: Diagnosis not present

## 2016-07-15 DIAGNOSIS — Z7901 Long term (current) use of anticoagulants: Secondary | ICD-10-CM | POA: Insufficient documentation

## 2016-07-15 DIAGNOSIS — Z87891 Personal history of nicotine dependence: Secondary | ICD-10-CM | POA: Diagnosis not present

## 2016-07-15 DIAGNOSIS — R339 Retention of urine, unspecified: Secondary | ICD-10-CM | POA: Diagnosis not present

## 2016-07-15 DIAGNOSIS — I252 Old myocardial infarction: Secondary | ICD-10-CM | POA: Insufficient documentation

## 2016-07-15 LAB — URINALYSIS, ROUTINE W REFLEX MICROSCOPIC
BACTERIA UA: NONE SEEN
Bilirubin Urine: NEGATIVE
Glucose, UA: NEGATIVE mg/dL
Ketones, ur: NEGATIVE mg/dL
Leukocytes, UA: NEGATIVE
Nitrite: NEGATIVE
PROTEIN: NEGATIVE mg/dL
SPECIFIC GRAVITY, URINE: 1.009 (ref 1.005–1.030)
Squamous Epithelial / LPF: NONE SEEN
pH: 5 (ref 5.0–8.0)

## 2016-07-15 NOTE — ED Provider Notes (Addendum)
[facility] DEPT Provider Note   CSN: [id number] Arrival date & time: 07/15/16  1723     History   Chief Complaint Chief Complaint  Patient presents with  . Urinary Retention    HPI Chad Buchanan. is a 83 y.o. male. Chief complaint is urinary retention  HPI:  Patient has a history of urinary retention. Is on Proscar. Has not been able to urinate since this morning. Urinated normally yesterday. No dysuria or hematuria. Follows with Dr. Karsten Ro at [facility] urology.  No fever. No flank pain. No additional symptoms.  Past Medical History:  Diagnosis Date  . AAA (abdominal aortic aneurysm) ([place]) 09/25/2009  . Atrial fibrillation ([place])   . BPH (benign prostatic hypertrophy)   . Cancer ([place])    skin cancer ( Basal cell)  . Chronic right hip pain   . Hyperlipidemia   . Hypertension   . Long term (current) use of anticoagulants   . Myocardial infarction 1993    Patient Active Problem List   Diagnosis Date Noted  . Acute diastolic CHF (congestive heart failure) ([place]) 03/12/2015  . Acute respiratory failure with hypoxia ([place]) 03/11/2015  . Acute congestive heart failure ([place]) 03/11/2015  . Candidal dermatitis 03/11/2015  . Conjunctivitis 03/11/2015  . Fever and chills 05/02/2011  . Hypoxia 05/02/2011  . Atrial fibrillation ([place]) 05/02/2011  . Coronary atherosclerosis 05/02/2011  . Impaired fasting glucose 05/02/2011  . Unspecified hearing loss 05/02/2011  . Essential hypertension 05/02/2011  . Right hip pain 05/02/2011  . AAA (abdominal aortic aneurysm) without rupture ([place]) 05/02/2011  . Other and unspecified hyperlipidemia 05/02/2011  . Hypokalemia 05/02/2011  . Abdominal aneurysm without mention of rupture 03/25/2011    Past Surgical History:  Procedure Laterality Date  . ABDOMINAL AORTIC ANEURYSM REPAIR    . EYE SURGERY         Home Medications    Prior to Admission medications   Medication Sig Start Date End Date Taking? Authorizing Provider    finasteride (PROSCAR) 5 MG tablet Take 5 mg by mouth at bedtime.    Yes Historical Provider, MD  furosemide (LASIX) 40 MG tablet Take 1 tablet (40 mg total) by mouth 2 (two) times daily. Patient taking differently: Take 40 mg by mouth daily.  03/15/15  Yes Bonnielee Haff, MD  lisinopril (PRINIVIL,ZESTRIL) 20 MG tablet Take 20 mg by mouth daily.     Yes Historical Provider, MD  metoprolol tartrate (LOPRESSOR) 25 MG tablet Take 12.5 mg by mouth daily.   Yes Historical Provider, MD  simvastatin (ZOCOR) 40 MG tablet Take 20 mg by mouth at bedtime.    Yes Historical Provider, MD  terazosin (HYTRIN) 10 MG capsule Take 10 mg by mouth at bedtime.    Yes Historical Provider, MD  warfarin (COUMADIN) 5 MG tablet Take 5-7.5 mg by mouth every evening. Pt takes one tablet on Monday, Wednesday, and Friday.   Pt takes one and one-half tablet on Tuesday, Thursday, Saturday, and Sunday.   Yes Crist Infante, MD    Family History Family History  Problem Relation Age of Onset  . Stroke Father   . Diabetes Brother     Social History Social History  Substance Use Topics  . Smoking status: Former Smoker    Quit date: 07/30/1991  . Smokeless tobacco: Never Used  . Alcohol use Yes     Comment: once per week      Allergies   Oxycodone hcl and Ultram [tramadol hcl]   Review of Systems Review  of Systems  Constitutional: Negative for appetite change, chills, diaphoresis, fatigue and fever.  HENT: Negative for mouth sores, sore throat and trouble swallowing.   Eyes: Negative for visual disturbance.  Respiratory: Negative for cough, chest tightness, shortness of breath and wheezing.   Cardiovascular: Negative for chest pain.  Gastrointestinal: Negative for abdominal distention, abdominal pain, diarrhea, nausea and vomiting.  Endocrine: Negative for polydipsia, polyphagia and polyuria.  Genitourinary: Positive for decreased urine volume. Negative for dysuria, frequency and hematuria.  Musculoskeletal:  Negative for gait problem.  Skin: Negative for color change, pallor and rash.  Neurological: Negative for dizziness, syncope, light-headedness and headaches.  Hematological: Does not bruise/bleed easily.  Psychiatric/Behavioral: Negative for behavioral problems and confusion.     Physical Exam Updated Vital Signs BP 136/92 (BP Location: Left Arm)   Pulse 96   Temp 98 F (36.7 C) (Oral)   Resp 21   Ht 6\' 1"  (1.854 m)   Wt 200 lb (90.7 kg)   SpO2 97%   BMI 26.39 kg/m   Physical Exam  Constitutional: He is oriented to person, place, and time. He appears well-developed and well-nourished. No distress.  HENT:  Head: Normocephalic.  Eyes: Conjunctivae are normal. Pupils are equal, round, and reactive to light. No scleral icterus.  Neck: Normal range of motion. Neck supple. No thyromegaly present.  Cardiovascular: Normal rate and regular rhythm.  Exam reveals no gallop and no friction rub.   No murmur heard. Pulmonary/Chest: Effort normal and breath sounds normal. No respiratory distress. He has no wheezes. He has no rales.  Abdominal: Soft. Bowel sounds are normal. He exhibits no distension. There is no tenderness. There is no rebound.  Genitourinary:  Genitourinary Comments: Tenderness over the suprapubic abdomen. Bedside ultrasound shows distention of the urinary bladder.  Musculoskeletal: Normal range of motion.  Neurological: He is alert and oriented to person, place, and time.  Skin: Skin is warm and dry. No rash noted.  Psychiatric: He has a normal mood and affect. His behavior is normal.     ED Treatments / Results  Labs (all labs ordered are listed, but only abnormal results are displayed) Labs Reviewed  URINALYSIS, ROUTINE W REFLEX MICROSCOPIC - Abnormal; Notable for the following:       Result Value   Hgb urine dipstick SMALL (*)    All other components within normal limits    EKG  EKG Interpretation None       Radiology No results  found.  Procedures Procedures (including critical care time)  Medications Ordered in ED Medications - No data to display   Initial Impression / Assessment and Plan / ED Course  I have reviewed the triage vital signs and the nursing notes.  Pertinent labs & imaging results that were available during my care of the patient were reviewed by me and considered in my medical decision making (see chart for details).     Patient is much better after catheter placed. Drains 600 mL. He requested we discharge home. He is taking care of the catheter with leg bag at home in the past. Feels comfortable with it. We'll call his urologist for follow-up appointment  Final Clinical Impressions(s) / ED Diagnoses   Final diagnoses:  Urinary retention    New Prescriptions New Prescriptions   No medications on file     Tanna Furry, MD 07/15/16 Doran Buchanan    Tanna Furry, MD 08/12/16 1136

## 2016-07-15 NOTE — ED Notes (Signed)
ED Provider at bedside. 

## 2016-07-15 NOTE — ED Notes (Signed)
Bed: WA14 Expected date:  Expected time:  Means of arrival:  Comments: Urinary retention  

## 2016-07-15 NOTE — ED Triage Notes (Signed)
Pt was found by EVS wandering the halls looking for the ED.  Pt reports not being able to urinate since this morning.  Very hard of hearing.  Bladder scan showed 518 in bladder.  States he is taking medicine to help this and it is apparently stopped working.

## 2016-07-15 NOTE — Discharge Instructions (Signed)
Catheter removal in 5-7 days. Follow up with Dr. Karsten Ro

## 2016-07-19 ENCOUNTER — Emergency Department (HOSPITAL_COMMUNITY)
Admission: EM | Admit: 2016-07-19 | Discharge: 2016-07-19 | Disposition: A | Discharge status: Home / Self Care | Payer: Medicare HMO | Attending: Emergency Medicine | Admitting: Emergency Medicine

## 2016-07-19 ENCOUNTER — Encounter (HOSPITAL_COMMUNITY): Payer: Self-pay | Admitting: Emergency Medicine

## 2016-07-19 DIAGNOSIS — I110 Hypertensive heart disease with heart failure: Secondary | ICD-10-CM | POA: Diagnosis not present

## 2016-07-19 DIAGNOSIS — Z79899 Other long term (current) drug therapy: Secondary | ICD-10-CM | POA: Insufficient documentation

## 2016-07-19 DIAGNOSIS — R339 Retention of urine, unspecified: Secondary | ICD-10-CM | POA: Diagnosis not present

## 2016-07-19 DIAGNOSIS — T83098A Other mechanical complication of other indwelling urethral catheter, initial encounter: Secondary | ICD-10-CM | POA: Diagnosis not present

## 2016-07-19 DIAGNOSIS — I252 Old myocardial infarction: Secondary | ICD-10-CM | POA: Diagnosis not present

## 2016-07-19 DIAGNOSIS — I5031 Acute diastolic (congestive) heart failure: Secondary | ICD-10-CM | POA: Diagnosis not present

## 2016-07-19 DIAGNOSIS — Y733 Surgical instruments, materials and gastroenterology and urology devices (including sutures) associated with adverse incidents: Secondary | ICD-10-CM | POA: Insufficient documentation

## 2016-07-19 DIAGNOSIS — Z87891 Personal history of nicotine dependence: Secondary | ICD-10-CM | POA: Diagnosis not present

## 2016-07-19 DIAGNOSIS — T839XXA Unspecified complication of genitourinary prosthetic device, implant and graft, initial encounter: Secondary | ICD-10-CM

## 2016-07-19 DIAGNOSIS — Z7901 Long term (current) use of anticoagulants: Secondary | ICD-10-CM | POA: Diagnosis not present

## 2016-07-19 DIAGNOSIS — Z85828 Personal history of other malignant neoplasm of skin: Secondary | ICD-10-CM | POA: Insufficient documentation

## 2016-07-19 LAB — PROTIME-INR
INR: 1.95
PROTHROMBIN TIME: 22.5 s — AB (ref 11.4–15.2)

## 2016-07-19 LAB — CBC WITH DIFFERENTIAL/PLATELET
Basophils Absolute: 0 10*3/uL (ref 0.0–0.1)
Basophils Relative: 0 %
Eosinophils Absolute: 0.5 10*3/uL (ref 0.0–0.7)
Eosinophils Relative: 7 %
HEMATOCRIT: 35.5 % — AB (ref 39.0–52.0)
HEMOGLOBIN: 11.1 g/dL — AB (ref 13.0–17.0)
LYMPHS ABS: 0.6 10*3/uL — AB (ref 0.7–4.0)
Lymphocytes Relative: 10 %
MCH: 26.7 pg (ref 26.0–34.0)
MCHC: 31.3 g/dL (ref 30.0–36.0)
MCV: 85.5 fL (ref 78.0–100.0)
Monocytes Absolute: 0.6 10*3/uL (ref 0.1–1.0)
Monocytes Relative: 9 %
NEUTROS ABS: 4.7 10*3/uL (ref 1.7–7.7)
Neutrophils Relative %: 74 %
Platelets: 121 10*3/uL — ABNORMAL LOW (ref 150–400)
RBC: 4.15 MIL/uL — AB (ref 4.22–5.81)
RDW: 15.4 % (ref 11.5–15.5)
WBC: 6.4 10*3/uL (ref 4.0–10.5)

## 2016-07-19 LAB — BASIC METABOLIC PANEL
Anion gap: 7 (ref 5–15)
BUN: 23 mg/dL — ABNORMAL HIGH (ref 6–20)
CHLORIDE: 106 mmol/L (ref 101–111)
CO2: 28 mmol/L (ref 22–32)
Calcium: 9.1 mg/dL (ref 8.9–10.3)
Creatinine, Ser: 1.02 mg/dL (ref 0.61–1.24)
GFR calc non Af Amer: >60 mL/min (ref >60)
Glucose, Bld: 100 mg/dL — ABNORMAL HIGH (ref 65–99)
POTASSIUM: 3.9 mmol/L (ref 3.5–5.1)
SODIUM: 141 mmol/L (ref 135–145)

## 2016-07-19 LAB — URINALYSIS, ROUTINE W REFLEX MICROSCOPIC
Bilirubin Urine: NEGATIVE
Glucose, UA: NEGATIVE mg/dL
Ketones, ur: NEGATIVE mg/dL
NITRITE: NEGATIVE
Protein, ur: 30 mg/dL — AB
SPECIFIC GRAVITY, URINE: 1.004 — AB (ref 1.005–1.030)
SQUAMOUS EPITHELIAL / LPF: NONE SEEN
pH: 6 (ref 5.0–8.0)

## 2016-07-19 NOTE — ED Notes (Signed)
Bed: [facility]PT1 Expected date:  Expected time:  Means of arrival:  Comments: 

## 2016-07-19 NOTE — ED Notes (Signed)
Flushed patient foley with 60 of sterile water and got 460 back. Patient feels relief and is not in pain at this time.

## 2016-07-19 NOTE — ED Triage Notes (Signed)
Patient has a catheter in his bladder and is stating that something is not right. Patient states he is hurting in his genital area. Patient also states that he has not put out any urine.

## 2016-07-19 NOTE — Discharge Instructions (Signed)
Read the information below.  You may return to the Emergency Department at any time for worsening condition or any new symptoms that concern you. °

## 2016-07-19 NOTE — ED Provider Notes (Signed)
[facility] DEPT Provider Note   CSN: [id number] Arrival date & time: 07/19/16  0510     History   Chief Complaint Chief Complaint  Patient presents with  . Urinary Retention    HPI Chad Buchanan. is a 83 y.o. male.  HPI   Pt with hx Afib, HTN, HLD, MI, AAA, BPH on coumadin with recent ED visit 07/15/16 for urinary retention presents with foley catheter dysfunction.  Has had intermittent pink/bloody urine since placement of the catheter but was draining well and he was feeling well until overnight when the cathter stopped draining.  States he had urine draining around the catheter but not through it, was very uncomfortable, abdominal pain.  Upon arrival to ED, the nurse flushed the foley and got out a small clot and >400cc pink urine.  Now feeling much better, draining light pink urine only.  Pt denies any fevers, N/V, abdominal pain, any other abnormal bleeding.   Has appt with urology tomorrow morning.    Past Medical History:  Diagnosis Date  . AAA (abdominal aortic aneurysm) ([place]) 09/25/2009  . Atrial fibrillation ([place])   . BPH (benign prostatic hypertrophy)   . Cancer ([place])    skin cancer ( Basal cell)  . Chronic right hip pain   . Hyperlipidemia   . Hypertension   . Long term (current) use of anticoagulants   . Myocardial infarction 1993    Patient Active Problem List   Diagnosis Date Noted  . Acute diastolic CHF (congestive heart failure) ([place]) 03/12/2015  . Acute respiratory failure with hypoxia ([place]) 03/11/2015  . Acute congestive heart failure ([place]) 03/11/2015  . Candidal dermatitis 03/11/2015  . Conjunctivitis 03/11/2015  . Fever and chills 05/02/2011  . Hypoxia 05/02/2011  . Atrial fibrillation ([place]) 05/02/2011  . Coronary atherosclerosis 05/02/2011  . Impaired fasting glucose 05/02/2011  . Unspecified hearing loss 05/02/2011  . Essential hypertension 05/02/2011  . Right hip pain 05/02/2011  . AAA (abdominal aortic aneurysm) without rupture ([place])  05/02/2011  . Other and unspecified hyperlipidemia 05/02/2011  . Hypokalemia 05/02/2011  . Abdominal aneurysm without mention of rupture 03/25/2011    Past Surgical History:  Procedure Laterality Date  . ABDOMINAL AORTIC ANEURYSM REPAIR    . EYE SURGERY         Home Medications    Prior to Admission medications   Medication Sig Start Date End Date Taking? Authorizing Provider  finasteride (PROSCAR) 5 MG tablet Take 5 mg by mouth at bedtime.     Historical Provider, MD  furosemide (LASIX) 40 MG tablet Take 1 tablet (40 mg total) by mouth 2 (two) times daily. Patient taking differently: Take 40 mg by mouth daily.  03/15/15   Bonnielee Haff, MD  lisinopril (PRINIVIL,ZESTRIL) 20 MG tablet Take 20 mg by mouth daily.      Historical Provider, MD  metoprolol tartrate (LOPRESSOR) 25 MG tablet Take 12.5 mg by mouth daily.    Historical Provider, MD  simvastatin (ZOCOR) 40 MG tablet Take 20 mg by mouth at bedtime.     Historical Provider, MD  terazosin (HYTRIN) 10 MG capsule Take 10 mg by mouth at bedtime.     Historical Provider, MD  warfarin (COUMADIN) 5 MG tablet Take 5-7.5 mg by mouth every evening. Pt takes one tablet on Monday, Wednesday, and Friday.   Pt takes one and one-half tablet on Tuesday, Thursday, Saturday, and Sunday.    Crist Infante, MD    Family History Family History  Problem Relation Age  of Onset  . Stroke Father   . Diabetes Brother     Social History Social History  Substance Use Topics  . Smoking status: Former Smoker    Quit date: 07/30/1991  . Smokeless tobacco: Never Used  . Alcohol use Yes     Comment: once per week      Allergies   Oxycodone hcl and Ultram [tramadol hcl]   Review of Systems Review of Systems  All other systems reviewed and are negative.    Physical Exam Updated Vital Signs BP 174/95   Pulse 65   Temp 98.1 F (36.7 C) (Oral)   Resp 18   Ht 6\' 1"  (1.854 m)   Wt 90.7 kg   SpO2 95%   BMI 26.39 kg/m   Physical Exam    Constitutional: He appears well-developed and well-nourished. No distress.  HENT:  Head: Normocephalic and atraumatic.  Neck: Neck supple.  Cardiovascular: Normal rate and regular rhythm.   Murmur heard. Pulmonary/Chest: Effort normal and breath sounds normal. No respiratory distress. He has no wheezes. He has no rales.  Abdominal: Soft. He exhibits no distension and no mass. There is no tenderness. There is no rebound and no guarding.  Genitourinary: Penis normal. Uncircumcised.  Genitourinary Comments: Foley catheter in place.  No erythema, no drainage around catheter after it was flushed.    Musculoskeletal: He exhibits no edema.  Neurological: He is alert. He exhibits normal muscle tone.  Skin: He is not diaphoretic.  Nursing note and vitals reviewed.    ED Treatments / Results  Labs (all labs ordered are listed, but only abnormal results are displayed) Labs Reviewed  URINALYSIS, ROUTINE W REFLEX MICROSCOPIC - Abnormal; Notable for the following:       Result Value   Specific Gravity, Urine 1.004 (*)    Hgb urine dipstick LARGE (*)    Protein, ur 30 (*)    Leukocytes, UA MODERATE (*)    Bacteria, UA FEW (*)    All other components within normal limits  BASIC METABOLIC PANEL - Abnormal; Notable for the following:    Glucose, Bld 100 (*)    BUN 23 (*)    All other components within normal limits  CBC WITH DIFFERENTIAL/PLATELET - Abnormal; Notable for the following:    RBC 4.15 (*)    Hemoglobin 11.1 (*)    HCT 35.5 (*)    Platelets 121 (*)    Lymphs Abs 0.6 (*)    All other components within normal limits  PROTIME-INR - Abnormal; Notable for the following:    Prothrombin Time 22.5 (*)    All other components within normal limits  URINE CULTURE    EKG  EKG Interpretation None       Radiology No results found.  Procedures Procedures (including critical care time)  Medications Ordered in ED Medications - No data to display   Initial Impression /  Assessment and Plan / ED Course  I have reviewed the triage vital signs and the nursing notes.  Pertinent labs & imaging results that were available during my care of the patient were reviewed by me and considered in my medical decision making (see chart for details).     Afebrile, nontoxic patient with foley catheter dysfunction with clot flushed and light pink urine draining.  Labs reassuring.  Monitored for urine output with continued like red/pink urine.  Pt asymptomatic following flushed catheter.  Reviewed labs and plan with Dr Jeneen Rinks.   D/C home with urology follow up  tomorrow morning as planned (Dr Karsten Ro).  Discussed result, findings, treatment, and follow up  with patient.  Pt given return precautions.  Pt verbalizes understanding and agrees with plan.       Final Clinical Impressions(s) / ED Diagnoses   Final diagnoses:  Problem with Foley catheter, initial encounter [facility])    New Prescriptions Discharge Medication List as of 07/19/2016  8:01 AM       Clayton Bibles, PA-C 07/19/16 1222    Tanna Furry, MD 07/31/16 2336

## 2016-07-19 NOTE — ED Notes (Signed)
Patient is having urinary retention and having excruciating pain.

## 2016-07-20 DIAGNOSIS — R310 Gross hematuria: Secondary | ICD-10-CM | POA: Diagnosis not present

## 2016-07-20 DIAGNOSIS — R338 Other retention of urine: Secondary | ICD-10-CM | POA: Diagnosis not present

## 2016-07-21 LAB — URINE CULTURE: Culture: 40000 — AB

## 2016-07-22 ENCOUNTER — Telehealth: Payer: Self-pay | Admitting: Emergency Medicine

## 2016-07-22 NOTE — Telephone Encounter (Signed)
Post ED Visit - Positive Culture Follow-up  Culture report reviewed by antimicrobial stewardship pharmacist:  []  Elenor Quinones, Pharm.D. [x]  Heide Guile, Pharm.D., BCPS []  Parks Neptune, Pharm.D. []  Alycia Rossetti, Pharm.D., BCPS []  [place], [place].D., BCPS, AAHIVP []  Legrand Como, Pharm.D., BCPS, AAHIVP []  Milus Glazier, Pharm.D. []  Stephens November, [place].D.  Positive urine culture Treated with none, contaminant, no further patient follow-up is required at this time.  Hazle Nordmann 07/22/2016, 3:24 PM

## 2016-07-27 DIAGNOSIS — R338 Other retention of urine: Secondary | ICD-10-CM | POA: Diagnosis not present

## 2016-07-31 DIAGNOSIS — I480 Paroxysmal atrial fibrillation: Secondary | ICD-10-CM | POA: Diagnosis not present

## 2016-07-31 DIAGNOSIS — Z7901 Long term (current) use of anticoagulants: Secondary | ICD-10-CM | POA: Diagnosis not present

## 2016-08-28 DIAGNOSIS — I10 Essential (primary) hypertension: Secondary | ICD-10-CM | POA: Diagnosis not present

## 2016-08-28 DIAGNOSIS — E784 Other hyperlipidemia: Secondary | ICD-10-CM | POA: Diagnosis not present

## 2016-08-28 DIAGNOSIS — R7301 Impaired fasting glucose: Secondary | ICD-10-CM | POA: Diagnosis not present

## 2016-09-04 DIAGNOSIS — L84 Corns and callosities: Secondary | ICD-10-CM | POA: Diagnosis not present

## 2016-09-04 DIAGNOSIS — D6489 Other specified anemias: Secondary | ICD-10-CM | POA: Diagnosis not present

## 2016-09-04 DIAGNOSIS — I714 Abdominal aortic aneurysm, without rupture: Secondary | ICD-10-CM | POA: Diagnosis not present

## 2016-09-04 DIAGNOSIS — G458 Other transient cerebral ischemic attacks and related syndromes: Secondary | ICD-10-CM | POA: Diagnosis not present

## 2016-09-04 DIAGNOSIS — I2510 Atherosclerotic heart disease of native coronary artery without angina pectoris: Secondary | ICD-10-CM | POA: Diagnosis not present

## 2016-09-04 DIAGNOSIS — I480 Paroxysmal atrial fibrillation: Secondary | ICD-10-CM | POA: Diagnosis not present

## 2016-09-04 DIAGNOSIS — R7301 Impaired fasting glucose: Secondary | ICD-10-CM | POA: Diagnosis not present

## 2016-09-04 DIAGNOSIS — Z0000 Encounter for general adult medical examination without abnormal findings: Secondary | ICD-10-CM | POA: Diagnosis not present

## 2016-09-04 DIAGNOSIS — D692 Other nonthrombocytopenic purpura: Secondary | ICD-10-CM | POA: Diagnosis not present

## 2016-09-24 ENCOUNTER — Ambulatory Visit (INDEPENDENT_AMBULATORY_CARE_PROVIDER_SITE_OTHER): Payer: Medicare HMO | Admitting: Podiatry

## 2016-09-24 ENCOUNTER — Encounter: Payer: Self-pay | Admitting: Podiatry

## 2016-09-24 VITALS — BP 142/78 | HR 63

## 2016-09-24 DIAGNOSIS — M2042 Other hammer toe(s) (acquired), left foot: Secondary | ICD-10-CM | POA: Diagnosis not present

## 2016-09-24 DIAGNOSIS — Q828 Other specified congenital malformations of skin: Secondary | ICD-10-CM | POA: Diagnosis not present

## 2016-09-24 DIAGNOSIS — M2041 Other hammer toe(s) (acquired), right foot: Secondary | ICD-10-CM

## 2016-09-24 DIAGNOSIS — M79672 Pain in left foot: Secondary | ICD-10-CM | POA: Diagnosis not present

## 2016-09-24 NOTE — Progress Notes (Signed)
   Subjective:    Patient ID: Chad Lair., male    DOB: 20-Sep-1920, 83 y.o.   MRN: [id number]  HPI 83 year old male presents the office they for concerns of a callus of left foot and he points to submetatarsal 3. This area is painful with pressure in shoes. Denies any redness or drainage or any swelling. This has been ongoing for several months. He also states that he has a hammertoe left foot to the third toe. He said no recent treatment for this but does cause irritation shoes.   Review of Systems  Genitourinary: Positive for frequency and urgency.  Musculoskeletal: Positive for gait problem.  All other systems reviewed and are negative.      Objective:   Physical Exam General: AAO x3, NAD  Dermatological: Left foot metatarsal 3 hyperkeratotic lesion. After debridement there is no underlying ulceration, drainage or any clinical signs of infection. There is no other open lesions or pre-ulcerative lesions identified today. There are no clinical signs of infection.  Vascular: Dorsalis Pedis artery and Posterior Tibial artery pedal pulses are 2/4 bilateral with immedate capillary fill time.  There is no pain with calf compression, swelling, warmth, erythema. Chronic b/l lower extremity edema.  Neruologic: Grossly intact via light touch bilateral.  Protective threshold with Semmes Wienstein monofilament intact to all pedal sites bilateral.   Musculoskeletal: Hammertoes are present bilaterally with the left 3rd most significant.  Muscular strength 5/5 in all groups tested bilateral.  Gait: Unassisted, Nonantalgic.     Assessment & Plan:  83 year old male left sub-metatarsal 3 hyperkeratotic lesion with left third digit hammertoe -Treatment options discussed including all alternatives, risks, and complications -Etiology of symptoms were discussed -Lesion sharply debrided 1 without complications or bleeding. I dispensed offloading pads to the area. Discussed with him likely  recurrence. -Offloading pads were dispensed with hammertoe. Discussed shoe gear changes as well. -RTC 3 months.  Celesta Gentile, DPM

## 2016-10-06 DIAGNOSIS — Z7901 Long term (current) use of anticoagulants: Secondary | ICD-10-CM | POA: Diagnosis not present

## 2016-10-06 DIAGNOSIS — I480 Paroxysmal atrial fibrillation: Secondary | ICD-10-CM | POA: Diagnosis not present

## 2016-10-28 DIAGNOSIS — Z7901 Long term (current) use of anticoagulants: Secondary | ICD-10-CM | POA: Diagnosis not present

## 2016-11-18 DIAGNOSIS — Z7901 Long term (current) use of anticoagulants: Secondary | ICD-10-CM | POA: Diagnosis not present

## 2016-11-18 DIAGNOSIS — I480 Paroxysmal atrial fibrillation: Secondary | ICD-10-CM | POA: Diagnosis not present

## 2016-12-16 DIAGNOSIS — Z7901 Long term (current) use of anticoagulants: Secondary | ICD-10-CM | POA: Diagnosis not present

## 2016-12-16 DIAGNOSIS — I480 Paroxysmal atrial fibrillation: Secondary | ICD-10-CM | POA: Diagnosis not present

## 2016-12-23 ENCOUNTER — Ambulatory Visit: Payer: Medicare HMO | Admitting: Podiatry

## 2017-01-05 DIAGNOSIS — Z6829 Body mass index (BMI) 29.0-29.9, adult: Secondary | ICD-10-CM | POA: Diagnosis not present

## 2017-01-05 DIAGNOSIS — D692 Other nonthrombocytopenic purpura: Secondary | ICD-10-CM | POA: Diagnosis not present

## 2017-01-05 DIAGNOSIS — B351 Tinea unguium: Secondary | ICD-10-CM | POA: Diagnosis not present

## 2017-01-05 DIAGNOSIS — I480 Paroxysmal atrial fibrillation: Secondary | ICD-10-CM | POA: Diagnosis not present

## 2017-01-05 DIAGNOSIS — Z7901 Long term (current) use of anticoagulants: Secondary | ICD-10-CM | POA: Diagnosis not present

## 2017-01-05 DIAGNOSIS — I10 Essential (primary) hypertension: Secondary | ICD-10-CM | POA: Diagnosis not present

## 2017-01-05 DIAGNOSIS — I509 Heart failure, unspecified: Secondary | ICD-10-CM | POA: Diagnosis not present

## 2017-01-05 DIAGNOSIS — K5909 Other constipation: Secondary | ICD-10-CM | POA: Diagnosis not present

## 2017-01-18 ENCOUNTER — Encounter: Payer: Self-pay | Admitting: Podiatry

## 2017-01-18 ENCOUNTER — Ambulatory Visit (INDEPENDENT_AMBULATORY_CARE_PROVIDER_SITE_OTHER): Payer: Medicare HMO | Admitting: Podiatry

## 2017-01-18 DIAGNOSIS — Z9229 Personal history of other drug therapy: Secondary | ICD-10-CM

## 2017-01-18 DIAGNOSIS — M79676 Pain in unspecified toe(s): Secondary | ICD-10-CM

## 2017-01-18 DIAGNOSIS — L84 Corns and callosities: Secondary | ICD-10-CM | POA: Diagnosis not present

## 2017-01-18 DIAGNOSIS — B351 Tinea unguium: Secondary | ICD-10-CM | POA: Diagnosis not present

## 2017-01-18 DIAGNOSIS — I739 Peripheral vascular disease, unspecified: Secondary | ICD-10-CM

## 2017-01-18 NOTE — Progress Notes (Signed)
Subjective: 83 y.o. returns the office today for painful, elongated, thickened toenails which they cannot trim themself. Denies any redness or drainage around the nails.  He also states he is a reoccurring callus to the ball of the left foot. Denies any drainage or pus. He is currently on Coumadin therapy. Denies any acute changes since last appointment and no new complaints today. Denies any systemic complaints such as fevers, chills, nausea, vomiting.   Objective: AAO 3, NAD DP/PT pulses decreased (this is chronic and unchanged per previous notes) Nails hypertrophic, dystrophic, elongated, brittle, discolored 10. There is tenderness overlying the nails 1-5 bilaterally. There is no surrounding erythema or drainage along the nail sites. Hyperkeratotic lesion left foot second metatarsal 3. No underlying ulceration, drainage or any signs of infection. No open lesions or pre-ulcerative lesions are identified. Superficial abrasion/scab of the dorsal aspect the left fourth toe. No swelling edema, erythema, increase in warmth. No other areas of tenderness bilateral lower extremities. No overlying edema, erythema, increased warmth. No pain with calf compression, swelling, warmth, erythema.  Assessment: Patient presents with symptomatic onychomycosis; hyperkeratotic lesion currently on antivibration therapy  Plan: -Treatment options including alternatives, risks, complications were discussed -Nails sharply debrided 10 without complication/bleeding. -Hyperkeratotic lesion sharply debrided 1 without complications or bleeding. -If the wound to left fourth toe does not heal in the next 1 week to call the office for follow-up. Monitor for any signs or symptoms of infection. -Discussed daily foot inspection. If there are any changes, to call the office immediately.  -Follow-up in 3 months or sooner if any problems are to arise. In the meantime, encouraged to call the office with any questions, concerns,  changes symptoms.  Celesta Gentile, DPM

## 2017-02-03 DIAGNOSIS — Z7901 Long term (current) use of anticoagulants: Secondary | ICD-10-CM | POA: Diagnosis not present

## 2017-02-03 DIAGNOSIS — I480 Paroxysmal atrial fibrillation: Secondary | ICD-10-CM | POA: Diagnosis not present

## 2017-03-12 DIAGNOSIS — Z7901 Long term (current) use of anticoagulants: Secondary | ICD-10-CM | POA: Diagnosis not present

## 2017-03-12 DIAGNOSIS — Z23 Encounter for immunization: Secondary | ICD-10-CM | POA: Diagnosis not present

## 2017-03-12 DIAGNOSIS — I480 Paroxysmal atrial fibrillation: Secondary | ICD-10-CM | POA: Diagnosis not present

## 2017-03-15 DIAGNOSIS — H02132 Senile ectropion of right lower eyelid: Secondary | ICD-10-CM | POA: Diagnosis not present

## 2017-03-15 DIAGNOSIS — Z961 Presence of intraocular lens: Secondary | ICD-10-CM | POA: Diagnosis not present

## 2017-03-15 DIAGNOSIS — H35033 Hypertensive retinopathy, bilateral: Secondary | ICD-10-CM | POA: Diagnosis not present

## 2017-03-15 DIAGNOSIS — H35373 Puckering of macula, bilateral: Secondary | ICD-10-CM | POA: Diagnosis not present

## 2017-04-12 DIAGNOSIS — Z7901 Long term (current) use of anticoagulants: Secondary | ICD-10-CM | POA: Diagnosis not present

## 2017-04-19 ENCOUNTER — Encounter: Payer: Self-pay | Admitting: Podiatry

## 2017-04-19 ENCOUNTER — Ambulatory Visit: Payer: Medicare HMO | Admitting: Podiatry

## 2017-04-19 DIAGNOSIS — M79676 Pain in unspecified toe(s): Secondary | ICD-10-CM

## 2017-04-19 DIAGNOSIS — Q828 Other specified congenital malformations of skin: Secondary | ICD-10-CM

## 2017-04-19 DIAGNOSIS — D689 Coagulation defect, unspecified: Secondary | ICD-10-CM | POA: Diagnosis not present

## 2017-04-19 DIAGNOSIS — B351 Tinea unguium: Secondary | ICD-10-CM | POA: Diagnosis not present

## 2017-04-21 NOTE — Progress Notes (Signed)
Subjective: 83 y.o. returns the office today for painful, elongated, thickened toenails which they cannot trim themself. Denies any redness or drainage around the nails.  He also states he is a reoccurring callus to the ball of the left foot. Denies any drainage or pus.Denies any acute changes since last appointment and no new complaints today. Denies any systemic complaints such as fevers, chills, nausea, vomiting.   He is currently on Coumadin  Objective: AAO 3, NAD DP/PT pulses decreased (this is chronic and unchanged per previous notes) Nails hypertrophic, dystrophic, elongated, brittle, discolored 10. There is tenderness overlying the nails 1-5 bilaterally. There is no surrounding erythema or drainage along the nail sites. Hyperkeratotic lesion left foot second metatarsal 3. No underlying ulceration, drainage or any signs of infection. No open lesions or pre-ulcerative lesions are identified. There is no skin breakdown to the fourth toe identified today no other areas of tenderness bilateral lower extremities. No overlying edema, erythema, increased warmth. No pain with calf compression, swelling, warmth, erythema.  Assessment: Patient presents with symptomatic onychomycosis; hyperkeratotic lesion currently on anticoagulation therapy  Plan: -Treatment options including alternatives, risks, complications were discussed -Nails sharply debrided 10 without complication/bleeding. -Hyperkeratotic lesion sharply debrided 1 without complications or bleeding. -Discussed daily foot inspection. If there are any changes, to call the office immediately.  -Follow-up in 3 months or sooner if any problems are to arise. In the meantime, encouraged to call the office with any questions, concerns, changes symptoms.  Celesta Gentile, DPM

## 2017-05-10 DIAGNOSIS — I480 Paroxysmal atrial fibrillation: Secondary | ICD-10-CM | POA: Diagnosis not present

## 2017-05-10 DIAGNOSIS — Z7901 Long term (current) use of anticoagulants: Secondary | ICD-10-CM | POA: Diagnosis not present

## 2017-05-27 DIAGNOSIS — I10 Essential (primary) hypertension: Secondary | ICD-10-CM | POA: Diagnosis not present

## 2017-05-27 DIAGNOSIS — Z6830 Body mass index (BMI) 30.0-30.9, adult: Secondary | ICD-10-CM | POA: Diagnosis not present

## 2017-05-27 DIAGNOSIS — I480 Paroxysmal atrial fibrillation: Secondary | ICD-10-CM | POA: Diagnosis not present

## 2017-05-27 DIAGNOSIS — R7301 Impaired fasting glucose: Secondary | ICD-10-CM | POA: Diagnosis not present

## 2017-05-27 DIAGNOSIS — M4808 Spinal stenosis, sacral and sacrococcygeal region: Secondary | ICD-10-CM | POA: Diagnosis not present

## 2017-06-28 DIAGNOSIS — I480 Paroxysmal atrial fibrillation: Secondary | ICD-10-CM | POA: Diagnosis not present

## 2017-06-28 DIAGNOSIS — Z7901 Long term (current) use of anticoagulants: Secondary | ICD-10-CM | POA: Diagnosis not present

## 2017-07-16 ENCOUNTER — Encounter: Payer: Self-pay | Admitting: Podiatry

## 2017-07-16 ENCOUNTER — Ambulatory Visit: Payer: Medicare HMO | Admitting: Podiatry

## 2017-07-16 DIAGNOSIS — M79676 Pain in unspecified toe(s): Secondary | ICD-10-CM | POA: Diagnosis not present

## 2017-07-16 DIAGNOSIS — Q828 Other specified congenital malformations of skin: Secondary | ICD-10-CM | POA: Diagnosis not present

## 2017-07-16 DIAGNOSIS — D688 Other specified coagulation defects: Secondary | ICD-10-CM

## 2017-07-16 DIAGNOSIS — B351 Tinea unguium: Secondary | ICD-10-CM

## 2017-07-16 DIAGNOSIS — D689 Coagulation defect, unspecified: Secondary | ICD-10-CM

## 2017-07-16 NOTE — Progress Notes (Signed)
Complaint:  Visit Type: Patient returns to my office for continued preventative foot care services. Complaint: Patient states" my nails have grown long and thick and become painful to walk and wear shoes" Patient has been diagnosed with spinal stenosis. The patient presents for preventative foot care services. No changes to ROS.  Patient has painful callus left forefoot.  Podiatric Exam: Vascular: dorsalis pedis and posterior tibial pulses are palpable bilateral. Capillary return is immediate. Temperature gradient is WNL. Skin turgor WNL  Sensorium: Normal Semmes Weinstein monofilament test. Normal tactile sensation bilaterally. Nail Exam: Pt has thick disfigured discolored nails with subungual debris noted bilateral entire nail hallux through fifth toenails Ulcer Exam: There is no evidence of ulcer or pre-ulcerative changes or infection. Orthopedic Exam: Muscle tone and strength are WNL. No limitations in general ROM. No crepitus or effusions noted. Foot type and digits show no abnormalities. Bony prominences are unremarkable. Skin: No Porokeratosis. No infection or ulcers.  Callus left forefoot.  Diagnosis:  Onychomycosis, , Pain in right toe, pain in left toes  Treatment & Plan Procedures and Treatment: Consent by patient was obtained for treatment procedures.   Debridement of mycotic and hypertrophic toenails, 1 through 5 bilateral and clearing of subungual debris. No ulceration, no infection noted.  Return Visit-Office Procedure: Patient instructed to return to the office for a follow up visit 10 weeks  for continued evaluation and treatment.    Gardiner Barefoot DPM

## 2017-07-20 ENCOUNTER — Ambulatory Visit: Payer: Medicare HMO | Admitting: Podiatry

## 2017-07-29 DIAGNOSIS — Z7901 Long term (current) use of anticoagulants: Secondary | ICD-10-CM | POA: Diagnosis not present

## 2017-08-05 DIAGNOSIS — R351 Nocturia: Secondary | ICD-10-CM | POA: Diagnosis not present

## 2017-08-05 DIAGNOSIS — N401 Enlarged prostate with lower urinary tract symptoms: Secondary | ICD-10-CM | POA: Diagnosis not present

## 2017-08-24 DIAGNOSIS — Z7901 Long term (current) use of anticoagulants: Secondary | ICD-10-CM | POA: Diagnosis not present

## 2017-09-23 DIAGNOSIS — Z7901 Long term (current) use of anticoagulants: Secondary | ICD-10-CM | POA: Diagnosis not present

## 2017-09-26 ENCOUNTER — Emergency Department (HOSPITAL_COMMUNITY): Payer: Medicare HMO

## 2017-09-26 ENCOUNTER — Encounter (HOSPITAL_COMMUNITY): Payer: Self-pay | Admitting: Internal Medicine

## 2017-09-26 ENCOUNTER — Inpatient Hospital Stay (HOSPITAL_COMMUNITY)
Admission: EM | Admit: 2017-09-26 | Discharge: 2017-09-30 | DRG: 193 | Disposition: A | Discharge status: Home Health | Payer: Medicare HMO | Attending: Internal Medicine | Admitting: Internal Medicine

## 2017-09-26 DIAGNOSIS — J111 Influenza due to unidentified influenza virus with other respiratory manifestations: Secondary | ICD-10-CM | POA: Diagnosis present

## 2017-09-26 DIAGNOSIS — R531 Weakness: Secondary | ICD-10-CM | POA: Diagnosis not present

## 2017-09-26 DIAGNOSIS — Y92002 Bathroom of unspecified non-institutional (private) residence single-family (private) house as the place of occurrence of the external cause: Secondary | ICD-10-CM | POA: Diagnosis not present

## 2017-09-26 DIAGNOSIS — Z885 Allergy status to narcotic agent status: Secondary | ICD-10-CM

## 2017-09-26 DIAGNOSIS — I480 Paroxysmal atrial fibrillation: Secondary | ICD-10-CM | POA: Diagnosis present

## 2017-09-26 DIAGNOSIS — Z66 Do not resuscitate: Secondary | ICD-10-CM | POA: Diagnosis present

## 2017-09-26 DIAGNOSIS — L8990 Pressure ulcer of unspecified site, unspecified stage: Secondary | ICD-10-CM | POA: Diagnosis present

## 2017-09-26 DIAGNOSIS — I130 Hypertensive heart and chronic kidney disease with heart failure and stage 1 through stage 4 chronic kidney disease, or unspecified chronic kidney disease: Secondary | ICD-10-CM | POA: Diagnosis not present

## 2017-09-26 DIAGNOSIS — I5032 Chronic diastolic (congestive) heart failure: Secondary | ICD-10-CM | POA: Diagnosis not present

## 2017-09-26 DIAGNOSIS — J9601 Acute respiratory failure with hypoxia: Secondary | ICD-10-CM | POA: Diagnosis not present

## 2017-09-26 DIAGNOSIS — N182 Chronic kidney disease, stage 2 (mild): Secondary | ICD-10-CM | POA: Diagnosis not present

## 2017-09-26 DIAGNOSIS — I252 Old myocardial infarction: Secondary | ICD-10-CM

## 2017-09-26 DIAGNOSIS — E785 Hyperlipidemia, unspecified: Secondary | ICD-10-CM | POA: Diagnosis present

## 2017-09-26 DIAGNOSIS — W19XXXA Unspecified fall, initial encounter: Secondary | ICD-10-CM | POA: Diagnosis present

## 2017-09-26 DIAGNOSIS — K5900 Constipation, unspecified: Secondary | ICD-10-CM | POA: Diagnosis present

## 2017-09-26 DIAGNOSIS — R296 Repeated falls: Secondary | ICD-10-CM | POA: Diagnosis present

## 2017-09-26 DIAGNOSIS — S299XXA Unspecified injury of thorax, initial encounter: Secondary | ICD-10-CM | POA: Diagnosis not present

## 2017-09-26 DIAGNOSIS — R5381 Other malaise: Secondary | ICD-10-CM | POA: Diagnosis present

## 2017-09-26 DIAGNOSIS — Z20828 Contact with and (suspected) exposure to other viral communicable diseases: Secondary | ICD-10-CM | POA: Diagnosis present

## 2017-09-26 DIAGNOSIS — Z7901 Long term (current) use of anticoagulants: Secondary | ICD-10-CM | POA: Diagnosis not present

## 2017-09-26 DIAGNOSIS — N400 Enlarged prostate without lower urinary tract symptoms: Secondary | ICD-10-CM | POA: Diagnosis present

## 2017-09-26 DIAGNOSIS — F0390 Unspecified dementia without behavioral disturbance: Secondary | ICD-10-CM | POA: Diagnosis not present

## 2017-09-26 DIAGNOSIS — G8929 Other chronic pain: Secondary | ICD-10-CM | POA: Diagnosis present

## 2017-09-26 DIAGNOSIS — M25551 Pain in right hip: Secondary | ICD-10-CM | POA: Diagnosis present

## 2017-09-26 DIAGNOSIS — I10 Essential (primary) hypertension: Secondary | ICD-10-CM | POA: Diagnosis not present

## 2017-09-26 DIAGNOSIS — I714 Abdominal aortic aneurysm, without rupture, unspecified: Secondary | ICD-10-CM | POA: Diagnosis present

## 2017-09-26 DIAGNOSIS — I482 Chronic atrial fibrillation, unspecified: Secondary | ICD-10-CM

## 2017-09-26 DIAGNOSIS — J189 Pneumonia, unspecified organism: Principal | ICD-10-CM | POA: Diagnosis present

## 2017-09-26 DIAGNOSIS — H9190 Unspecified hearing loss, unspecified ear: Secondary | ICD-10-CM | POA: Diagnosis present

## 2017-09-26 DIAGNOSIS — Z87891 Personal history of nicotine dependence: Secondary | ICD-10-CM

## 2017-09-26 DIAGNOSIS — D631 Anemia in chronic kidney disease: Secondary | ICD-10-CM | POA: Diagnosis not present

## 2017-09-26 DIAGNOSIS — Z79899 Other long term (current) drug therapy: Secondary | ICD-10-CM | POA: Diagnosis not present

## 2017-09-26 DIAGNOSIS — R2689 Other abnormalities of gait and mobility: Secondary | ICD-10-CM | POA: Diagnosis present

## 2017-09-26 DIAGNOSIS — R918 Other nonspecific abnormal finding of lung field: Secondary | ICD-10-CM | POA: Diagnosis not present

## 2017-09-26 DIAGNOSIS — R404 Transient alteration of awareness: Secondary | ICD-10-CM | POA: Diagnosis not present

## 2017-09-26 LAB — CBC WITH DIFFERENTIAL/PLATELET
BASOS ABS: 0 10*3/uL (ref 0.0–0.1)
Basophils Relative: 0 %
Eosinophils Absolute: 0 10*3/uL (ref 0.0–0.7)
Eosinophils Relative: 0 %
HEMATOCRIT: 36.2 % — AB (ref 39.0–52.0)
Hemoglobin: 10.9 g/dL — ABNORMAL LOW (ref 13.0–17.0)
LYMPHS ABS: 0.5 10*3/uL — AB (ref 0.7–4.0)
LYMPHS PCT: 10 %
MCH: 26.5 pg (ref 26.0–34.0)
MCHC: 30.1 g/dL (ref 30.0–36.0)
MCV: 88.1 fL (ref 78.0–100.0)
MONO ABS: 0.3 10*3/uL (ref 0.1–1.0)
MONOS PCT: 6 %
Neutro Abs: 4.1 10*3/uL (ref 1.7–7.7)
Neutrophils Relative %: 84 %
Platelets: 127 10*3/uL — ABNORMAL LOW (ref 150–400)
RBC: 4.11 MIL/uL — ABNORMAL LOW (ref 4.22–5.81)
RDW: 16.1 % — AB (ref 11.5–15.5)
WBC: 4.9 10*3/uL (ref 4.0–10.5)

## 2017-09-26 LAB — URINALYSIS, ROUTINE W REFLEX MICROSCOPIC
BILIRUBIN URINE: NEGATIVE
GLUCOSE, UA: NEGATIVE mg/dL
Ketones, ur: NEGATIVE mg/dL
NITRITE: NEGATIVE
PH: 5 (ref 5.0–8.0)
Protein, ur: NEGATIVE mg/dL
SPECIFIC GRAVITY, URINE: 1.011 (ref 1.005–1.030)

## 2017-09-26 LAB — COMPREHENSIVE METABOLIC PANEL
ALBUMIN: 3.5 g/dL (ref 3.5–5.0)
ALK PHOS: 60 U/L (ref 38–126)
ALT: 11 U/L — ABNORMAL LOW (ref 17–63)
AST: 21 U/L (ref 15–41)
Anion gap: 11 (ref 5–15)
BUN: 20 mg/dL (ref 6–20)
CALCIUM: 8.8 mg/dL — AB (ref 8.9–10.3)
CHLORIDE: 102 mmol/L (ref 101–111)
CO2: 23 mmol/L (ref 22–32)
Creatinine, Ser: 1.14 mg/dL (ref 0.61–1.24)
GFR calc non Af Amer: 53 mL/min — ABNORMAL LOW (ref >60)
GLUCOSE: 107 mg/dL — AB (ref 65–99)
Potassium: 4.6 mmol/L (ref 3.5–5.1)
SODIUM: 136 mmol/L (ref 135–145)
Total Bilirubin: 1 mg/dL (ref 0.3–1.2)
Total Protein: 6.4 g/dL — ABNORMAL LOW (ref 6.5–8.1)

## 2017-09-26 LAB — TROPONIN I
TROPONIN I: 0.04 ng/mL — AB (ref <0.03)
Troponin I: 0.04 ng/mL (ref <0.03)

## 2017-09-26 LAB — I-STAT CG4 LACTIC ACID, ED
LACTIC ACID, VENOUS: 1.5 mmol/L (ref 0.5–1.9)
LACTIC ACID, VENOUS: 0.82 mmol/L (ref 0.5–1.9)

## 2017-09-26 LAB — PROTIME-INR
INR: 2.17
Prothrombin Time: 24 seconds — ABNORMAL HIGH (ref 11.4–15.2)

## 2017-09-26 LAB — MAGNESIUM: Magnesium: 2.1 mg/dL (ref 1.7–2.4)

## 2017-09-26 LAB — BRAIN NATRIURETIC PEPTIDE: B Natriuretic Peptide: 287 pg/mL — ABNORMAL HIGH (ref 0.0–100.0)

## 2017-09-26 LAB — TSH: TSH: 1.386 u[IU]/mL (ref 0.350–4.500)

## 2017-09-26 MED ORDER — WARFARIN - PHARMACIST DOSING INPATIENT: Freq: Every day | Status: DC

## 2017-09-26 MED ORDER — ONDANSETRON HCL 4 MG PO TABS: 4.0000 mg | ORAL_TABLET | Freq: Four times a day (QID) | ORAL | Status: DC | PRN

## 2017-09-26 MED ORDER — FUROSEMIDE 40 MG PO TABS
40.0000 mg | ORAL_TABLET | Freq: Every day | ORAL | Status: DC
  Administered 2017-09-27: 40 mg via ORAL
  Filled 2017-09-26: qty 1

## 2017-09-26 MED ORDER — ACETAMINOPHEN 650 MG RE SUPP: 650.0000 mg | Freq: Four times a day (QID) | RECTAL | Status: DC | PRN

## 2017-09-26 MED ORDER — SIMVASTATIN 20 MG PO TABS
20.0000 mg | ORAL_TABLET | Freq: Every day | ORAL | Status: DC
  Administered 2017-09-27 – 2017-09-29 (×4): 20 mg via ORAL
  Filled 2017-09-26 (×4): qty 1

## 2017-09-26 MED ORDER — FINASTERIDE 5 MG PO TABS
5.0000 mg | ORAL_TABLET | Freq: Every day | ORAL | Status: DC
  Administered 2017-09-27 – 2017-09-29 (×4): 5 mg via ORAL
  Filled 2017-09-26 (×4): qty 1

## 2017-09-26 MED ORDER — SODIUM CHLORIDE 0.9 % IV SOLN
500.0000 mg | Freq: Once | INTRAVENOUS | Status: AC
  Administered 2017-09-26: 500 mg via INTRAVENOUS
  Filled 2017-09-26: qty 500

## 2017-09-26 MED ORDER — SODIUM CHLORIDE 0.9 % IV BOLUS
1000.0000 mL | Freq: Once | INTRAVENOUS | Status: AC
  Administered 2017-09-26: 1000 mL via INTRAVENOUS

## 2017-09-26 MED ORDER — IOPAMIDOL (ISOVUE-370) INJECTION 76%
100.0000 mL | Freq: Once | INTRAVENOUS | Status: AC | PRN
  Administered 2017-09-26: 100 mL via INTRAVENOUS

## 2017-09-26 MED ORDER — SODIUM CHLORIDE 0.9 % IV SOLN
1.0000 g | Freq: Once | INTRAVENOUS | Status: AC
  Administered 2017-09-26: 1 g via INTRAVENOUS
  Filled 2017-09-26: qty 10

## 2017-09-26 MED ORDER — IOPAMIDOL (ISOVUE-370) INJECTION 76%
INTRAVENOUS | Status: AC
  Filled 2017-09-26: qty 100

## 2017-09-26 MED ORDER — LISINOPRIL 20 MG PO TABS
20.0000 mg | ORAL_TABLET | Freq: Every day | ORAL | Status: DC
  Administered 2017-09-27 – 2017-09-30 (×4): 20 mg via ORAL
  Filled 2017-09-26 (×4): qty 1

## 2017-09-26 MED ORDER — TERAZOSIN HCL 5 MG PO CAPS
10.0000 mg | ORAL_CAPSULE | Freq: Every day | ORAL | Status: DC
  Administered 2017-09-27 – 2017-09-29 (×4): 10 mg via ORAL
  Filled 2017-09-26 (×4): qty 2

## 2017-09-26 MED ORDER — ACETAMINOPHEN 500 MG PO TABS
1000.0000 mg | ORAL_TABLET | Freq: Once | ORAL | Status: AC
  Administered 2017-09-26: 1000 mg via ORAL
  Filled 2017-09-26: qty 2

## 2017-09-26 MED ORDER — ONDANSETRON HCL 4 MG/2ML IJ SOLN: 4.0000 mg | Freq: Four times a day (QID) | INTRAMUSCULAR | Status: DC | PRN

## 2017-09-26 MED ORDER — SODIUM CHLORIDE 0.9 % IV BOLUS: 30.0000 mL/kg | Freq: Once | INTRAVENOUS | Status: DC

## 2017-09-26 MED ORDER — WARFARIN SODIUM 7.5 MG PO TABS
7.5000 mg | ORAL_TABLET | Freq: Once | ORAL | Status: AC
  Administered 2017-09-27: 7.5 mg via ORAL
  Filled 2017-09-26: qty 1

## 2017-09-26 MED ORDER — SODIUM CHLORIDE 0.9 % IV SOLN
500.0000 mg | INTRAVENOUS | Status: DC
  Administered 2017-09-27 – 2017-09-28 (×2): 500 mg via INTRAVENOUS
  Filled 2017-09-26 (×3): qty 500

## 2017-09-26 MED ORDER — METOPROLOL TARTRATE 12.5 MG HALF TABLET
12.5000 mg | ORAL_TABLET | Freq: Every day | ORAL | Status: DC
  Administered 2017-09-27 – 2017-09-30 (×4): 12.5 mg via ORAL
  Filled 2017-09-26 (×4): qty 1

## 2017-09-26 MED ORDER — SODIUM CHLORIDE 0.9 % IV SOLN
1.0000 g | INTRAVENOUS | Status: DC
  Administered 2017-09-27 – 2017-09-30 (×4): 1 g via INTRAVENOUS
  Filled 2017-09-26 (×6): qty 10

## 2017-09-26 MED ORDER — ACETAMINOPHEN 325 MG PO TABS: 650.0000 mg | ORAL_TABLET | Freq: Four times a day (QID) | ORAL | Status: DC | PRN

## 2017-09-26 NOTE — ED Notes (Signed)
Transported to radiology per rad tech  

## 2017-09-26 NOTE — Progress Notes (Signed)
ANTICOAGULATION CONSULT NOTE - Initial Consult  Pharmacy Consult for coumadin Indication: atrial fibrillation  Allergies  Allergen Reactions  . Oxycodone Hcl Other (See Comments)    Reaction:  Somnolence   . Ultram [Tramadol Hcl] Other (See Comments)    Reaction:  Somnolence     Patient Measurements: Height: 6\' 1"  (185.4 cm) Weight: 198 lb (89.8 kg) IBW/kg (Calculated) : 79.9   Vital Signs: Temp: 101.1 F (38.4 C) (05/05 1448) Temp Source: Rectal (05/05 1448) BP: 139/99 (05/05 2030) Pulse Rate: 68 (05/05 2030)  Labs: Recent Labs    09/26/17 1437  HGB 10.9*  HCT 36.2*  PLT 127*  LABPROT 24.0*  INR 2.17  CREATININE 1.14  TROPONINI 0.04*    Estimated Creatinine Clearance: 44.8 mL/min (by C-G formula based on SCr of 1.14 mg/dL).   Medical History: Past Medical History:  Diagnosis Date  . AAA (abdominal aortic aneurysm) ([place]) 09/25/2009  . Atrial fibrillation ([place])   . BPH (benign prostatic hypertrophy)   . Cancer ([place])    skin cancer ( Basal cell)  . Chronic right hip pain   . Hyperlipidemia   . Hypertension   . Long term (current) use of anticoagulants   . Myocardial infarction [facility]) 1993    Medications:  Was on coumadin PTA, last dose 5/4 per med history  Assessment: 84 yo man to continue coumadin for afib.  INR is therapeutic on admission Goal of Therapy:  INR 2-3 Monitor platelets by anticoagulation protocol: Yes   Plan:  Coumadin 7.5 mg po tonight Daily PT/INR Monitor for bleeding complications  Kelbie Moro [place]et 09/26/2017,9:55 PM

## 2017-09-26 NOTE — ED Triage Notes (Signed)
BIB EMS for further eval of weakness x2 days - febrile

## 2017-09-26 NOTE — ED Notes (Signed)
Dr. Meliton Rattan notified of trop 0.04, no orders given

## 2017-09-26 NOTE — H&P (Signed)
History and Physical    [name]. BMW:[id number] DOB: 1921-01-14 DOA: 09/26/2017  PCP: Crist Infante, MD  Patient coming from: Home.  Chief Complaint: Fall weakness and shortness of breath.  HPI: Chad Buchanan is a 84 y.o. male with history of chronic diastolic CHF last EF measured in 2016 was 50 to 55%, paroxysmal atrial fibrillation, hypertension, BPH, hyperlipidemia, aortic aneurysm was brought to the ER after patient was found to be weak and had 2 falls while going to the bathroom.  Patient states he felt weak and had to sit down while going to the bathroom but did not hit his head or lose consciousness.  Has been feeling short of breath which is chronic but has mildly worsened denies any productive cough fever or chills.   ED Course: In the ER patient is found to be febrile with temperature of 101.1 F.  CT head was unremarkable CT angiogram of the chest shows pneumonia.  Patient remained hypoxic in the ER and has been admitted for further management of acute respiratory failure likely from pneumonia.  Patient denies any chest pain.  Patient's family states that patient's grandson who lives with him had influenza pneumonia last week.  Review of Systems: As per HPI, rest all negative.   Past Medical History:  Diagnosis Date  . AAA (abdominal aortic aneurysm) ([place]) 09/25/2009  . Atrial fibrillation ([place])   . BPH (benign prostatic hypertrophy)   . Cancer ([place])    skin cancer ( Basal cell)  . Chronic right hip pain   . Hyperlipidemia   . Hypertension   . Long term (current) use of anticoagulants   . Myocardial infarction ([place]) 1993    Past Surgical History:  Procedure Laterality Date  . ABDOMINAL AORTIC ANEURYSM REPAIR    . EYE SURGERY       reports that he quit smoking about 26 years ago. He has never used smokeless tobacco. He reports that he drinks alcohol. He reports that he does not use drugs.  Allergies  Allergen Reactions  . Oxycodone Hcl Other (See  Comments)    Reaction:  Somnolence   . Ultram [Tramadol Hcl] Other (See Comments)    Reaction:  Somnolence     Family History  Problem Relation Age of Onset  . Stroke Father   . Diabetes Brother     Prior to Admission medications   Medication Sig Start Date End Date Taking? Authorizing Provider  finasteride (PROSCAR) 5 MG tablet Take 5 mg by mouth at bedtime.    Yes [provider]  furosemide (LASIX) 40 MG tablet Take 1 tablet (40 mg total) by mouth 2 (two) times daily. Patient taking differently: Take 40 mg by mouth daily.  03/15/15  Yes Bonnielee Haff, MD  lisinopril (PRINIVIL,ZESTRIL) 20 MG tablet Take 20 mg by mouth daily.     Yes [provider]  metoprolol tartrate (LOPRESSOR) 25 MG tablet Take 12.5 mg by mouth daily.   Yes [provider]  simvastatin (ZOCOR) 40 MG tablet Take 20 mg by mouth at bedtime.    Yes [provider]  terazosin (HYTRIN) 10 MG capsule Take 10 mg by mouth at bedtime.    Yes [provider]  warfarin (COUMADIN) 5 MG tablet Take 5-7.5 mg by mouth every evening. Pt take  5 mg  tablet on Monday, Tues, and Friday.   Pt take  7.5 mg on Wednesday Thursday, Saturday, and Sunday   Yes Crist Infante, MD  Physical Exam: Vitals:   09/26/17 1630 09/26/17 1700 09/26/17 1900 09/26/17 2030  BP: 116/68 106/69 125/86 (!) 139/99  Pulse: 72 67 (!) 54 68  Resp: (!) 28 20 16 15   Temp:      TempSrc:      SpO2: 97% 97% 97% 94%  Weight:      Height:          Constitutional: Moderately built and nourished. Vitals:   09/26/17 1630 09/26/17 1700 09/26/17 1900 09/26/17 2030  BP: 116/68 106/69 125/86 (!) 139/99  Pulse: 72 67 (!) 54 68  Resp: (!) 28 20 16 15   Temp:      TempSrc:      SpO2: 97% 97% 97% 94%  Weight:      Height:       Eyes: Anicteric no pallor. ENMT: No discharge from the ears eyes nose or mouth. Neck: No JVD appreciated no mass felt. Respiratory: Bilateral expiratory wheezes no  crepitations. Cardiovascular: S1-S2 heard no murmurs appreciated. Abdomen: Soft nontender bowel sounds present. Musculoskeletal: No edema.  No joint effusion. Skin: No rash. Neurologic: Alert awake oriented to time place and person.  Moves all extremities. Psychiatric: Appears normal.  Normal affect.   Labs on Admission: I have personally reviewed following labs and imaging studies  CBC: Recent Labs  Lab 09/26/17 1437  WBC 4.9  NEUTROABS 4.1  HGB 10.9*  HCT 36.2*  MCV 88.1  PLT 867*   Basic Metabolic Panel: Recent Labs  Lab 09/26/17 1437  NA 136  K 4.6  CL 102  CO2 23  GLUCOSE 107*  BUN 20  CREATININE 1.14  CALCIUM 8.8*   GFR: Estimated Creatinine Clearance: 44.8 mL/min (by C-G formula based on SCr of 1.14 mg/dL). Liver Function Tests: Recent Labs  Lab 09/26/17 1437  AST 21  ALT 11*  ALKPHOS 60  BILITOT 1.0  PROT 6.4*  ALBUMIN 3.5   No results for input(s): LIPASE, AMYLASE in the last 168 hours. No results for input(s): AMMONIA in the last 168 hours. Coagulation Profile: Recent Labs  Lab 09/26/17 1437  INR 2.17   Cardiac Enzymes: Recent Labs  Lab 09/26/17 1437  TROPONINI 0.04*   BNP (last 3 results) No results for input(s): PROBNP in the last 8760 hours. HbA1C: No results for input(s): HGBA1C in the last 72 hours. CBG: No results for input(s): GLUCAP in the last 168 hours. Lipid Profile: No results for input(s): CHOL, HDL, LDLCALC, TRIG, CHOLHDL, LDLDIRECT in the last 72 hours. Thyroid Function Tests: No results for input(s): TSH, T4TOTAL, FREET4, T3FREE, THYROIDAB in the last 72 hours. Anemia Panel: No results for input(s): VITAMINB12, FOLATE, FERRITIN, TIBC, IRON, RETICCTPCT in the last 72 hours. Urine analysis:    Component Value Date/Time   COLORURINE YELLOW 09/26/2017 1702   APPEARANCEUR HAZY (A) 09/26/2017 1702   LABSPEC 1.011 09/26/2017 1702   PHURINE 5.0 09/26/2017 1702   GLUCOSEU NEGATIVE 09/26/2017 1702   HGBUR SMALL (A)  09/26/2017 1702   BILIRUBINUR NEGATIVE 09/26/2017 1702   KETONESUR NEGATIVE 09/26/2017 1702   PROTEINUR NEGATIVE 09/26/2017 1702   UROBILINOGEN 1.0 11/19/2011 1459   NITRITE NEGATIVE 09/26/2017 1702   LEUKOCYTESUR MODERATE (A) 09/26/2017 1702   Sepsis Labs: @LABRCNTIP (procalcitonin:4,lacticidven:4) )No results found for this or any previous visit (from the past 240 hour(s)).   Radiological Exams on Admission: Ct Head Wo Contrast  Result Date: 09/26/2017 CLINICAL DATA:  Increased weakness for 2 days. Shortness of breath. Febrile. EXAM: CT HEAD WITHOUT CONTRAST TECHNIQUE: Contiguous axial images  were obtained from the base of the skull through the vertex without intravenous contrast. COMPARISON:  None. FINDINGS: Brain: No evidence of acute infarction, hemorrhage, hydrocephalus, extra-axial collection or mass lesion/mass effect. S phone lesion in the right cerebellar cortex, likely due to prior ischemic infarction. Advanced brain parenchymal volume loss and deep white matter microangiopathy. Vascular: Calcific atherosclerotic disease at the skull base. Skull: Normal. Negative for fracture or focal lesion. Sinuses/Orbits: Mild polypoid mucosal thickening of bilateral ethmoid, frontal and right maxillary sinuses. Other: None. IMPRESSION: No acute intracranial abnormality. Advanced brain parenchymal atrophy and chronic microvascular disease. Remote right cerebellar ischemic infarct. Chronic sinusitis. Electronically Signed   By: Fidela Salisbury M.D.   On: 09/26/2017 18:16   Ct Angio Chest Pe W/cm &/or Wo Cm  Result Date: 09/26/2017 CLINICAL DATA:  Increased weakness x2 days. Patient fell today. Fever and dyspnea. EXAM: CT ANGIOGRAPHY CHEST WITH CONTRAST TECHNIQUE: Multidetector CT imaging of the chest was performed using the standard protocol during bolus administration of intravenous contrast. Multiplanar CT image reconstructions and MIPs were obtained to evaluate the vascular anatomy. CONTRAST:   174mL ISOVUE-370 IOPAMIDOL (ISOVUE-370) INJECTION 76% COMPARISON:  Same day CXR and 03/11/2015 CXR FINDINGS: Cardiovascular: No large central pulmonary embolus allowing for motion related artifacts limiting assessment of the more distal segmental pulmonary arteries. Satisfactory opacification of the pulmonary arteries to the proximal segmental levels. There is cardiomegaly with left main and three-vessel coronary arteriosclerosis. Moderate aortic atherosclerosis with 4.6 cm aneurysmal dilatation of the ascending aorta. No definite evidence of dissection though there is preferential opacification of the pulmonary arteries on this study. Mediastinum/Nodes: Small subcentimeter and bilateral hilar lymph nodes likely reactive. Trachea mainstem bronchi are patent. No acute esophageal abnormality. No thyromegaly or mass. Lungs/Pleura: Limited by motion artifacts. Faint bilateral airspace opacity superimposed on subpleural chronic interstitial lung disease and fibrosis is identified, multilobar a more confluent in the right upper and lower lobes. Trace bilateral pleural effusions. No identified dominant mass. Upper Abdomen: Bilateral upper pole water attenuating cysts of both kidneys, largest approximately 6.4 cm medially in the right upper pole. The unenhanced liver is unremarkable. There is no splenomegaly. No acute abnormality within the upper abdomen. Musculoskeletal: No aggressive osseous lesions. Thoracic spondylosis. Review of the MIP images confirms the above findings. IMPRESSION: 1. Chronic interstitial lung disease with bilateral subpleural areas of reticular fibrosis with superimposed bilateral multilobar airspace opacities predominantly right upper and lower lobes suggesting superimposed infection/pneumonia. Small reactive lymph nodes the mediastinum and hila. Trace bilateral pleural effusions. 2. No large central pulmonary embolus given limitations of respiratory motion artifact. 3. Aortic atherosclerosis and  coronary arteriosclerosis. 4. 4.6 cm ascending thoracic aortic aneurysm. No dissection. Ascending thoracic aortic aneurysm. Recommend semi-annual imaging followup by CTA or MRA and referral to cardiothoracic surgery if not already obtained. This recommendation follows 2010 ACCF/AHA/AATS/ACR/ASA/SCA/SCAI/SIR/STS/SVM Guidelines for the Diagnosis and Management of Patients With Thoracic Aortic Disease. Circulation. 2010; 121: e266-e369 5. Multilevel degenerative disc and endplate changes compatible thoracic spondylosis. No aggressive osseous lesions. Aortic aneurysm NOS (ICD10-I71.9). Aortic Atherosclerosis (ICD10-I70.0). Electronically Signed   By: Ashley Royalty M.D.   On: 09/26/2017 18:39   Dg Chest Port 1 View  Result Date: 09/26/2017 CLINICAL DATA:  Weakness EXAM: PORTABLE CHEST 1 VIEW COMPARISON:  03/11/2015 chest radiograph. FINDINGS: Stable cardiomediastinal silhouette with mild cardiomegaly. No pneumothorax. No pleural effusion. Patchy reticular opacities throughout both lungs, not appreciably changed. No superimposed acute consolidative airspace disease. IMPRESSION: 1. No appreciable change in patchy reticular opacities throughout both lungs, which may  indicate chronic interstitial lung disease. 2. No acute superimposed consolidative airspace disease. 3. Stable mild cardiomegaly. Electronically Signed   By: Ilona Sorrel M.D.   On: 09/26/2017 15:26    EKG: Independently reviewed.  A. fib rate controlled.  Assessment/Plan Principal Problem:   CAP (community acquired pneumonia) Active Problems:   Atrial fibrillation, chronic (HCC)   Essential hypertension   AAA (abdominal aortic aneurysm) without rupture (HCC)   Acute respiratory failure with hypoxia (HCC)   Chronic diastolic CHF (congestive heart failure) ([place])    1. Acute respiratory failure with hypoxia likely from community-acquired pneumonia -patient is placed on ceftriaxone and Zithromax.  Check influenza PCR since patient's grandson also  had influenza recently.  Will check sputum for culture urine for Legionella and strep antigen follow cultures.  Patient's acute respiratory failure could also be from interstitial lung disease and also CHF contributing.  Patient is also wheezing is placed on nebulizer treatment.  If continues to wheeze may try steroids and Pulmicort.  Since troponin is mildly elevated we will cycle cardiac markers.  Denies any chest pain. 2. Chronic diastolic CHF last EF measured in 2016 was 50 to 55% -on Lasix. 3. Hypertension on lisinopril and metoprolol. 4. Paroxysmal atrial fibrillation presently in A. fib.  Rate controlled.  On metoprolol and warfarin Coumadin. 5. Chronic normocytic normochromic anemia appears to be at baseline.  Follow CBC. 6. Chronic kidney disease stage II appears to be at baseline.  Follow metabolic panel. 7. Aortic aneurysm denies any pain. 8. Fall likely from deconditioning -physical therapy and social work consult. 9. Possible UTI follow urine cultures on ceftriaxone.   DVT prophylaxis: Coumadin. Code Status: DNR. Family Communication: Patient's family. Disposition Plan: To be determined. Consults called: Physical therapy and Education officer, museum. Admission status: Inpatient.   Rise Patience MD [facility] Pager [phone].  If 7PM-7AM, please contact night-coverage [email or web address] Password [email or web address]  09/26/2017, 9:52 PM

## 2017-09-26 NOTE — ED Provider Notes (Signed)
[facility] EMERGENCY DEPARTMENT Provider Note   CSN: [id number] Arrival date & time: 09/26/17  1359     History   Chief Complaint Chief Complaint  Patient presents with  . Shortness of Breath    HPI Chad Buchanan. is a 84 y.o. male.  HPI Patient reportedly had 2 falls today.  Although he has advanced age, patient typically has very good mental status.  He lives with his wife independently.  She has dementia.  His grandson also lives with them.  Patient reports he is here because his family members made him come.  From his perspective, he does not really have any complaints.  He reports he did fall a couple of times but denies any pain or injury.  He denies positives on review of systems.  His sister is here, she reports he does frequently have cough and labored breathing.  This is fairly chronic.  She reports he has had some swelling in the legs.  She reports that he probably cut his Lasix in half about a year ago.  She reports that the patient says his doctor advised that was okay but she thinks he did of his own accord.  Patient reports this morning he ate a honey bun and some orange juice at breakfast time and watermelon at lunchtime.  He denies chest pain, abdominal pain, headache.  Patient sister reports that although his mental status is very clear, he is very hard headed and not amenable to pursuing much routine medical care or additional in-home care or  placement. Past Medical History:  Diagnosis Date  . AAA (abdominal aortic aneurysm) ([place]) 09/25/2009  . Atrial fibrillation ([place])   . BPH (benign prostatic hypertrophy)   . Cancer ([place])    skin cancer ( [place])  . Chronic right hip pain   . Hyperlipidemia   . Hypertension   . Long term (current) use of anticoagulants   . Myocardial infarction [facility]) 1993    Patient Active Problem List   Diagnosis Date Noted  . Acute diastolic CHF (congestive heart failure) ([place]) 03/12/2015  . Acute respiratory  failure with hypoxia ([place]) 03/11/2015  . Acute congestive heart failure ([place]) 03/11/2015  . Candidal dermatitis 03/11/2015  . Conjunctivitis 03/11/2015  . Fever and chills 05/02/2011  . Hypoxia 05/02/2011  . Atrial fibrillation ([place]) 05/02/2011  . Coronary atherosclerosis 05/02/2011  . Impaired fasting glucose 05/02/2011  . Unspecified hearing loss 05/02/2011  . Essential hypertension 05/02/2011  . Right hip pain 05/02/2011  . AAA (abdominal aortic aneurysm) without rupture ([place]) 05/02/2011  . Other and unspecified hyperlipidemia 05/02/2011  . Hypokalemia 05/02/2011  . Abdominal aneurysm without mention of rupture 03/25/2011    Past Surgical History:  Procedure Laterality Date  . ABDOMINAL AORTIC ANEURYSM REPAIR    . EYE SURGERY          Home Medications    Prior to Admission medications   Medication Sig Start Date End Date Taking? Authorizing Provider  finasteride (PROSCAR) 5 MG tablet Take 5 mg by mouth at bedtime.    Yes [provider]  furosemide (LASIX) 40 MG tablet Take 1 tablet (40 mg total) by mouth 2 (two) times daily. Patient taking differently: Take 40 mg by mouth daily.  03/15/15  Yes Bonnielee Haff, MD  lisinopril (PRINIVIL,ZESTRIL) 20 MG tablet Take 20 mg by mouth daily.     Yes [provider]  metoprolol tartrate (LOPRESSOR) 25 MG tablet Take 12.5 mg by mouth daily.  Yes [provider]  simvastatin (ZOCOR) 40 MG tablet Take 20 mg by mouth at bedtime.    Yes [provider]  terazosin (HYTRIN) 10 MG capsule Take 10 mg by mouth at bedtime.    Yes [provider]  warfarin (COUMADIN) 5 MG tablet Take 5-7.5 mg by mouth every evening. Pt take  5 mg  tablet on Monday, Tues, and Friday.   Pt take  7.5 mg on Wednesday Thursday, Saturday, and Sunday   Yes Crist Infante, MD    Family History Family History  Problem Relation Age of Onset  . Stroke Father   . Diabetes Brother     Social History Social History    Tobacco Use  . Smoking status: Former Smoker    Last attempt to quit: 07/30/1991    Years since quitting: 26.1  . Smokeless tobacco: Never Used  Substance Use Topics  . Alcohol use: Yes    Comment: once per week   . Drug use: No     Allergies   Oxycodone hcl and Ultram [tramadol hcl]   Review of Systems Review of Systems 10 Systems reviewed and are negative for acute change except as noted in the HPI.  Physical Exam Updated Vital Signs BP (!) 139/99   Pulse 68   Temp (!) 101.1 F (38.4 C) (Rectal)   Resp 15   Ht 6\' 1"  (1.854 m)   Wt 89.8 kg (198 lb)   SpO2 94%   BMI 26.12 kg/m   Physical Exam  Constitutional: He is oriented to person, place, and time.  Patient is alert and appropriate.  Is answering questions appropriately.  Mild to moderate increased work of breathing.  HENT:  Head: Normocephalic and atraumatic.  Mucous membranes are pink and moist.  Posterior oropharynx is widely patent.  Patient does have some very poor dentition in the lower inferior incisors with decay and gingivitis.  No associated facial swelling.  Eyes:  Ectropion of the right eye.  Ocular motions intact.  Neck: Neck supple.  Cardiovascular:  Normal rate.  Occasional ectopic beat.  2 out of 6 systolic ejection murmur.  Pulmonary/Chest:  She has mild to moderate increased work of breathing.  With deep inspiration harsh cough.  Very poor airflow on the left.  Expiratory wheeze mid lung fields and on the right.  Rhonchi clears with cough.  Abdominal: Soft. He exhibits no distension. There is no tenderness. There is no guarding.  Genitourinary:  Genitourinary Comments: Mild intertriginous candidal rash.  No significant maceration.  No diffuse extension over scrotum.  Mild beneath penis.  Scrotum is moderately enlarged.  Nontender without crepitus.  Musculoskeletal:  1-2+ pitting edema bilateral lower extremities.  Calves soft nontender.  No wounds or cellulitis. I can elevate both lower  extremities off of the bed and flex at the hip without pain at the hip or the knee.  No ingestion of pain with range of motion.  She is using both upper extremities symmetrically without pain.  No deformities.  Neurological: He is alert and oriented to person, place, and time. No cranial nerve deficit. He exhibits normal muscle tone. Coordination normal.  Skin: Skin is warm and dry.  Psychiatric: He has a normal mood and affect.     ED Treatments / Results  Labs (all labs ordered are listed, but only abnormal results are displayed) Labs Reviewed  COMPREHENSIVE METABOLIC PANEL - Abnormal; Notable for the following components:      Result Value   Glucose, Bld  107 (*)    Calcium 8.8 (*)    Total Protein 6.4 (*)    ALT 11 (*)    GFR calc non Af Amer 53 (*)    All other components within normal limits  CBC WITH DIFFERENTIAL/PLATELET - Abnormal; Notable for the following components:   RBC 4.11 (*)    Hemoglobin 10.9 (*)    HCT 36.2 (*)    RDW 16.1 (*)    Platelets 127 (*)    Lymphs Abs 0.5 (*)    All other components within normal limits  URINALYSIS, ROUTINE W REFLEX MICROSCOPIC - Abnormal; Notable for the following components:   APPearance HAZY (*)    Hgb urine dipstick SMALL (*)    Leukocytes, UA MODERATE (*)    Bacteria, UA RARE (*)    All other components within normal limits  BRAIN NATRIURETIC PEPTIDE - Abnormal; Notable for the following components:   B Natriuretic Peptide 287.0 (*)    All other components within normal limits  TROPONIN I - Abnormal; Notable for the following components:   Troponin I 0.04 (*)    All other components within normal limits  PROTIME-INR - Abnormal; Notable for the following components:   Prothrombin Time 24.0 (*)    All other components within normal limits  CULTURE, BLOOD (ROUTINE X 2)  CULTURE, BLOOD (ROUTINE X 2)  URINE CULTURE  I-STAT CG4 LACTIC ACID, ED  I-STAT CG4 LACTIC ACID, ED    EKG EKG Interpretation  Date/Time:  Sunday Sep 26 2017 14:23:21 EDT Ventricular Rate:  72 PR Interval:    QRS Duration: 104 QT Interval:  409 QTC Calculation: 448 R Axis:   -72 Text Interpretation:  Atrial fibrillation LAD, consider left anterior fascicular block agree. no change Confirmed by Charlesetta Shanks [phone]) on 09/26/2017 3:27:35 PM Also confirmed by Charlesetta Shanks [phone]), editor Abelardo Diesel [phone])  on 09/26/2017 3:42:41 PM   Radiology Ct Head Wo Contrast  Result Date: 09/26/2017 CLINICAL DATA:  Increased weakness for 2 days. Shortness of breath. Febrile. EXAM: CT HEAD WITHOUT CONTRAST TECHNIQUE: Contiguous axial images were obtained from the base of the skull through the vertex without intravenous contrast. COMPARISON:  None. FINDINGS: Brain: No evidence of acute infarction, hemorrhage, hydrocephalus, extra-axial collection or mass lesion/mass effect. S phone lesion in the right cerebellar cortex, likely due to prior ischemic infarction. Advanced brain parenchymal volume loss and deep white matter microangiopathy. Vascular: Calcific atherosclerotic disease at the skull base. Skull: Normal. Negative for fracture or focal lesion. Sinuses/Orbits: Mild polypoid mucosal thickening of bilateral ethmoid, frontal and right maxillary sinuses. Other: None. IMPRESSION: No acute intracranial abnormality. Advanced brain parenchymal atrophy and chronic microvascular disease. Remote right cerebellar ischemic infarct. Chronic sinusitis. Electronically Signed   By: Fidela Salisbury M.D.   On: 09/26/2017 18:16   Ct Angio Chest Pe W/cm &/or Wo Cm  Result Date: 09/26/2017 CLINICAL DATA:  Increased weakness x2 days. Patient fell today. Fever and dyspnea. EXAM: CT ANGIOGRAPHY CHEST WITH CONTRAST TECHNIQUE: Multidetector CT imaging of the chest was performed using the standard protocol during bolus administration of intravenous contrast. Multiplanar CT image reconstructions and MIPs were obtained to evaluate the vascular anatomy. CONTRAST:  171mL  ISOVUE-370 IOPAMIDOL (ISOVUE-370) INJECTION 76% COMPARISON:  Same day CXR and 03/11/2015 CXR FINDINGS: Cardiovascular: No large central pulmonary embolus allowing for motion related artifacts limiting assessment of the more distal segmental pulmonary arteries. Satisfactory opacification of the pulmonary arteries to the proximal segmental levels. There is cardiomegaly with left main and  three-vessel coronary arteriosclerosis. Moderate aortic atherosclerosis with 4.6 cm aneurysmal dilatation of the ascending aorta. No definite evidence of dissection though there is preferential opacification of the pulmonary arteries on this study. Mediastinum/Nodes: Small subcentimeter and bilateral hilar lymph nodes likely reactive. Trachea mainstem bronchi are patent. No acute esophageal abnormality. No thyromegaly or mass. Lungs/Pleura: Limited by motion artifacts. Faint bilateral airspace opacity superimposed on subpleural chronic interstitial lung disease and fibrosis is identified, multilobar a more confluent in the right upper and lower lobes. Trace bilateral pleural effusions. No identified dominant mass. Upper Abdomen: Bilateral upper pole water attenuating cysts of both kidneys, largest approximately 6.4 cm medially in the right upper pole. The unenhanced liver is unremarkable. There is no splenomegaly. No acute abnormality within the upper abdomen. Musculoskeletal: No aggressive osseous lesions. Thoracic spondylosis. Review of the MIP images confirms the above findings. IMPRESSION: 1. Chronic interstitial lung disease with bilateral subpleural areas of reticular fibrosis with superimposed bilateral multilobar airspace opacities predominantly right upper and lower lobes suggesting superimposed infection/pneumonia. Small reactive lymph nodes the mediastinum and hila. Trace bilateral pleural effusions. 2. No large central pulmonary embolus given limitations of respiratory motion artifact. 3. Aortic atherosclerosis and  coronary arteriosclerosis. 4. 4.6 cm ascending thoracic aortic aneurysm. No dissection. Ascending thoracic aortic aneurysm. Recommend semi-annual imaging followup by CTA or MRA and referral to cardiothoracic surgery if not already obtained. This recommendation follows 2010 ACCF/AHA/AATS/ACR/ASA/SCA/SCAI/SIR/STS/SVM Guidelines for the Diagnosis and Management of Patients With Thoracic Aortic Disease. Circulation. 2010; 121: e266-e369 5. Multilevel degenerative disc and endplate changes compatible thoracic spondylosis. No aggressive osseous lesions. Aortic aneurysm NOS (ICD10-I71.9). Aortic Atherosclerosis (ICD10-I70.0). Electronically Signed   By: Ashley Royalty M.D.   On: 09/26/2017 18:39   Dg Chest Port 1 View  Result Date: 09/26/2017 CLINICAL DATA:  Weakness EXAM: PORTABLE CHEST 1 VIEW COMPARISON:  03/11/2015 chest radiograph. FINDINGS: Stable cardiomediastinal silhouette with mild cardiomegaly. No pneumothorax. No pleural effusion. Patchy reticular opacities throughout both lungs, not appreciably changed. No superimposed acute consolidative airspace disease. IMPRESSION: 1. No appreciable change in patchy reticular opacities throughout both lungs, which may indicate chronic interstitial lung disease. 2. No acute superimposed consolidative airspace disease. 3. Stable mild cardiomegaly. Electronically Signed   By: Ilona Sorrel M.D.   On: 09/26/2017 15:26    Procedures Procedures (including critical care time)  Medications Ordered in ED Medications  iopamidol (ISOVUE-370) 76 % injection (has no administration in time range)  cefTRIAXone (ROCEPHIN) 1 g in sodium chloride 0.9 % 100 mL IVPB (0 g Intravenous Stopped 09/26/17 1629)  azithromycin (ZITHROMAX) 500 mg in sodium chloride 0.9 % 250 mL IVPB (0 mg Intravenous Stopped 09/26/17 1814)  sodium chloride 0.9 % bolus 1,000 mL (0 mLs Intravenous Stopped 09/26/17 1742)  acetaminophen (TYLENOL) tablet 1,000 mg (1,000 mg Oral Given 09/26/17 1551)  iopamidol  (ISOVUE-370) 76 % injection 100 mL (100 mLs Intravenous Contrast Given 09/26/17 1744)     Initial Impression / Assessment and Plan / ED Course  I have reviewed the triage vital signs and the nursing notes.  Pertinent labs & imaging results that were available during my care of the patient were reviewed by me and considered in my medical decision making (see chart for details).     Consult: Dr. Hal Hope for admission.  Final Clinical Impressions(s) / ED Diagnoses   Final diagnoses:  Community acquired pneumonia, unspecified laterality  Generalized weakness  Patient presents with weakness today.  He had 2 falls in the home.  He denies pain.  CT head  shows no intracranial bleeding or injury.  His cognitive function is very good for age.  Presents with dyspnea, cough and fall.  Consideration was for ACS.  At this time patient does not have acute ischemic changes on EKG.  Troponin at 0.04.  Patient denies active chest pain.  CT PE study does not show pulmonary embolus but bilateral infectious process.  Plan will be for admission for IV antibiotics and continued monitoring for general weakness.  ED Discharge Orders    None       Charlesetta Shanks, MD 09/26/17 2123

## 2017-09-27 ENCOUNTER — Other Ambulatory Visit: Payer: Self-pay

## 2017-09-27 DIAGNOSIS — R531 Weakness: Secondary | ICD-10-CM

## 2017-09-27 DIAGNOSIS — I10 Essential (primary) hypertension: Secondary | ICD-10-CM

## 2017-09-27 DIAGNOSIS — I714 Abdominal aortic aneurysm, without rupture: Secondary | ICD-10-CM

## 2017-09-27 LAB — CBC
HEMATOCRIT: 33.2 % — AB (ref 39.0–52.0)
Hemoglobin: 10.3 g/dL — ABNORMAL LOW (ref 13.0–17.0)
MCH: 27.4 pg (ref 26.0–34.0)
MCHC: 31 g/dL (ref 30.0–36.0)
MCV: 88.3 fL (ref 78.0–100.0)
Platelets: 97 10*3/uL — ABNORMAL LOW (ref 150–400)
RBC: 3.76 MIL/uL — ABNORMAL LOW (ref 4.22–5.81)
RDW: 16.4 % — AB (ref 11.5–15.5)
WBC: 3.9 10*3/uL — ABNORMAL LOW (ref 4.0–10.5)

## 2017-09-27 LAB — BASIC METABOLIC PANEL
Anion gap: 11 (ref 5–15)
BUN: 20 mg/dL (ref 6–20)
CHLORIDE: 103 mmol/L (ref 101–111)
CO2: 23 mmol/L (ref 22–32)
Calcium: 8 mg/dL — ABNORMAL LOW (ref 8.9–10.3)
Creatinine, Ser: 1.04 mg/dL (ref 0.61–1.24)
GFR calc Af Amer: >60 mL/min (ref >60)
GFR calc non Af Amer: 59 mL/min — ABNORMAL LOW (ref >60)
GLUCOSE: 90 mg/dL (ref 65–99)
POTASSIUM: 4 mmol/L (ref 3.5–5.1)
Sodium: 137 mmol/L (ref 135–145)

## 2017-09-27 LAB — MRSA PCR SCREENING: MRSA BY PCR: NEGATIVE

## 2017-09-27 LAB — PROTIME-INR
INR: 2.24
Prothrombin Time: 24.6 seconds — ABNORMAL HIGH (ref 11.4–15.2)

## 2017-09-27 LAB — INFLUENZA PANEL BY PCR (TYPE A & B)
Influenza A By PCR: POSITIVE — AB
Influenza B By PCR: NEGATIVE

## 2017-09-27 LAB — TROPONIN I
TROPONIN I: 0.03 ng/mL — AB (ref <0.03)
Troponin I: 0.12 ng/mL (ref <0.03)

## 2017-09-27 LAB — STREP PNEUMONIAE URINARY ANTIGEN: Strep Pneumo Urinary Antigen: NEGATIVE

## 2017-09-27 MED ORDER — ALBUTEROL SULFATE (2.5 MG/3ML) 0.083% IN NEBU
2.5000 mg | INHALATION_SOLUTION | RESPIRATORY_TRACT | Status: DC | PRN
  Administered 2017-09-27: 2.5 mg via RESPIRATORY_TRACT
  Filled 2017-09-27: qty 3

## 2017-09-27 MED ORDER — IPRATROPIUM-ALBUTEROL 0.5-2.5 (3) MG/3ML IN SOLN
3.0000 mL | Freq: Four times a day (QID) | RESPIRATORY_TRACT | Status: DC
Start: 2017-09-27 — End: 2017-09-28
  Administered 2017-09-27 (×3): 3 mL via RESPIRATORY_TRACT
  Filled 2017-09-27 (×3): qty 3

## 2017-09-27 MED ORDER — WARFARIN SODIUM 5 MG PO TABS
5.0000 mg | ORAL_TABLET | Freq: Once | ORAL | Status: AC
  Administered 2017-09-27: 5 mg via ORAL
  Filled 2017-09-27: qty 1

## 2017-09-27 MED ORDER — ALBUTEROL SULFATE (2.5 MG/3ML) 0.083% IN NEBU
2.5000 mg | INHALATION_SOLUTION | RESPIRATORY_TRACT | Status: DC
  Administered 2017-09-27 (×2): 2.5 mg via RESPIRATORY_TRACT
  Filled 2017-09-27 (×2): qty 3

## 2017-09-27 MED ORDER — OSELTAMIVIR PHOSPHATE 30 MG PO CAPS
30.0000 mg | ORAL_CAPSULE | Freq: Two times a day (BID) | ORAL | Status: DC
  Administered 2017-09-27 – 2017-09-30 (×8): 30 mg via ORAL
  Filled 2017-09-27 (×9): qty 1

## 2017-09-27 MED ORDER — ORAL CARE MOUTH RINSE: 15.0000 mL | Freq: Two times a day (BID) | OROMUCOSAL | Status: DC

## 2017-09-27 NOTE — Clinical Social Work Placement (Signed)
   CLINICAL SOCIAL WORK PLACEMENT  NOTE  Date:  09/27/2017  Patient Details  Name: Chad Buchanan. MRN: [id number] Date of Birth: [birth year]  Clinical Social Work is seeking post-discharge placement for this patient at the [place] level of care (*CSW will initial, date and re-position this form in  chart as items are completed):  Yes   Patient/family provided with [place] Work Department's list of facilities offering this level of care within the geographic area requested by the patient (or if unable, by the patient's family).  Yes   Patient/family informed of their freedom to choose among providers that offer the needed level of care, that participate in Medicare, Medicaid or managed care program needed by the patient, have an available bed and are willing to accept the patient.  Yes   Patient/family informed of [facility]'s ownership interest in [facility] and [facility], as well as of the fact that they are under no obligation to receive care at these facilities.  PASRR submitted to EDS on 09/27/17     PASRR number received on       Existing PASRR number confirmed on 09/27/17     FL2 transmitted to all facilities in geographic area requested by pt/family on 09/27/17     FL2 transmitted to all facilities within larger geographic area on       Patient informed that his/her managed care company has contracts with or will negotiate with certain facilities, including the following:            Patient/family informed of bed offers received.  Patient chooses bed at       Physician recommends and patient chooses bed at      Patient to be transferred to   on  .  Patient to be transferred to facility by       Patient family notified on   of transfer.  Name of family member notified:        PHYSICIAN       Additional Comment:    _______________________________________________ Candie Chroman, LCSW 09/27/2017, 5:49 PM

## 2017-09-27 NOTE — Plan of Care (Signed)
Continue current care plan 

## 2017-09-27 NOTE — Clinical Social Work Note (Signed)
Clinical Social Work Assessment  Patient Details  Name: Chad Buchanan. MRN: [id number] Date of Birth: 1920-11-17  Date of referral:  09/27/17               Reason for consult:  Facility Placement, Discharge Planning                Permission sought to share information with:  Facility Sport and exercise psychologist, Family Supports Permission granted to share information::  Yes, Verbal Permission Granted  Name::     Roseanne Reno  Agency::  SNF's  Relationship::  Daughter-in-law  Contact Information:     Housing/Transportation Living arrangements for the past 2 months:  [place] of Information:  Patient, Medical Team, Other (Comment Required)(Daughter-in-law) Patient Interpreter Needed:  None, [place] Criminal Activity/Legal Involvement Pertinent to Current Situation/Hospitalization:    Significant Relationships:  Adult Children, Spouse, Other Family Members Lives with:  Spouse, Other (Comment)(Grandson and his girlfriend) Do you feel safe going back to the place where you live?  Yes Need for family participation in patient care:  Yes (Comment)  Care giving concerns:  PT recommending SNF placement once medically stable for discharge.   Social Worker assessment / plan:  CSW met with patient. Daughter-in-law at bedside. CSW introduced role and explained that PT recommendations would be discussed. Patient does not want SNF placement but is agreeable to home health. He last went to a SNF in 2017 ([facility]) for three weeks. Discussed home health vs. SNF. Patient's daughter-in-law stated patient lives home with his wife and his grandson and his girlfriend live in the basement apartment. Patient's grandson and girlfriend have the flu. Patient has 4 children and 6 step-children and they are all (except wife, grandson, and girlfriend) going to a [employer] for another of the patient's daughter-in-laws at [facility] this morning. CSW, patient, and daughter-in-law  discussed concerns with patient not having adequate help at home while they are gone from Friday-Sunday. If they are not able to arrange adequate help at home for this weekend, patient might be agreeable to SNF. Reviewed SNF list and picked out preferences: [facility], [place], [facility], [facility], [facility], and [facility]. Patient is agreeable to CSW sending out referral now in case he needs SNF. No further concerns. CSW encouraged patient and his daughter-in-law to contact CSW as needed. CSW will continue to follow patient and his family for support and facilitate discharge to SNF, if agreeable, once medically stable.  Employment status:  Retired Nurse, adult PT Recommendations:  [place] / Referral to community resources:  [place]  Patient/Family's Response to care:  Patient prefers home health. Patient's family supportive and involved in patient's care. Patient and his daughter-in-law appreciated social work intervention.  Patient/Family's Understanding of and Emotional Response to Diagnosis, Current Treatment, and Prognosis:  Patient and his daughter-in-law have a good understanding of the reason for admission and PT recommendations for SNF. Patient and his daughter-in-law appear happy with hospital care. Patient's daughter-in-law does want to talk to patient experience about some concerns in the ED. Unit assistant director will consult them.  Emotional Assessment Appearance:  Appears stated age Attitude/Demeanor/Rapport:  Engaged Affect (typically observed):  Appropriate, Calm, Pleasant Orientation:  Oriented to Self, Oriented to Place, Oriented to  Time, Oriented to Situation Alcohol / Substance use:  Never Used Psych involvement (Current and /or in the community):  No (Comment)  Discharge Needs  Concerns to be addressed:  Care Coordination Readmission within the last 30 days:   Yes Current discharge risk:  Dependent with Mobility Barriers to Discharge:  Continued Medical Work up, Insurance Authorization    C , LCSW 09/27/2017, 5:42 PM  

## 2017-09-27 NOTE — Progress Notes (Signed)
PROGRESS NOTE  Chad Buchanan. FVC:[id number] DOB: 11/27/1920 DOA: 09/26/2017 PCP: Crist Infante, MD  HPI/Recap of past 24 hours: Chad Buchanan. is a 84 y.o. male with history of chronic diastolic CHF last EF measured in 2016 was 50 to 55%, paroxysmal atrial fibrillation, hypertension, BPH, hyperlipidemia, aortic aneurysm was brought to the ER after patient was found to be weak and had 2 falls while going to the bathroom.  Patient states he felt weak and had to sit down while going to the bathroom but did not hit his head or lose consciousness.  Has been feeling short of breath which is chronic but has mildly worsened denies any productive cough fever or chills.   ED Course: In the ER patient is found to be febrile with temperature of 101.1 F.  CT head was unremarkable CT angiogram of the chest shows pneumonia.  Patient remained hypoxic in the ER and has been admitted for further management of acute respiratory failure likely from pneumonia.  Patient denies any chest pain.  Patient's family states that patient's grandson who lives with him had influenza pneumonia last week.   09/27/17: Patient seen and examined at his bedside.  He has no new complaints.  Assessment/Plan: Principal Problem:   CAP (community acquired pneumonia) Active Problems:   Atrial fibrillation, chronic (HCC)   Essential hypertension   AAA (abdominal aortic aneurysm) without rupture (HCC)   Acute respiratory failure with hypoxia (HCC)   Chronic diastolic CHF (congestive heart failure) (HCC)   Acute hypoxic respiratory failure secondary to community-acquired pneumonia versus influenza A Sputum cultures pending Continue Tamiflu Continue IV azithromycin and IV ceftriaxone empirically while awaiting for the results of sputum culture Continue duo nebs every 6 hours and every 2 hours as needed Continue O2 supplementation to maintain O2 saturation 92% or greater  Chronic diastolic CHF Last 2D echo revealed  preserved LV EF 50 to 55% Continue Lasix Continue strict I's and O's and daily weight  Hypertension blood pressure stable Continue home medications  Paroxysmal A. fib Rate controlled on metoprolol Continue Coumadin INR is Therapeutic 2.24 Pharmacy monitoring Coumadin dosing  Chronic normocytic anemia Hemoglobin stable CBC in the morning  CKD 2 At his baseline, stable  Aortic aneurysm Denies any chest pain Continue monitoring  Ambulatory dysfunction/physical debility PT assessed and recommended SNF Fall precautions   Code Status: DNR  Family Communication: Daughter on phone  Disposition Plan: SNF when clinically stable   Consultants:  None  Procedures:  None  Antimicrobials:  IV ceftriaxone   IV azithromycin   Tamiflu  DVT prophylaxis: coumadin   Objective: Vitals:   09/27/17 0753 09/27/17 0937 09/27/17 0939 09/27/17 1147  BP: 119/63 119/63 134/62 (!) 101/50  Pulse: 75 77 (!) 107   Resp: 20   (!) 25  Temp: 98.1 F (36.7 C)   97.9 F (36.6 C)  TempSrc: Oral   Oral  SpO2: 97% 96% (!) 77%   Weight:      Height:        Intake/Output Summary (Last 24 hours) at 09/27/2017 1352 Last data filed at 09/27/2017 0900 Gross per 24 hour  Intake -  Output 440 ml  Net -440 ml   Filed Weights   09/26/17 1600 09/27/17 0230  Weight: 89.8 kg (198 lb) 90 kg (198 lb 6.6 oz)    Exam:  . General: 84 y.o. year-old male well developed well nourished in no acute distress.  Alert and oriented x3. . Cardiovascular: Regular rate and  rhythm with no rubs or gallops.  No thyromegaly or JVD noted.   Marland Kitchen Respiratory: Diffuse rales but laterally.  Good inspiratory effort. . Abdomen: Soft nontender nondistended with normal bowel sounds x4 quadrants. . Musculoskeletal: Trace edema in lower extremities. 2/4 pulses in all 4 extremities. . Skin: No ulcerative lesions noted or rashes, . Psychiatry: Mood is appropriate for condition and setting   Data  Reviewed: CBC: Recent Labs  Lab 09/26/17 1437 09/27/17 0429  WBC 4.9 3.9*  NEUTROABS 4.1  --   HGB 10.9* 10.3*  HCT 36.2* 33.2*  MCV 88.1 88.3  PLT 127* 97*   Basic Metabolic Panel: Recent Labs  Lab 09/26/17 1437 09/26/17 2232 09/27/17 0429  NA 136  --  137  K 4.6  --  4.0  CL 102  --  103  CO2 23  --  23  GLUCOSE 107*  --  90  BUN 20  --  20  CREATININE 1.14  --  1.04  CALCIUM 8.8*  --  8.0*  MG  --  2.1  --    GFR: Estimated Creatinine Clearance: 49.1 mL/min (by C-G formula based on SCr of 1.04 mg/dL). Liver Function Tests: Recent Labs  Lab 09/26/17 1437  AST 21  ALT 11*  ALKPHOS 60  BILITOT 1.0  PROT 6.4*  ALBUMIN 3.5   No results for input(s): LIPASE, AMYLASE in the last 168 hours. No results for input(s): AMMONIA in the last 168 hours. Coagulation Profile: Recent Labs  Lab 09/26/17 1437 09/27/17 0429  INR 2.17 2.24   Cardiac Enzymes: Recent Labs  Lab 09/26/17 1437 09/26/17 2232 09/27/17 0429 09/27/17 0905  TROPONINI 0.04* 0.04* 0.03* 0.12*   BNP (last 3 results) No results for input(s): PROBNP in the last 8760 hours. HbA1C: No results for input(s): HGBA1C in the last 72 hours. CBG: No results for input(s): GLUCAP in the last 168 hours. Lipid Profile: No results for input(s): CHOL, HDL, LDLCALC, TRIG, CHOLHDL, LDLDIRECT in the last 72 hours. Thyroid Function Tests: Recent Labs    09/26/17 2232  TSH 1.386   Anemia Panel: No results for input(s): VITAMINB12, FOLATE, FERRITIN, TIBC, IRON, RETICCTPCT in the last 72 hours. Urine analysis:    Component Value Date/Time   COLORURINE YELLOW 09/26/2017 1702   APPEARANCEUR HAZY (A) 09/26/2017 1702   LABSPEC 1.011 09/26/2017 1702   PHURINE 5.0 09/26/2017 1702   GLUCOSEU NEGATIVE 09/26/2017 1702   HGBUR SMALL (A) 09/26/2017 1702   BILIRUBINUR NEGATIVE 09/26/2017 1702   KETONESUR NEGATIVE 09/26/2017 1702   PROTEINUR NEGATIVE 09/26/2017 1702   UROBILINOGEN 1.0 11/19/2011 1459   NITRITE  NEGATIVE 09/26/2017 1702   LEUKOCYTESUR MODERATE (A) 09/26/2017 1702   Sepsis Labs: @LABRCNTIP (procalcitonin:4,lacticidven:4)  ) Recent Results (from the past 240 hour(s))  Blood Culture (routine x 2)     Status: None (Preliminary result)   Collection Time: 09/26/17  2:41 PM  Result Value Ref Range Status   Specimen Description BLOOD RIGHT HAND  Final   Special Requests   Final    BOTTLES DRAWN AEROBIC AND ANAEROBIC Blood Culture results may not be optimal due to an inadequate volume of blood received in culture bottles   Culture   Final    NO GROWTH < 24 HOURS Performed at [facility], [place] [address] [place], [place] [address]    Report Status PENDING  Incomplete  Blood Culture (routine x 2)     Status: None (Preliminary result)   Collection Time: 09/26/17  2:45 PM  Result Value Ref Range Status   Specimen Description BLOOD LEFT FOREARM  Final   Special Requests   Final    BOTTLES DRAWN AEROBIC AND ANAEROBIC Blood Culture adequate volume   Culture   Final    NO GROWTH < 24 HOURS Performed at [facility], [address] [place], [place] [address]    Report Status PENDING  Incomplete  MRSA PCR Screening     Status: None   Collection Time: 09/27/17  8:03 AM  Result Value Ref Range Status   MRSA by PCR NEGATIVE NEGATIVE Final    Comment:        The GeneXpert MRSA Assay (FDA approved for NASAL specimens only), is one component of a comprehensive MRSA colonization surveillance program. It is not intended to diagnose MRSA infection nor to guide or monitor treatment for MRSA infections. Performed at [facility], [place] [address] [place], [place] [address]       Studies: Ct Head Wo Contrast  Result Date: 09/26/2017 CLINICAL DATA:  Increased weakness for 2 days. Shortness of breath. Febrile. EXAM: CT HEAD WITHOUT CONTRAST TECHNIQUE: Contiguous axial images were obtained from the base of the skull through the vertex without intravenous contrast.  COMPARISON:  None. FINDINGS: Brain: No evidence of acute infarction, hemorrhage, hydrocephalus, extra-axial collection or mass lesion/mass effect. S phone lesion in the right cerebellar cortex, likely due to prior ischemic infarction. Advanced brain parenchymal volume loss and deep white matter microangiopathy. Vascular: Calcific atherosclerotic disease at the skull base. Skull: Normal. Negative for fracture or focal lesion. Sinuses/Orbits: Mild polypoid mucosal thickening of bilateral ethmoid, frontal and right maxillary sinuses. Other: None. IMPRESSION: No acute intracranial abnormality. Advanced brain parenchymal atrophy and chronic microvascular disease. Remote right cerebellar ischemic infarct. Chronic sinusitis. Electronically Signed   By: Fidela Salisbury M.D.   On: 09/26/2017 18:16   Ct Angio Chest Pe W/cm &/or Wo Cm  Result Date: 09/26/2017 CLINICAL DATA:  Increased weakness x2 days. Patient fell today. Fever and dyspnea. EXAM: CT ANGIOGRAPHY CHEST WITH CONTRAST TECHNIQUE: Multidetector CT imaging of the chest was performed using the standard protocol during bolus administration of intravenous contrast. Multiplanar CT image reconstructions and MIPs were obtained to evaluate the vascular anatomy. CONTRAST:  167mL ISOVUE-370 IOPAMIDOL (ISOVUE-370) INJECTION 76% COMPARISON:  Same day CXR and 03/11/2015 CXR FINDINGS: Cardiovascular: No large central pulmonary embolus allowing for motion related artifacts limiting assessment of the more distal segmental pulmonary arteries. Satisfactory opacification of the pulmonary arteries to the proximal segmental levels. There is cardiomegaly with left main and three-vessel coronary arteriosclerosis. Moderate aortic atherosclerosis with 4.6 cm aneurysmal dilatation of the ascending aorta. No definite evidence of dissection though there is preferential opacification of the pulmonary arteries on this study. Mediastinum/Nodes: Small subcentimeter and bilateral hilar lymph  nodes likely reactive. Trachea mainstem bronchi are patent. No acute esophageal abnormality. No thyromegaly or mass. Lungs/Pleura: Limited by motion artifacts. Faint bilateral airspace opacity superimposed on subpleural chronic interstitial lung disease and fibrosis is identified, multilobar a more confluent in the right upper and lower lobes. Trace bilateral pleural effusions. No identified dominant mass. Upper Abdomen: Bilateral upper pole water attenuating cysts of both kidneys, largest approximately 6.4 cm medially in the right upper pole. The unenhanced liver is unremarkable. There is no splenomegaly. No acute abnormality within the upper abdomen. Musculoskeletal: No aggressive osseous lesions. Thoracic spondylosis. Review of the MIP images confirms the above findings. IMPRESSION: 1. Chronic interstitial lung disease with bilateral subpleural areas of reticular fibrosis  with superimposed bilateral multilobar airspace opacities predominantly right upper and lower lobes suggesting superimposed infection/pneumonia. Small reactive lymph nodes the mediastinum and hila. Trace bilateral pleural effusions. 2. No large central pulmonary embolus given limitations of respiratory motion artifact. 3. Aortic atherosclerosis and coronary arteriosclerosis. 4. 4.6 cm ascending thoracic aortic aneurysm. No dissection. Ascending thoracic aortic aneurysm. Recommend semi-annual imaging followup by CTA or MRA and referral to cardiothoracic surgery if not already obtained. This recommendation follows 2010 ACCF/AHA/AATS/ACR/ASA/SCA/SCAI/SIR/STS/SVM Guidelines for the Diagnosis and Management of Patients With Thoracic Aortic Disease. Circulation. 2010; 121: e266-e369 5. Multilevel degenerative disc and endplate changes compatible thoracic spondylosis. No aggressive osseous lesions. Aortic aneurysm NOS (ICD10-I71.9). Aortic Atherosclerosis (ICD10-I70.0). Electronically Signed   By: Ashley Royalty M.D.   On: 09/26/2017 18:39   Dg Chest  Port 1 View  Result Date: 09/26/2017 CLINICAL DATA:  Weakness EXAM: PORTABLE CHEST 1 VIEW COMPARISON:  03/11/2015 chest radiograph. FINDINGS: Stable cardiomediastinal silhouette with mild cardiomegaly. No pneumothorax. No pleural effusion. Patchy reticular opacities throughout both lungs, not appreciably changed. No superimposed acute consolidative airspace disease. IMPRESSION: 1. No appreciable change in patchy reticular opacities throughout both lungs, which may indicate chronic interstitial lung disease. 2. No acute superimposed consolidative airspace disease. 3. Stable mild cardiomegaly. Electronically Signed   By: Ilona Sorrel M.D.   On: 09/26/2017 15:26    Scheduled Meds: . finasteride  5 mg Oral QHS  . furosemide  40 mg Oral Daily  . ipratropium-albuterol  3 mL Nebulization Q6H  . lisinopril  20 mg Oral Daily  . metoprolol tartrate  12.5 mg Oral Daily  . oseltamivir  30 mg Oral BID  . simvastatin  20 mg Oral QHS  . terazosin  10 mg Oral QHS  . warfarin  5 mg Oral ONCE-1800  . Warfarin - Pharmacist Dosing Inpatient   Does not apply q1800    Continuous Infusions: . azithromycin    . cefTRIAXone (ROCEPHIN)  IV       LOS: 1 day     Kayleen Memos, MD [facility] Pager [phone]  If 7PM-7AM, please contact night-coverage [email or web address] Password [email or web address] 09/27/2017, 1:52 PM

## 2017-09-27 NOTE — NC FL2 (Signed)
[place] LEVEL OF CARE SCREENING TOOL     IDENTIFICATION  Patient Name: Chad Buchanan. Birthdate: [birth year] Sex: male Admission Date (Current Location): 09/26/2017  [facility] Department and [place] Number:  Herbalist and Address:  [facility]. [facility], [place] [address] [place], [place] [address]      Provider Number: [id number]  Attending Physician Name and Address:  Kayleen Memos, DO  Relative Name and Phone Number:       Current Level of Care: Hospital Recommended Level of Care: [place] Prior Approval Number:    Date Approved/Denied:   PASRR Number: [id number]  Discharge Plan: SNF    Current Diagnoses: Patient Active Problem List   Diagnosis Date Noted  . CAP (community acquired pneumonia) 09/26/2017  . Chronic diastolic CHF (congestive heart failure) ([place]) 09/26/2017  . Acute diastolic CHF (congestive heart failure) ([place]) 03/12/2015  . Acute respiratory failure with hypoxia ([place]) 03/11/2015  . Acute congestive heart failure ([place]) 03/11/2015  . Candidal dermatitis 03/11/2015  . Conjunctivitis 03/11/2015  . Fever and chills 05/02/2011  . Hypoxia 05/02/2011  . Atrial fibrillation, chronic ([place]) 05/02/2011  . Coronary atherosclerosis 05/02/2011  . Impaired fasting glucose 05/02/2011  . Unspecified hearing loss 05/02/2011  . Essential hypertension 05/02/2011  . Right hip pain 05/02/2011  . AAA (abdominal aortic aneurysm) without rupture ([place]) 05/02/2011  . Other and unspecified hyperlipidemia 05/02/2011  . Hypokalemia 05/02/2011  . Abdominal aneurysm without mention of rupture 03/25/2011    Orientation RESPIRATION BLADDER Height & Weight     Self, Time, Situation, Place  O2(Nasal Canula 2 L) Continent Weight: 198 lb 6.6 oz (90 kg) Height:  6\' 1"  (185.4 cm)  BEHAVIORAL SYMPTOMS/MOOD NEUROLOGICAL BOWEL NUTRITION STATUS  (None) (None) Continent Diet(Heart healthy. Fluid restriction 1200 mL.)  AMBULATORY STATUS  COMMUNICATION OF NEEDS Skin   Limited Assist Verbally Skin abrasions, PU Stage and Appropriate Care PU Stage 1 Dressing: (Sacrum: Foam.)                     Personal Care Assistance Level of Assistance  Bathing, Feeding, Dressing Bathing Assistance: Limited assistance Feeding assistance: Limited assistance Dressing Assistance: Limited assistance     Functional Limitations Info  Sight, Hearing, Speech Sight Info: Adequate Hearing Info: Impaired Speech Info: Adequate    SPECIAL CARE FACTORS FREQUENCY  Blood pressure, PT (By licensed PT), OT (By licensed OT)     PT Frequency: 5 x week OT Frequency: 5 x week            Contractures Contractures Info: Not present    Additional Factors Info  Isolation Precautions Code Status Info: DNR Allergies Info: Oxycodone Hcl, Ultram (Tramadol Hcl).     Isolation Precautions Info: Droplet     Current Medications (09/27/2017):  This is the current hospital active medication list Current Facility-Administered Medications  Medication Dose Route Frequency Provider Last Rate Last Dose  . acetaminophen (TYLENOL) tablet 650 mg  650 mg Oral Q6H PRN Rise Patience, MD       Or  . acetaminophen (TYLENOL) suppository 650 mg  650 mg Rectal Q6H PRN Rise Patience, MD      . albuterol (PROVENTIL) (2.5 MG/3ML) 0.083% nebulizer solution 2.5 mg  2.5 mg Nebulization Q2H PRN Rise Patience, MD   2.5 mg at 09/27/17 0129  . azithromycin (ZITHROMAX) 500 mg in sodium chloride 0.9 % 250 mL IVPB  500 mg Intravenous Q24H Rise Patience, MD  Stopped at 09/27/17 1601  . cefTRIAXone (ROCEPHIN) 1 g in sodium chloride 0.9 % 100 mL IVPB  1 g Intravenous Q24H Rise Patience, MD   Stopped at 09/27/17 1430  . finasteride (PROSCAR) tablet 5 mg  5 mg Oral QHS Rise Patience, MD   5 mg at 09/27/17 0124  . furosemide (LASIX) tablet 40 mg  40 mg Oral Daily Rise Patience, MD   40 mg at 09/27/17 0953  . ipratropium-albuterol  (DUONEB) 0.5-2.5 (3) MG/3ML nebulizer solution 3 mL  3 mL Nebulization Q6H Hall, Carole N, DO   3 mL at 09/27/17 1347  . lisinopril (PRINIVIL,ZESTRIL) tablet 20 mg  20 mg Oral Daily Rise Patience, MD   20 mg at 09/27/17 0953  . metoprolol tartrate (LOPRESSOR) tablet 12.5 mg  12.5 mg Oral Daily Rise Patience, MD   12.5 mg at 09/27/17 0953  . ondansetron (ZOFRAN) tablet 4 mg  4 mg Oral Q6H PRN Rise Patience, MD       Or  . ondansetron [facility]) injection 4 mg  4 mg Intravenous Q6H PRN Rise Patience, MD      . oseltamivir (TAMIFLU) capsule 30 mg  30 mg Oral BID Rise Patience, MD   30 mg at 09/27/17 0953  . simvastatin (ZOCOR) tablet 20 mg  20 mg Oral QHS Rise Patience, MD   20 mg at 09/27/17 0124  . terazosin (HYTRIN) capsule 10 mg  10 mg Oral QHS Rise Patience, MD   10 mg at 09/27/17 0123  . warfarin (COUMADIN) tablet 5 mg  5 mg Oral ONCE-1800 Hall, Carole N, DO      . Warfarin - Pharmacist Dosing Inpatient   Does not apply [id number] Rise Patience, MD         Discharge Medications: Please see discharge summary for a list of discharge medications.  Relevant Imaging Results:  Relevant Lab Results:   Additional Information SS#: [id number]  Candie Chroman, LCSW

## 2017-09-27 NOTE — Evaluation (Signed)
Physical Therapy Evaluation Patient Details Name: Chad Buchanan. MRN: [id number] DOB: [birth year] Today's Date: 09/27/2017   History of Present Illness  84 yo admitted with weakness, falls, SOS flu, CHF. PMhx: MI, Afib, AAA, cancer, HTN, Hyperlipidemia  Clinical Impression  Pt demonstrates mobility impairments related to generalized weakness, balance deficits, decreased safety awareness, and deconditioning including SpO2 desaturation during ambulation. Safety concerns secondary to lack of 24 hour support discussed with pt and SNF recommended. Pt would benefit from Acute PT to improve functional mobility, level of independence for D/C home, and to decrease fall risk    Follow Up Recommendations SNF;Supervision/Assistance - 24 hour    Equipment Recommendations  None recommended by PT   Recommendations for Other Services       Precautions / Restrictions Precautions Precautions: Fall;Other (comment)(Droplet) Restrictions Weight Bearing Restrictions: No      Mobility  Bed Mobility Overal bed mobility: Modified Independent;Needs Assistance Bed Mobility: Supine to Sit     Supine to sit: Supervision     General bed mobility comments: Pt demonstrated ability to perform supine with HOB elevated>sit EOB (>R) with increased time required. Pt able to scoot forward in bed to allow feet to reach the floor  Transfers Overall transfer level: Needs assistance Equipment used: Rolling walker (2 wheeled) Transfers: Sit to/from Stand Sit to Stand: Min assist         General transfer comment: Pt required min A for sit<>stand with hands pushing from bed to stand. Pt then able to transition BUE to RW. Pt lacked control when returning to sitting in recliner chair as he plopped into chair.  Ambulation/Gait Ambulation/Gait assistance: Mod assist;Min assist(Mod A required at times during gait due to 2 episodes of LOB to R) Ambulation Distance (Feet): 12 Feet Assistive device: Rolling walker  (2 wheeled) Gait Pattern/deviations: Step-through pattern;Trunk flexed   Gait velocity interpretation: <1.8 ft/sec, indicate of risk for recurrent falls General Gait Details: Pt ambulates with RW excessively anterior due to increased trunk and hip flexion. Pt SpO2 % desat during gait to 77% on 2 LO2 and returned to normal within 10-15 seconds. Resting HR: 77 bpm, Resting BP: 119/63, Resting SpO2: 96%. Vitals post ambulation: HR: 107, BP: 134/62, SpO2: 77% (Returned to normal within 10-15 seconds  Stairs            Wheelchair Mobility    Modified Rankin (Stroke Patients Only)       Balance Overall balance assessment: Needs assistance Sitting-balance support: No upper extremity supported Sitting balance-[name]: Fair Sitting balance - Comments: Min Guard for Sitting balance   Standing balance support: Bilateral upper extremity supported Standing balance-[name]: Poor Standing balance comment: Pt requires heavy support on RW for support and exhibits excessive forward lean                             Pertinent Vitals/Pain Pain Assessment: No/denies pain    Home Living Family/patient expects to be discharged to:: Private residence Living Arrangements: Spouse/significant other;Children(Grandson and his girlfriend) Available Help at Discharge: Available PRN/intermittently Type of Home: House Home Access: Stairs to enter Entrance Stairs-Rails: Right;Left(Too wide to reach both) Technical brewer of Steps: 12   Home Equipment: Cane - single point;Walker - 2 wheels      Prior Function Level of Independence: Independent with assistive device(s)               Hand Dominance  Extremity/Trunk Assessment   Upper Extremity Assessment Upper Extremity Assessment: Defer to OT evaluation    Lower Extremity Assessment Lower Extremity Assessment: Generalized weakness    Cervical / Trunk Assessment Cervical / Trunk Assessment:  Kyphotic(Forward head rounded shoulders, Exhibits significant hip flexion with standing )  Communication   Communication: HOH  Cognition Arousal/Alertness: Awake/alert Behavior During Therapy: WFL for tasks assessed/performed Overall Cognitive Status: No family/caregiver present to determine baseline cognitive functioning                                 General Comments: Pt HOH requiring repetition frequently. Pt demonstrated decreased safety awareness regarding D/C to home      General Comments      Exercises     Assessment/Plan    PT Assessment Patient needs continued PT services  PT Problem List Decreased strength;Decreased activity tolerance;Decreased balance;Decreased mobility;Decreased cognition;Decreased knowledge of use of DME;Decreased safety awareness;Cardiopulmonary status limiting activity       PT Treatment Interventions DME instruction;Gait training;Stair training;Functional mobility training;Therapeutic activities;Therapeutic exercise;Balance training;Neuromuscular re-education;Patient/family education    PT Goals (Current goals can be found in the Care Plan section)  Acute Rehab PT Goals Patient Stated Goal: To return home PT Goal Formulation: With patient Time For Goal Achievement: 10/11/17 Potential to Achieve Goals: Fair    Frequency Min 3X/week   Barriers to discharge Inaccessible home environment;Decreased caregiver support;Other (comment) Pt requires 24 hour supervision for safety and reports 12 STE. O2 desat presents barrier as well    Co-evaluation               AM-PAC PT "6 Clicks" Daily Activity  Outcome Measure Difficulty turning over in bed (including adjusting bedclothes, sheets and blankets)?: None Difficulty moving from lying on back to sitting on the side of the bed? : A Little Difficulty sitting down on and standing up from a chair with arms (e.g., wheelchair, bedside commode, etc,.)?: Unable Help needed moving to and  from a bed to chair (including a wheelchair)?: A Lot Help needed walking in hospital room?: A Lot Help needed climbing 3-5 steps with a railing? : Total 6 Click Score: 13    End of Session Equipment Utilized During Treatment: Gait belt;Oxygen(2LO2 via [place]) Activity Tolerance: Patient tolerated treatment well;Other (comment)(Pt desat during ambulation) Patient left: in chair;with chair alarm set;with call bell/phone within reach Nurse Communication: Mobility status;Other (comment)(O2 desat via whiteboard) PT Visit Diagnosis: Unsteadiness on feet (R26.81);Other abnormalities of gait and mobility (R26.89);Repeated falls (R29.6);Muscle weakness (generalized) (M62.81);History of falling (Z91.81)    Time: 4193-7902 PT Time Calculation (min) (ACUTE ONLY): 28 min   Charges:   PT Evaluation $PT Eval Moderate Complexity: 1 Mod PT Treatments $Therapeutic Activity: 8-22 mins   PT G Codes:        Gabe Carmen Vallecillo, SPT  [employer] 09/27/2017, 10:30 AM

## 2017-09-27 NOTE — Progress Notes (Signed)
[facility] for Coumadin Indication: atrial fibrillation  Allergies  Allergen Reactions  . Oxycodone Hcl Other (See Comments)    Reaction:  Somnolence   . Ultram [Tramadol Hcl] Other (See Comments)    Reaction:  Somnolence     Patient Measurements: Height: 6\' 1"  (185.4 cm) Weight: 198 lb 6.6 oz (90 kg) IBW/kg (Calculated) : 79.9   Vital Signs: Temp: 98.1 F (36.7 C) (05/06 0753) Temp Source: Oral (05/06 0753) BP: 134/62 (05/06 0939) Pulse Rate: 107 (05/06 0939)  Labs: Recent Labs    09/26/17 1437 09/26/17 2232 09/27/17 0429  HGB 10.9*  --  10.3*  HCT 36.2*  --  33.2*  PLT 127*  --  97*  LABPROT 24.0*  --  24.6*  INR 2.17  --  2.24  CREATININE 1.14  --  1.04  TROPONINI 0.04* 0.04* 0.03*    Estimated Creatinine Clearance: 49.1 mL/min (by C-G formula based on SCr of 1.04 mg/dL).  Assessment: 84 yo man to continue coumadin for afib.  INR is therapeutic on admission INR therapeutic today Dose prior to admission = 5 mg M, Tues, Fri  7.5 mg other days  Goal of Therapy:  INR 2-3 Monitor platelets by anticoagulation protocol: Yes   Plan:  Coumadin 5 mg po tonight Daily PT/INR Monitor for bleeding complications  Thank you Anette Guarneri, PharmD [phone]  09/27/2017,10:15 AM

## 2017-09-28 ENCOUNTER — Ambulatory Visit: Payer: Medicare HMO | Admitting: Podiatry

## 2017-09-28 LAB — CBC
HCT: 32.2 % — ABNORMAL LOW (ref 39.0–52.0)
Hemoglobin: 9.7 g/dL — ABNORMAL LOW (ref 13.0–17.0)
MCH: 26.6 pg (ref 26.0–34.0)
MCHC: 30.1 g/dL (ref 30.0–36.0)
MCV: 88.5 fL (ref 78.0–100.0)
PLATELETS: 105 10*3/uL — AB (ref 150–400)
RBC: 3.64 MIL/uL — ABNORMAL LOW (ref 4.22–5.81)
RDW: 16.3 % — AB (ref 11.5–15.5)
WBC: 3.4 10*3/uL — ABNORMAL LOW (ref 4.0–10.5)

## 2017-09-28 LAB — URINE CULTURE

## 2017-09-28 LAB — LEGIONELLA PNEUMOPHILA SEROGP 1 UR AG: L. pneumophila Serogp 1 Ur Ag: NEGATIVE

## 2017-09-28 LAB — PROTIME-INR
INR: 2.54
Prothrombin Time: 27.1 seconds — ABNORMAL HIGH (ref 11.4–15.2)

## 2017-09-28 MED ORDER — IPRATROPIUM-ALBUTEROL 0.5-2.5 (3) MG/3ML IN SOLN
3.0000 mL | Freq: Three times a day (TID) | RESPIRATORY_TRACT | Status: DC
  Administered 2017-09-29 – 2017-09-30 (×4): 3 mL via RESPIRATORY_TRACT
  Filled 2017-09-28 (×4): qty 3

## 2017-09-28 MED ORDER — IPRATROPIUM-ALBUTEROL 0.5-2.5 (3) MG/3ML IN SOLN
3.0000 mL | Freq: Four times a day (QID) | RESPIRATORY_TRACT | Status: DC
  Administered 2017-09-28 (×3): 3 mL via RESPIRATORY_TRACT
  Filled 2017-09-28 (×2): qty 3

## 2017-09-28 MED ORDER — METHYLPREDNISOLONE SODIUM SUCC 125 MG IJ SOLR
60.0000 mg | Freq: Three times a day (TID) | INTRAMUSCULAR | Status: DC
  Administered 2017-09-28 – 2017-09-29 (×4): 60 mg via INTRAVENOUS
  Filled 2017-09-28 (×4): qty 2

## 2017-09-28 MED ORDER — FUROSEMIDE 10 MG/ML IJ SOLN
40.0000 mg | Freq: Two times a day (BID) | INTRAMUSCULAR | Status: DC
  Administered 2017-09-28 (×2): 40 mg via INTRAVENOUS
  Filled 2017-09-28 (×2): qty 4

## 2017-09-28 MED ORDER — IPRATROPIUM-ALBUTEROL 0.5-2.5 (3) MG/3ML IN SOLN
3.0000 mL | Freq: Three times a day (TID) | RESPIRATORY_TRACT | Status: DC
  Filled 2017-09-28: qty 3

## 2017-09-28 MED ORDER — WARFARIN SODIUM 5 MG PO TABS
5.0000 mg | ORAL_TABLET | Freq: Once | ORAL | Status: AC
  Administered 2017-09-28: 5 mg via ORAL
  Filled 2017-09-28: qty 1

## 2017-09-28 NOTE — Progress Notes (Signed)
[facility] for Coumadin Indication: atrial fibrillation  Allergies  Allergen Reactions  . Oxycodone Hcl Other (See Comments)    Reaction:  Somnolence   . Ultram [Tramadol Hcl] Other (See Comments)    Reaction:  Somnolence     Patient Measurements: Height: 6\' 1"  (185.4 cm) Weight: 192 lb 1.6 oz (87.1 kg) IBW/kg (Calculated) : 79.9   Vital Signs: Temp: 97.4 F (36.3 C) (05/07 0821) Temp Source: Oral (05/07 0821) BP: 141/80 (05/07 0821) Pulse Rate: 73 (05/07 0821)  Labs: Recent Labs    09/26/17 1437 09/26/17 2232 09/27/17 0429 09/27/17 0905 09/28/17 0222  HGB 10.9*  --  10.3*  --  9.7*  HCT 36.2*  --  33.2*  --  32.2*  PLT 127*  --  97*  --  105*  LABPROT 24.0*  --  24.6*  --  27.1*  INR 2.17  --  2.24  --  2.54  CREATININE 1.14  --  1.04  --   --   TROPONINI 0.04* 0.04* 0.03* 0.12*  --     Estimated Creatinine Clearance: 49.1 mL/min (by C-G formula based on SCr of 1.04 mg/dL).  Assessment: 84 yo man to continue coumadin for afib.  INR is therapeutic on admission INR therapeutic today Dose prior to admission = 5 mg M, Tues, Fri  7.5 mg other days  Goal of Therapy:  INR 2-3 Monitor platelets by anticoagulation protocol: Yes   Plan:  Coumadin 5 mg po tonight Daily PT/INR Monitor for bleeding complications  Thank you Murlean Iba, PharmD candidate  09/28/2017,9:36 AM

## 2017-09-28 NOTE — Plan of Care (Signed)
Continue current care plan 

## 2017-09-28 NOTE — Progress Notes (Signed)
PROGRESS NOTE  Chad Buchanan. LOV:[id number] DOB: Feb 05, 1921 DOA: 09/26/2017 PCP: Crist Infante, MD  HPI/Recap of past 24 hours: Chad Buchanan. is a 84 y.o. male with history of chronic diastolic CHF last EF measured in 2016 was 50 to 55%, paroxysmal atrial fibrillation, hypertension, BPH, hyperlipidemia, aortic aneurysm was brought to the ER after patient was found to be weak and had 2 falls while going to the bathroom.  Patient states he felt weak and had to sit down while going to the bathroom but did not hit his head or lose consciousness.  Has been feeling short of breath which is chronic but has mildly worsened denies any productive cough fever or chills.   ED Course: In the ER patient is found to be febrile with temperature of 101.1 F.  CT head was unremarkable CT angiogram of the chest shows pneumonia.  Patient remained hypoxic in the ER and has been admitted for further management of acute respiratory failure likely from pneumonia.  Patient denies any chest pain.  Patient's family states that patient's grandson who lives with him had influenza pneumonia last week.   09/27/17: Patient seen and examined at his bedside.  He has no new complaints.  09/28/17: very hard of hearing. Denies chest pain. Wheezing this morning. Duonebs started q6h. Added IV steroids, IV lasix 40 mg BID. C/w antibiotics and tamiflu. Updated the daughter in law.  Assessment/Plan: Principal Problem:   CAP (community acquired pneumonia) Active Problems:   Atrial fibrillation, chronic (HCC)   Essential hypertension   AAA (abdominal aortic aneurysm) without rupture (HCC)   Acute respiratory failure with hypoxia (HCC)   Chronic diastolic CHF (congestive heart failure) (HCC)   Acute hypoxic respiratory failure secondary to community-acquired pneumonia versus influenza A Blood cultures no growth to date  Sputum culture pending Continue Tamiflu Continue IV azithromycin and IV ceftriaxone empirically while  awaiting for the results of sputum culture Started duo nebs every 6 hours  Continue ipratropium every 2 hours as needed Started IV Solu-Medrol 60 mg 3 times daily Continue O2 supplementation to maintain O2 saturation 92% or greater  Chronic diastolic CHF, stable Mildly hypervolemic Started IV Lasix 40 mg twice daily On p.o. Lasix 40 daily at home Continue strict I's and O's and daily weight Last 2D echo revealed preserved LV EF 50 to 55%  Hypertension Blood pressure stable Continue home medications  Paroxysmal A. fib, stable Rate controlled on metoprolol Continue Coumadin INR is Therapeutic 2.24 Pharmacy monitoring Coumadin dosing  Chronic normocytic anemia Hemoglobin 9.7 from 10.3 No sign of overt bleeding Repeat CBC in the morning  CKD 2, stable Avoid nephrotoxic agents/hypotension At his baseline, stable  Aortic aneurysm, stable Denies any chest pain Continue monitoring  Ambulatory dysfunction/physical debility PT assessed and recommended SNF Fall precautions   Code Status: DNR  Family Communication: Daughter on phone  Disposition Plan: SNF when clinically stable   Consultants:  None  Procedures:  None  Antimicrobials:  IV ceftriaxone   IV azithromycin   Tamiflu  DVT prophylaxis: coumadin   Objective: Vitals:   09/28/17 0728 09/28/17 0732 09/28/17 0821 09/28/17 1208  BP: (!) 141/80  (!) 141/80 (!) 154/91  Pulse: 67 79 73 62  Resp: (!) 22 18 (!) 31 18  Temp: (!) 97.5 F (36.4 C)  (!) 97.4 F (36.3 C) 97.8 F (36.6 C)  TempSrc: Oral  Oral Oral  SpO2: 94% 98% 97% 97%  Weight:      Height:  Intake/Output Summary (Last 24 hours) at 09/28/2017 1324 Last data filed at 09/28/2017 0900 Gross per 24 hour  Intake 1550 ml  Output 325 ml  Net 1225 ml   Filed Weights   09/26/17 1600 09/27/17 0230 09/28/17 0548  Weight: 89.8 kg (198 lb) 90 kg (198 lb 6.6 oz) 87.1 kg (192 lb 1.6 oz)    Exam:  . General: 84 y.o. year-old male  well-developed well-nourished in no acute distress.  Very hard of hearing.   . Cardiovascular: Regular rate and rhythm with no rubs or gallops.  No thyromegaly or JVD noted.  Bilateral lower extremity trace edema. Marland Kitchen Respiratory: Diffuse rales and wheezing bilaterally.  Good inspiratory effort.   . Abdomen: Soft nontender nondistended with normal bowel sounds x4 quadrants. . Musculoskeletal: Trace edema in lower extremities. 2/4 pulses in all 4 extremities. . Skin: No ulcerative lesions noted or rashes, . Psychiatry: Mood is appropriate for condition and setting   Data Reviewed: CBC: Recent Labs  Lab 09/26/17 1437 09/27/17 0429 09/28/17 0222  WBC 4.9 3.9* 3.4*  NEUTROABS 4.1  --   --   HGB 10.9* 10.3* 9.7*  HCT 36.2* 33.2* 32.2*  MCV 88.1 88.3 88.5  PLT 127* 97* 161*   Basic Metabolic Panel: Recent Labs  Lab 09/26/17 1437 09/26/17 2232 09/27/17 0429  NA 136  --  137  K 4.6  --  4.0  CL 102  --  103  CO2 23  --  23  GLUCOSE 107*  --  90  BUN 20  --  20  CREATININE 1.14  --  1.04  CALCIUM 8.8*  --  8.0*  MG  --  2.1  --    GFR: Estimated Creatinine Clearance: 49.1 mL/min (by C-G formula based on SCr of 1.04 mg/dL). Liver Function Tests: Recent Labs  Lab 09/26/17 1437  AST 21  ALT 11*  ALKPHOS 60  BILITOT 1.0  PROT 6.4*  ALBUMIN 3.5   No results for input(s): LIPASE, AMYLASE in the last 168 hours. No results for input(s): AMMONIA in the last 168 hours. Coagulation Profile: Recent Labs  Lab 09/26/17 1437 09/27/17 0429 09/28/17 0222  INR 2.17 2.24 2.54   Cardiac Enzymes: Recent Labs  Lab 09/26/17 1437 09/26/17 2232 09/27/17 0429 09/27/17 0905  TROPONINI 0.04* 0.04* 0.03* 0.12*   BNP (last 3 results) No results for input(s): PROBNP in the last 8760 hours. HbA1C: No results for input(s): HGBA1C in the last 72 hours. CBG: No results for input(s): GLUCAP in the last 168 hours. Lipid Profile: No results for input(s): CHOL, HDL, LDLCALC, TRIG,  CHOLHDL, LDLDIRECT in the last 72 hours. Thyroid Function Tests: Recent Labs    09/26/17 2232  TSH 1.386   Anemia Panel: No results for input(s): VITAMINB12, FOLATE, FERRITIN, TIBC, IRON, RETICCTPCT in the last 72 hours. Urine analysis:    Component Value Date/Time   COLORURINE YELLOW 09/26/2017 1702   APPEARANCEUR HAZY (A) 09/26/2017 1702   LABSPEC 1.011 09/26/2017 1702   PHURINE 5.0 09/26/2017 1702   GLUCOSEU NEGATIVE 09/26/2017 1702   HGBUR SMALL (A) 09/26/2017 1702   BILIRUBINUR NEGATIVE 09/26/2017 1702   KETONESUR NEGATIVE 09/26/2017 1702   PROTEINUR NEGATIVE 09/26/2017 1702   UROBILINOGEN 1.0 11/19/2011 1459   NITRITE NEGATIVE 09/26/2017 1702   LEUKOCYTESUR MODERATE (A) 09/26/2017 1702   Sepsis Labs: @LABRCNTIP (procalcitonin:4,lacticidven:4)  ) Recent Results (from the past 240 hour(s))  Blood Culture (routine x 2)     Status: None (Preliminary result)   Collection Time:  09/26/17  2:41 PM  Result Value Ref Range Status   Specimen Description BLOOD RIGHT HAND  Final   Special Requests   Final    BOTTLES DRAWN AEROBIC AND ANAEROBIC Blood Culture results may not be optimal due to an inadequate volume of blood received in culture bottles   Culture   Final    NO GROWTH < 24 HOURS Performed at [facility] [address], [place], [place] [address]    Report Status PENDING  Incomplete  Blood Culture (routine x 2)     Status: None (Preliminary result)   Collection Time: 09/26/17  2:45 PM  Result Value Ref Range Status   Specimen Description BLOOD LEFT FOREARM  Final   Special Requests   Final    BOTTLES DRAWN AEROBIC AND ANAEROBIC Blood Culture adequate volume   Culture   Final    NO GROWTH < 24 HOURS Performed at [facility], [place] [address], [place], [place] [address]    Report Status PENDING  Incomplete  Urine culture     Status: Abnormal   Collection Time: 09/26/17  9:48 PM  Result Value Ref Range Status   Specimen Description URINE, CLEAN CATCH   Final   Special Requests NONE  Final   Culture (A)  Final    <10,000 COLONIES/mL INSIGNIFICANT GROWTH Performed at [facility], [place] [address], [place], [place] [address]    Report Status 09/28/2017 FINAL  Final  MRSA PCR Screening     Status: None   Collection Time: 09/27/17  8:03 AM  Result Value Ref Range Status   MRSA by PCR NEGATIVE NEGATIVE Final    Comment:        The GeneXpert MRSA Assay (FDA approved for NASAL specimens only), is one component of a comprehensive MRSA colonization surveillance program. It is not intended to diagnose MRSA infection nor to guide or monitor treatment for MRSA infections. Performed at [facility], [place] [address] [place], [place] [address]       Studies: No results found.  Scheduled Meds: . finasteride  5 mg Oral QHS  . furosemide  40 mg Intravenous BID  . ipratropium-albuterol  3 mL Nebulization Q6H  . lisinopril  20 mg Oral Daily  . mouth rinse  15 mL Mouth Rinse BID  . methylPREDNISolone (SOLU-MEDROL) injection  60 mg Intravenous TID  . metoprolol tartrate  12.5 mg Oral Daily  . oseltamivir  30 mg Oral BID  . simvastatin  20 mg Oral QHS  . terazosin  10 mg Oral QHS  . warfarin  5 mg Oral ONCE-1800  . Warfarin - Pharmacist Dosing Inpatient   Does not apply q1800    Continuous Infusions: . azithromycin Stopped (09/27/17 1601)  . cefTRIAXone (ROCEPHIN)  IV Stopped (09/27/17 1430)     LOS: 2 days     Kayleen Memos, MD [facility] Pager [phone]  If 7PM-7AM, please contact night-coverage [email or web address] Password [email or web address] 09/28/2017, 1:24 PM

## 2017-09-28 NOTE — Clinical Social Work Note (Signed)
CSW provided patient and daughter-in-law with bed offers in case discharge plan changes to SNF.   [name], [place]

## 2017-09-28 NOTE — Care Management Note (Signed)
Case Management Note  Patient Details  Name: Chad Buchanan. MRN: [id number] Date of Birth: August 21, 1920  Subjective/Objective:  Pt admitted with flu                   Action/Plan:  PTA from home with wife.  Pt has walker and cane in the home, PCP and denied barriers with obtaining/paying for medications.   Pts grandson lives in the lower level of the home and pt will have 24 hour after starting 5/13.   CM met with pt and daughter- in- law at bedside.  Family/pt would like for pt to discharge home instead of SNF however are open to SNF.  CM provided [facility] list and requested pt and family discuss options.     Expected Discharge Date:  09/30/17               Expected Discharge Plan:     In-House Referral:  Clinical Social Work  Discharge planning Services  CM Consult  Post Acute Care Choice:    Choice offered to:     DME Arranged:    DME Agency:     HH Arranged:    HH Agency:     Status of Service:     If discussed at [employer], dates discussed:    Additional Comments:  Maryclare Labrador, RN 09/28/2017, 2:46 PM

## 2017-09-29 DIAGNOSIS — L8990 Pressure ulcer of unspecified site, unspecified stage: Secondary | ICD-10-CM

## 2017-09-29 LAB — BASIC METABOLIC PANEL
Anion gap: 8 (ref 5–15)
BUN: 25 mg/dL — AB (ref 6–20)
CHLORIDE: 103 mmol/L (ref 101–111)
CO2: 27 mmol/L (ref 22–32)
Calcium: 8 mg/dL — ABNORMAL LOW (ref 8.9–10.3)
Creatinine, Ser: 1.09 mg/dL (ref 0.61–1.24)
GFR calc Af Amer: >60 mL/min (ref >60)
GFR calc non Af Amer: 56 mL/min — ABNORMAL LOW (ref >60)
Glucose, Bld: 150 mg/dL — ABNORMAL HIGH (ref 65–99)
POTASSIUM: 3.9 mmol/L (ref 3.5–5.1)
SODIUM: 138 mmol/L (ref 135–145)

## 2017-09-29 LAB — CBC
HCT: 34.8 % — ABNORMAL LOW (ref 39.0–52.0)
Hemoglobin: 10.7 g/dL — ABNORMAL LOW (ref 13.0–17.0)
MCH: 26.9 pg (ref 26.0–34.0)
MCHC: 30.7 g/dL (ref 30.0–36.0)
MCV: 87.4 fL (ref 78.0–100.0)
Platelets: 106 10*3/uL — ABNORMAL LOW (ref 150–400)
RBC: 3.98 MIL/uL — AB (ref 4.22–5.81)
RDW: 15.9 % — ABNORMAL HIGH (ref 11.5–15.5)
WBC: 2.7 10*3/uL — ABNORMAL LOW (ref 4.0–10.5)

## 2017-09-29 LAB — TROPONIN I

## 2017-09-29 LAB — PROTIME-INR
INR: 2.05
PROTHROMBIN TIME: 22.9 s — AB (ref 11.4–15.2)

## 2017-09-29 MED ORDER — AZITHROMYCIN 500 MG PO TABS
500.0000 mg | ORAL_TABLET | Freq: Every day | ORAL | Status: DC
  Administered 2017-09-29 – 2017-09-30 (×2): 500 mg via ORAL
  Filled 2017-09-29 (×2): qty 1

## 2017-09-29 MED ORDER — FUROSEMIDE 10 MG/ML IJ SOLN
20.0000 mg | Freq: Two times a day (BID) | INTRAMUSCULAR | Status: DC
  Administered 2017-09-29: 20 mg via INTRAVENOUS
  Filled 2017-09-29: qty 2

## 2017-09-29 MED ORDER — SENNOSIDES-DOCUSATE SODIUM 8.6-50 MG PO TABS
1.0000 | ORAL_TABLET | Freq: Every day | ORAL | Status: DC
  Administered 2017-09-29: 1 via ORAL
  Filled 2017-09-29: qty 1

## 2017-09-29 MED ORDER — WARFARIN SODIUM 7.5 MG PO TABS
7.5000 mg | ORAL_TABLET | Freq: Once | ORAL | Status: AC
  Administered 2017-09-29: 7.5 mg via ORAL
  Filled 2017-09-29: qty 1

## 2017-09-29 MED ORDER — PREDNISONE 50 MG PO TABS
50.0000 mg | ORAL_TABLET | Freq: Every day | ORAL | Status: DC
  Administered 2017-09-30: 50 mg via ORAL
  Filled 2017-09-29: qty 1

## 2017-09-29 NOTE — Progress Notes (Signed)
Physical Therapy Treatment Patient Details Name: Chad Buchanan. MRN: [id number] DOB: 07/25/1920 Today's Date: 09/29/2017    History of Present Illness 84 yo admitted with weakness, falls, SOS flu, CHF. PMhx: MI, Afib, AAA, cancer, HTN, Hyperlipidemia    PT Comments    Patient progressing with therapy this visit, ambulating with close supervision with intermittent min guard. Focused on mechanics and safe use of RW. Discussed safety concerns with son who was present for visit, son reports he will have assistance at home. Updated recs to reflect HHPT to maximize safety as patient has refused SNF recommendations.   Vitals Seated at Rest: BP 125/70  SpO2 96% RA  HR 74  After activity (100' of walking) BP 137/67  SpO2 88% then back to 94% on RA with 60 second pause, HR 80    Follow Up Recommendations  Home health PT;Supervision/Assistance - 24 hour     Equipment Recommendations  None recommended by PT    Recommendations for Other Services       Precautions / Restrictions Precautions Precautions: Fall;Other (comment)(Droplet) Restrictions Weight Bearing Restrictions: No    Mobility  Bed Mobility Overal bed mobility: Modified Independent;Needs Assistance Bed Mobility: Supine to Sit     Supine to sit: Supervision     General bed mobility comments: Pt demonstrated ability to perform supine with HOB elevated>sit EOB (>R) with increased time required. Pt able to scoot forward in bed to allow feet to reach the floor  Transfers Overall transfer level: Needs assistance Equipment used: Rolling walker (2 wheeled) Transfers: Sit to/from Stand Sit to Stand: Min guard         General transfer comment: Min guard for safety during sit<>stand transfers  Ambulation/Gait Ambulation/Gait assistance: Min guard;Supervision Ambulation Distance (Feet): 150 Feet Assistive device: Rolling walker (2 wheeled) Gait Pattern/deviations: Step-through pattern;Trunk flexed Gait velocity:  normal    General Gait Details: patient prefers flexed trunk posture due to lumbar pain, lowered RW slightly to allow patient to keep a closer proximity while not forcing into painful extension. Cues for proximity to RW which patient reports "will need to get use to". Advised patient that this is safer. Pt ambulated 150' on RA, become dyspenic towards end of session, SpO2 88% on RA.    Stairs             Wheelchair Mobility    Modified Rankin (Stroke Patients Only)       Balance Overall balance assessment: Needs assistance Sitting-balance support: No upper extremity supported Sitting balance-[name]: Fair     Standing balance support: Bilateral upper extremity supported Standing balance-[name]: Poor Standing balance comment: Pt requires heavy support on RW for support and exhibits excessive forward lean                            Cognition Arousal/Alertness: Awake/alert Behavior During Therapy: WFL for tasks assessed/performed Overall Cognitive Status: No family/caregiver present to determine baseline cognitive functioning                                 General Comments: Pt HOH requiring repetition frequently. Pt demonstrated decreased safety awareness regarding D/C to home      Exercises      General Comments General comments (skin integrity, edema, etc.): Discussed safety considerations and fall risk with son who was present towards end of visit.  Pertinent Vitals/Pain Pain Assessment: No/denies pain    Home Living Family/patient expects to be discharged to:: Private residence Living Arrangements: (grandson and his girlfriend) Available Help at Discharge: Available PRN/intermittently Type of Home: House Home Access: Stairs to enter Entrance Stairs-Rails: Right;Left Home Layout: Able to live on main level with bedroom/bathroom;Multi-level        Prior Function            PT Goals (current goals can now be found  in the care plan section) Acute Rehab PT Goals PT Goal Formulation: With patient Time For Goal Achievement: 10/11/17 Potential to Achieve Goals: Fair Progress towards PT goals: Progressing toward goals    Frequency    Min 3X/week      PT Plan Current plan remains appropriate    Co-evaluation              AM-PAC PT "6 Clicks" Daily Activity  Outcome Measure  Difficulty turning over in bed (including adjusting bedclothes, sheets and blankets)?: None Difficulty moving from lying on back to sitting on the side of the bed? : A Little Difficulty sitting down on and standing up from a chair with arms (e.g., wheelchair, bedside commode, etc,.)?: A Little Help needed moving to and from a bed to chair (including a wheelchair)?: A Little Help needed walking in hospital room?: A Little Help needed climbing 3-5 steps with a railing? : A Lot 6 Click Score: 18    End of Session Equipment Utilized During Treatment: Gait belt Activity Tolerance: Patient tolerated treatment well;Other (comment) Patient left: in chair;with chair alarm set;with call bell/phone within reach Nurse Communication: Mobility status;Other (comment) PT Visit Diagnosis: Unsteadiness on feet (R26.81);Other abnormalities of gait and mobility (R26.89);Repeated falls (R29.6);Muscle weakness (generalized) (M62.81);History of falling (Z91.81)     Time: 2025-4270 PT Time Calculation (min) (ACUTE ONLY): 30 min  Charges:  $Gait Training: 8-22 mins $Self Care/Home Management: 8-22                    G Codes:       Reinaldo Berber, PT, DPT Acute Rehab Services Pager: [phone]    Reinaldo Berber 09/29/2017, 4:43 PM

## 2017-09-29 NOTE — Consult Note (Signed)
            [facility] CM Primary Care Navigator  09/29/2017  Chad Buchanan. [birth year] [id number]   Went to see patient at the bedsideto identify possible discharge needs. Patient is very hard of hearing. Patient reports having found to be weak and had fallen while going to the bathroom that resulted to this admission; also reports shortness of breath but states it is chronic(acute respiratory failure likely from pneumonia/ flu).  Patientendorses Dr.Mark Perini with [employer] as hisprimary care provider.   [facility] [address].   Patient reportsmanaginghis ownmedications at home straight out of the containers.  Patient statesthat he was driving prior to this admissionbut has a stepson Toney Rakes) who will be able to providetransportation to hisdoctors' appointments after discharge.  Patient lives at home with wife but he is the caregiver for himself and his wife who has dementia. His stepson and girlfriend lives in the basement of their house and will be able to assist as needed.  Per MD note, discharge plan is skilled nursing facility (SNF) when clinically stable.  Patientvoiced understanding to call primary care provider's office when he returns home for a post discharge follow-up visit within1- 2 weeksor sooner if needs arise.Patient letter (with PCP's contact number) was provided ashisreminder.  Explained to [place] services available for health managementand resourcesat home and patient indicates interest.  Patient plans to talk with primary care provider onnext visit, for further health managementservicesthathewillbe needing- once dischargedhome.  Patient voiced understanding of needto seekreferral from primary care provider to [facility] care management if deemednecessaryand appropriate for anyservices in thefuture- once he gets back  home.  [facility] care management information was provided for future needs that he may have.   For additional questions please contact:  Edwena Felty A. Kameron Blethen, BSN, RN-BC [facility] PRIMARY CARE Navigator Cell: [phone]

## 2017-09-29 NOTE — Progress Notes (Signed)
[place] for Coumadin Indication: atrial fibrillation  Allergies  Allergen Reactions  . Oxycodone Hcl Other (See Comments)    Reaction:  Somnolence   . Ultram [Tramadol Hcl] Other (See Comments)    Reaction:  Somnolence     Patient Measurements: Height: 6\' 1"  (185.4 cm) Weight: 189 lb 11.2 oz (86 kg) IBW/kg (Calculated) : 79.9   Vital Signs: Temp: 97.4 F (36.3 C) (05/08 0329) Temp Source: Oral (05/08 0329) BP: 142/100 (05/08 0329) Pulse Rate: 64 (05/08 0329)  Labs: Recent Labs    09/26/17 1437  09/27/17 0429 09/27/17 0905 09/28/17 0222 09/29/17 0306 09/29/17 0749  HGB 10.9*  --  10.3*  --  9.7* 10.7*  --   HCT 36.2*  --  33.2*  --  32.2* 34.8*  --   PLT 127*  --  97*  --  105* 106*  --   LABPROT 24.0*  --  24.6*  --  27.1* 22.9*  --   INR 2.17  --  2.24  --  2.54 2.05  --   CREATININE 1.14  --  1.04  --   --  1.09  --   TROPONINI 0.04*   < > 0.03* 0.12*  --   --  <0.03   < > = values in this interval not displayed.    Estimated Creatinine Clearance: 46.8 mL/min (by C-G formula based on SCr of 1.09 mg/dL).  Assessment: 84 yo man to continue coumadin for afib.  INR is therapeutic on admission INR therapeutic today Dose prior to admission = 5 mg M, Tues, Fri  7.5 mg other days  Goal of Therapy:  INR 2-3 Monitor platelets by anticoagulation protocol: Yes   Plan:  Coumadin 7.5 mg po tonight Daily PT/INR Monitor for bleeding complications  Thank you Murlean Iba, PharmD candidate  09/29/2017,9:53 AM

## 2017-09-29 NOTE — Progress Notes (Signed)
Occupational Therapy Evaluation Patient Details Name: Chad Buchanan. MRN: [id number] DOB: 08-May-1921 Today's Date: 09/29/2017  Clinical Impression: Pt is close to baseline with his ADL (limited by spinal stenosis) but has decreased safety awareness - educated on safety with bathroom focus. Pt is resistant to tub bench - but open to idea of hand held shower (he currently does not bathe often - and when he does he sponge bathes). Pt will benefit from skilled OT in the Home health setting to instruct not only in ADL and safety, for for IADL and other meaningful occupations.      09/29/17 1400  OT Visit Information  Last OT Received On 09/29/17  Assistance Needed +1  History of Present Illness 83 yo admitted with weakness, falls, SOS flu, CHF. PMhx: MI, Afib, AAA, cancer, HTN, Hyperlipidemia  Precautions  Precautions Fall;Other (comment) (droplet)  Restrictions  Weight Bearing Restrictions No  Home Living  Family/patient expects to be discharged to: Private residence  Living Arrangements  (grandson and his girlfriend)  Available Help at Discharge Available PRN/intermittently  Type of [place] to enter  Entrance Stairs-Number of Steps 12  Entrance Stairs-Rails [place] to live on main level with bedroom/bathroom;Multi-level  Bathroom Shower/Tub Tub/shower unit  Research officer, trade union - single point;Walker - 2 wheels;Shower seat;Grab bars - tub/shower  Additional Comments Pt's wife has advanced dementia, they get meals on wheels every night  Prior Function  Level of Independence Independent with assistive device(s)  Comments does not bathe often due to back pain and access to tub, meals on wheels brings dinner. Pt does lots of activities sitting down due to spinal stenosis  Communication  Communication HOH  Pain Assessment  Pain Assessment Faces  Faces Pain Scale 6  Pain Location spine - during walking  Pain  Descriptors / Indicators Constant  Pain Intervention(s) Limited activity within patient's tolerance;Monitored during session;Repositioned  Cognition  Arousal/Alertness Awake/alert  Behavior During Therapy WFL for tasks assessed/performed  Overall Cognitive Status Within Functional Limits for tasks assessed  General Comments decreased safety awareness at baseline according to daughter in law  Upper Extremity Assessment  Upper Extremity Assessment Overall WFL for tasks assessed  Lower Extremity Assessment  Lower Extremity Assessment Defer to PT evaluation  Cervical / Trunk Assessment  Cervical / Trunk Assessment Kyphotic  ADL  Overall ADL's  Needs assistance/impaired  Eating/Feeding Independent;Sitting  Grooming Modified independent;Sitting  Grooming Details (indicate cue type and reason) he does all his grooming tasks sitting at the sink due to spinal stenosis  Upper Body Bathing Set up;Sitting  Upper Body Bathing Details (indicate cue type and reason) sponge bath  Lower Body Bathing Min guard;Sit to/from stand  Upper Body Dressing  Minimal assistance;Sitting  Lower Body Dressing Minimal assistance  Toilet Transfer Min guard;Ambulation;RW;Comfort height toilet  Toilet Transfer Details (indicate cue type and reason) cues for safety and to use RW  Toileting- Water quality scientist and Hygiene Min guard;Sit to/from stand  Functional mobility during ADLs Min guard;Minimal assistance;Cueing for safety;Rolling walker  General ADL Comments Educated Pt in  the benefits of a tub bench and hand held shower, also offered to educate Pt in AE for LB ADL with his spinal stenosis, and Pt declined "I won't use any of that"  Vision- History  Patient Visual Report No change from baseline  Bed Mobility  General bed mobility comments Pt OOB in recliner when OT entered  Transfers  Overall transfer level Needs  assistance  Equipment used Rolling walker (2 wheeled)  Transfers Sit to/from Stand  Sit to  [place] transfer comment min guard for safety, good hand placement, plopped in chair   Balance  Overall balance assessment Needs assistance  Sitting-balance support No upper extremity supported  Sitting balance-[name] Fair  Standing balance support Bilateral upper extremity supported  Standing balance-[name] Poor  Standing balance comment Pt requires heavy support on RW for support and exhibits excessive forward lean  General Comments  General comments (skin integrity, edema, etc.) Talked about tub bench and hand held shower with daughter-in-law Investment banker, corporate) and Pt  OT - End of Session  Equipment Utilized During Treatment Gait belt;Rolling walker  Activity Tolerance Patient tolerated treatment well  Patient left in chair;with call bell/phone within reach;with family/visitor present  Nurse Communication Mobility status  OT Assessment  OT Recommendation/Assessment All further OT needs can be met in the next venue of care  OT Visit Diagnosis History of falling (Z91.81)  OT Problem List Impaired balance (sitting and/or standing);Decreased safety awareness;Decreased knowledge of use of DME or AE;Pain  AM-PAC OT "6 Clicks" Daily Activity Outcome Measure  Help from another person eating meals? 4  Help from another person taking care of personal grooming? 4 (seated)  Help from another person toileting, which includes using toliet, bedpan, or urinal? 3  Help from another person bathing (including washing, rinsing, drying)? 3  Help from another person to put on and taking off regular upper body clothing? 4  Help from another person to put on and taking off regular lower body clothing? 3  6 Click Score 21  ADL G Code Conversion CJ  OT Recommendation  Follow Up Recommendations Home health OT;Supervision/Assistance - 24 hour  OT Equipment Tub/shower bench;Other (comment) (hand held shower head)  Individuals Consulted  Consulted and Agree with Results and Recommendations  Patient;Family member/caregiver  Family Member Consulted Daughter in law  Acute Rehab OT Goals  Patient Stated Goal To return home  OT Goal Formulation With patient/family  Time For Goal Achievement 10/13/17  Potential to Achieve Goals Good  OT Time Calculation  OT Start Time (ACUTE ONLY) 1106  OT Stop Time (ACUTE ONLY) 1155  OT Time Calculation (min) 49 min  OT General Charges  $OT Visit 1 Visit  OT Evaluation  $OT Eval Moderate Complexity 1 Mod  OT Treatments  $Self Care/Home Management  23-37 mins  Written Expression  Dominant Hand Right  Hulda Humphrey OTR/L [phone]

## 2017-09-29 NOTE — Care Management Note (Signed)
Case Management Note  Patient Details  Name: Chad Buchanan. MRN: [id number] Date of Birth: [birth year]  Subjective/Objective:  Pt admitted with flu                   Action/Plan:  PTA from home with wife.  Pt has walker and cane in the home, PCP and denied barriers with obtaining/paying for medications.   Pts grandson lives in the lower level of the home and pt will have 24 hour after starting 5/13.   CM met with pt and daughter- in- law at bedside.  Family/pt would like for pt to discharge home instead of SNF however are open to SNF.  CM provided [facility] list and requested pt and family discuss options.     Expected Discharge Date:  09/30/17               Expected Discharge Plan:  Skilled Nursing Facility  In-House Referral:  Clinical Social Work  Discharge planning Services  CM Consult  Post Acute Care Choice:    Choice offered to:     DME Arranged:    DME Agency:     HH Arranged:    HH Agency:     Status of Service:     If discussed at [employer] [facility] [employer], dates discussed:    Additional Comments: 09/29/2017 CM provided [facility] agency choice yesterday - family chose [facility].  CM requested orders from attending and will arrange with [facility] once orders are received Maryclare Labrador, RN 09/29/2017, 3:46 PM

## 2017-09-29 NOTE — Progress Notes (Addendum)
PROGRESS NOTE  Chad Buchanan. RWE:[id number] DOB: 1921-01-15 DOA: 09/26/2017 PCP: Crist Infante, MD  HPI/Recap of past 24 hours: Chad Buchanan. is a 84 y.o. male with history of chronic diastolic CHF last EF measured in 2016 was 50 to 55%, paroxysmal atrial fibrillation, hypertension, BPH, hyperlipidemia, aortic aneurysm was brought to the ER after patient was found to be weak and had 2 falls while going to the bathroom.  Patient states he felt weak and had to sit down while going to the bathroom but did not hit his head or lose consciousness.  Has been feeling short of breath which is chronic but has mildly worsened denies any productive cough fever or chills.   ED Course: In the ER patient is found to be febrile with temperature of 101.1 F.  CT head was unremarkable CT angiogram of the chest shows pneumonia.  Patient remained hypoxic in the ER and has been admitted for further management of acute respiratory failure likely from pneumonia.  Patient denies any chest pain.  Patient's family states that patient's grandson who lives with him had influenza pneumonia last week.   09/27/17: Patient seen and examined at his bedside.  He has no new complaints.  09/28/17: very hard of hearing. Denies chest pain. Wheezing this morning. Duonebs started q6h. Added IV steroids, IV lasix 40 mg BID. C/w antibiotics and tamiflu. Updated the daughter in law.  09/29/2017 feeling better.  Breathing better.  Cough still present.  No nausea no vomiting.  Complains about constipation.  Assessment/Plan: Principal Problem:   CAP (community acquired pneumonia) Active Problems:   Atrial fibrillation, chronic (HCC)   Essential hypertension   AAA (abdominal aortic aneurysm) without rupture (HCC)   Acute respiratory failure with hypoxia (HCC)   Chronic diastolic CHF (congestive heart failure) (HCC)   Pressure injury of skin   Acute hypoxic respiratory failure secondary to community-acquired pneumonia versus  influenza A Blood cultures no growth to date  Sputum culture pending Continue Tamiflu Treated with IV azithromycin and IV ceftriaxone empirically while awaiting for the results of sputum culture Started duo nebs every 6 hours  Continue ipratropium every 2 hours as needed Started IV Solu-Medrol 60 mg 3 times daily, change to oral prednisone. Continue O2 supplementation to maintain O2 saturation 92% or greater  Chronic diastolic CHF, stable Mildly hypervolemic Started IV Lasix 40 mg twice daily, dropped to 20 mg twice daily.  IV On p.o. Lasix 40 daily at home Continue strict I's and O's and daily weight Last 2D echo revealed preserved LV EF 50 to 55%  Hypertension Blood pressure stable Continue home medications  Paroxysmal A. fib, stable Rate controlled on metoprolol Continue Coumadin INR is Therapeutic 2.24 Pharmacy monitoring Coumadin dosing  Chronic normocytic anemia Hemoglobin 9.7 from 10.3 No sign of overt bleeding Repeat CBC in the morning  CKD 2, stable Avoid nephrotoxic agents/hypotension At his baseline, stable  Aortic aneurysm, stable Denies any chest pain Continue monitoring  Ambulatory dysfunction/physical debility PT assessed and recommended SNF Fall precautions   Code Status: DNR  Family Communication: No family bedside  Disposition Plan: Home with home health when clinically stable   Consultants:  None  Procedures:  None  Antimicrobials:  IV ceftriaxone   IV azithromycin   Tamiflu  DVT prophylaxis: coumadin   Objective: Vitals:   09/29/17 0815 09/29/17 0913 09/29/17 1519 09/29/17 1639  BP: 121/78   137/69  Pulse: 80   73  Resp: 20   15  Temp: (!)  97.1 F (36.2 C)   (!) 97.3 F (36.3 C)  TempSrc: Oral   Oral  SpO2: 98% 94% 98% 94%  Weight:      Height:        Intake/Output Summary (Last 24 hours) at 09/29/2017 1745 Last data filed at 09/29/2017 1600 Gross per 24 hour  Intake 615 ml  Output 3000 ml  Net -2385 ml    Filed Weights   09/27/17 0230 09/28/17 0548 09/29/17 0542  Weight: 90 kg (198 lb 6.6 oz) 87.1 kg (192 lb 1.6 oz) 86 kg (189 lb 11.2 oz)    Exam:  . General: 84 y.o. year-old male well-developed well-nourished in no acute distress.  Very hard of hearing.   . Cardiovascular: Regular rate and rhythm with no rubs or gallops.  No thyromegaly or JVD noted.  Bilateral lower extremity trace edema. Marland Kitchen Respiratory: no rales and Occasional wheezing bilaterally.  Good inspiratory effort.   . Abdomen: Soft nontender nondistended with normal bowel sounds x4 quadrants. . Musculoskeletal: Trace edema in lower extremities. 2/4 pulses in all 4 extremities. . Skin: No ulcerative lesions noted or rashes, . Psychiatry: Mood is appropriate for condition and setting   Data Reviewed: CBC: Recent Labs  Lab 09/26/17 1437 09/27/17 0429 09/28/17 0222 09/29/17 0306  WBC 4.9 3.9* 3.4* 2.7*  NEUTROABS 4.1  --   --   --   HGB 10.9* 10.3* 9.7* 10.7*  HCT 36.2* 33.2* 32.2* 34.8*  MCV 88.1 88.3 88.5 87.4  PLT 127* 97* 105* 177*   Basic Metabolic Panel: Recent Labs  Lab 09/26/17 1437 09/26/17 2232 09/27/17 0429 09/29/17 0306  NA 136  --  137 138  K 4.6  --  4.0 3.9  CL 102  --  103 103  CO2 23  --  23 27  GLUCOSE 107*  --  90 150*  BUN 20  --  20 25*  CREATININE 1.14  --  1.04 1.09  CALCIUM 8.8*  --  8.0* 8.0*  MG  --  2.1  --   --    GFR: Estimated Creatinine Clearance: 46.8 mL/min (by C-G formula based on SCr of 1.09 mg/dL). Liver Function Tests: Recent Labs  Lab 09/26/17 1437  AST 21  ALT 11*  ALKPHOS 60  BILITOT 1.0  PROT 6.4*  ALBUMIN 3.5   No results for input(s): LIPASE, AMYLASE in the last 168 hours. No results for input(s): AMMONIA in the last 168 hours. Coagulation Profile: Recent Labs  Lab 09/26/17 1437 09/27/17 0429 09/28/17 0222 09/29/17 0306  INR 2.17 2.24 2.54 2.05   Cardiac Enzymes: Recent Labs  Lab 09/26/17 1437 09/26/17 2232 09/27/17 0429 09/27/17 0905  09/29/17 0749  TROPONINI 0.04* 0.04* 0.03* 0.12* <0.03   BNP (last 3 results) No results for input(s): PROBNP in the last 8760 hours. HbA1C: No results for input(s): HGBA1C in the last 72 hours. CBG: No results for input(s): GLUCAP in the last 168 hours. Lipid Profile: No results for input(s): CHOL, HDL, LDLCALC, TRIG, CHOLHDL, LDLDIRECT in the last 72 hours. Thyroid Function Tests: Recent Labs    09/26/17 2232  TSH 1.386   Anemia Panel: No results for input(s): VITAMINB12, FOLATE, FERRITIN, TIBC, IRON, RETICCTPCT in the last 72 hours. Urine analysis:    Component Value Date/Time   COLORURINE YELLOW 09/26/2017 1702   APPEARANCEUR HAZY (A) 09/26/2017 1702   LABSPEC 1.011 09/26/2017 1702   PHURINE 5.0 09/26/2017 1702   GLUCOSEU NEGATIVE 09/26/2017 1702   HGBUR SMALL (A) 09/26/2017  [place] 09/26/2017 [place] 09/26/2017 1702   PROTEINUR NEGATIVE 09/26/2017 1702   UROBILINOGEN 1.0 11/19/2011 1459   NITRITE NEGATIVE 09/26/2017 1702   LEUKOCYTESUR MODERATE (A) 09/26/2017 1702   Sepsis Labs: @LABRCNTIP (procalcitonin:4,lacticidven:4)  ) Recent Results (from the past 240 hour(s))  Blood Culture (routine x 2)     Status: None (Preliminary result)   Collection Time: 09/26/17  2:41 PM  Result Value Ref Range Status   Specimen Description BLOOD RIGHT HAND  Final   Special Requests   Final    BOTTLES DRAWN AEROBIC AND ANAEROBIC Blood Culture results may not be optimal due to an inadequate volume of blood received in culture bottles   Culture   Final    NO GROWTH 3 DAYS Performed at [facility], [place] [address], [place], [place] [address]    Report Status PENDING  Incomplete  Blood Culture (routine x 2)     Status: None (Preliminary result)   Collection Time: 09/26/17  2:45 PM  Result Value Ref Range Status   Specimen Description BLOOD LEFT FOREARM  Final   Special Requests   Final    BOTTLES DRAWN AEROBIC AND ANAEROBIC Blood  Culture adequate volume   Culture   Final    NO GROWTH 3 DAYS Performed at [facility], [place] [address] [place], [place] [address]    Report Status PENDING  Incomplete  Urine culture     Status: Abnormal   Collection Time: 09/26/17  9:48 PM  Result Value Ref Range Status   Specimen Description URINE, CLEAN CATCH  Final   Special Requests NONE  Final   Culture (A)  Final    <10,000 COLONIES/mL INSIGNIFICANT GROWTH Performed at [facility], [place] [address], [place], [place] [address]    Report Status 09/28/2017 FINAL  Final  MRSA PCR Screening     Status: None   Collection Time: 09/27/17  8:03 AM  Result Value Ref Range Status   MRSA by PCR NEGATIVE NEGATIVE Final    Comment:        The GeneXpert MRSA Assay (FDA approved for NASAL specimens only), is one component of a comprehensive MRSA colonization surveillance program. It is not intended to diagnose MRSA infection nor to guide or monitor treatment for MRSA infections. Performed at [facility], [place] [address] [place], [place] [address]       Studies: No results found.  Scheduled Meds: . azithromycin  500 mg Oral Daily  . finasteride  5 mg Oral QHS  . furosemide  20 mg Intravenous BID  . ipratropium-albuterol  3 mL Nebulization TID  . lisinopril  20 mg Oral Daily  . mouth rinse  15 mL Mouth Rinse BID  . metoprolol tartrate  12.5 mg Oral Daily  . oseltamivir  30 mg Oral BID  . [START ON 09/30/2017] predniSONE  50 mg Oral Q breakfast  . senna-docusate  1 tablet Oral QHS  . simvastatin  20 mg Oral QHS  . terazosin  10 mg Oral QHS  . warfarin  7.5 mg Oral ONCE-1800  . Warfarin - Pharmacist Dosing Inpatient   Does not apply q1800    Continuous Infusions: . cefTRIAXone (ROCEPHIN)  IV Stopped (09/29/17 1552)     LOS: 3 days     Berle Mull, MD [facility]  If 7PM-7AM, please contact night-coverage [email or web address] Password [facility] 09/29/2017, 5:45 PM

## 2017-09-30 LAB — CBC
HEMATOCRIT: 35.1 % — AB (ref 39.0–52.0)
Hemoglobin: 10.7 g/dL — ABNORMAL LOW (ref 13.0–17.0)
MCH: 26.6 pg (ref 26.0–34.0)
MCHC: 30.5 g/dL (ref 30.0–36.0)
MCV: 87.3 fL (ref 78.0–100.0)
PLATELETS: 118 10*3/uL — AB (ref 150–400)
RBC: 4.02 MIL/uL — ABNORMAL LOW (ref 4.22–5.81)
RDW: 15.9 % — AB (ref 11.5–15.5)
WBC: 5.6 10*3/uL (ref 4.0–10.5)

## 2017-09-30 LAB — BASIC METABOLIC PANEL
Anion gap: 6 (ref 5–15)
BUN: 32 mg/dL — AB (ref 6–20)
CO2: 31 mmol/L (ref 22–32)
CREATININE: 1.08 mg/dL (ref 0.61–1.24)
Calcium: 8.6 mg/dL — ABNORMAL LOW (ref 8.9–10.3)
Chloride: 103 mmol/L (ref 101–111)
GFR calc Af Amer: >60 mL/min (ref >60)
GFR calc non Af Amer: 57 mL/min — ABNORMAL LOW (ref >60)
GLUCOSE: 128 mg/dL — AB (ref 65–99)
POTASSIUM: 4 mmol/L (ref 3.5–5.1)
Sodium: 140 mmol/L (ref 135–145)

## 2017-09-30 LAB — MAGNESIUM: Magnesium: 2 mg/dL (ref 1.7–2.4)

## 2017-09-30 LAB — PROTIME-INR
INR: 2.43
Prothrombin Time: 26.2 seconds — ABNORMAL HIGH (ref 11.4–15.2)

## 2017-09-30 MED ORDER — PREDNISONE 10 MG PO TABS: ORAL_TABLET | ORAL | 0 refills | Status: AC

## 2017-09-30 MED ORDER — SENNOSIDES-DOCUSATE SODIUM 8.6-50 MG PO TABS: 1.0000 | ORAL_TABLET | Freq: Every evening | ORAL | 0 refills | Status: AC | PRN

## 2017-09-30 MED ORDER — OSELTAMIVIR PHOSPHATE 30 MG PO CAPS: 30.0000 mg | ORAL_CAPSULE | Freq: Two times a day (BID) | ORAL | 0 refills | Status: AC

## 2017-09-30 MED ORDER — FUROSEMIDE 40 MG PO TABS: 40.0000 mg | ORAL_TABLET | Freq: Two times a day (BID) | ORAL | 0 refills | Status: DC

## 2017-09-30 MED ORDER — FUROSEMIDE 40 MG PO TABS
40.0000 mg | ORAL_TABLET | Freq: Two times a day (BID) | ORAL | Status: DC
  Administered 2017-09-30: 40 mg via ORAL
  Filled 2017-09-30: qty 1

## 2017-09-30 MED ORDER — ALBUTEROL SULFATE (2.5 MG/3ML) 0.083% IN NEBU: 2.5000 mg | INHALATION_SOLUTION | Freq: Four times a day (QID) | RESPIRATORY_TRACT | Status: DC

## 2017-09-30 MED ORDER — FUROSEMIDE 40 MG PO TABS: 40.0000 mg | ORAL_TABLET | Freq: Every day | ORAL | 0 refills | Status: AC

## 2017-09-30 MED ORDER — ALBUTEROL SULFATE HFA 108 (90 BASE) MCG/ACT IN AERS: 1.0000 | INHALATION_SPRAY | Freq: Four times a day (QID) | RESPIRATORY_TRACT | 0 refills | Status: AC

## 2017-09-30 MED ORDER — WARFARIN SODIUM 7.5 MG PO TABS: 7.5000 mg | ORAL_TABLET | Freq: Once | ORAL | Status: DC

## 2017-09-30 NOTE — Care Management Note (Signed)
Case Management Note  Patient Details  Name: Chad Buchanan. MRN: [id number] Date of Birth: 10-07-1920  Subjective/Objective:  Pt admitted with flu                   Action/Plan:  PTA from home with wife.  Pt has walker and cane in the home, PCP and denied barriers with obtaining/paying for medications.   Pts grandson lives in the lower level of the home and pt will have 24 hour after starting 5/13.   CM met with pt and daughter- in- law at bedside.  Family/pt would like for pt to discharge home instead of SNF however are open to SNF.  CM provided [facility] list and requested pt and family discuss options.     Expected Discharge Date:  09/30/17               Expected Discharge Plan:  Skilled Nursing Facility  In-House Referral:  Clinical Social Work  Discharge planning Services  CM Consult  Post Acute Care Choice:    Choice offered to:  Adult Children  DME Arranged:    DME Agency:     HH Arranged:  RN, PT, OT, Social Work [employer] Agency:  [place]  Status of Service:  Completed, signed off  If discussed at [employer], dates discussed:    Additional Comments: 09/30/2017  CM made referral to [facility] - agency accepted referral - agency informed that pt will discharge home today.Pt will discharge home via private vehicle.  Family informed CM that they will secure 24 hour supervision for pt at discharge (combination of family and private pay PCS).    09/29/17 CM provided [facility] agency choice yesterday - family chose [facility].  CM requested orders from attending and will arrange with [facility] once orders are received Maryclare Labrador, RN 09/30/2017, 9:26 AM

## 2017-09-30 NOTE — Clinical Social Work Note (Signed)
Patient and family continue to prefer HHPT. PT has changed recommendation to reflect this.  CSW signing off. Consult again if any other social work needs arise.  [name], [place]

## 2017-09-30 NOTE — Progress Notes (Signed)
Patient discharged home with daughter, paperwork reviewed, and IV removed. Patient's questions answered. Wheeled to front entrance.

## 2017-09-30 NOTE — Care Management Important Message (Signed)
Important Message  Patient Details  Name: Chad Buchanan. MRN: [id number] Date of Birth: 05/18/1921   Medicare Important Message Given:  Yes    Maryclare Labrador, RN 09/30/2017, 11:39 AM

## 2017-09-30 NOTE — Progress Notes (Signed)
[place] for Coumadin Indication: atrial fibrillation  Allergies  Allergen Reactions  . Oxycodone Hcl Other (See Comments)    Reaction:  Somnolence   . Ultram [Tramadol Hcl] Other (See Comments)    Reaction:  Somnolence     Patient Measurements: Height: 6\' 1"  (185.4 cm) Weight: 189 lb 6.4 oz (85.9 kg) IBW/kg (Calculated) : 79.9   Vital Signs: Temp: 97.7 F (36.5 C) (05/09 0341) Temp Source: Oral (05/09 0341) BP: 159/97 (05/09 0341) Pulse Rate: 74 (05/09 0341)  Labs: Recent Labs    09/27/17 0905  09/28/17 0222 09/29/17 0306 09/29/17 0749 09/30/17 0340  HGB  --    < > 9.7* 10.7*  --  10.7*  HCT  --   --  32.2* 34.8*  --  35.1*  PLT  --   --  105* 106*  --  118*  LABPROT  --   --  27.1* 22.9*  --  26.2*  INR  --   --  2.54 2.05  --  2.43  CREATININE  --   --   --  1.09  --  1.08  TROPONINI 0.12*  --   --   --  <0.03  --    < > = values in this interval not displayed.    Estimated Creatinine Clearance: 47.3 mL/min (by C-G formula based on SCr of 1.08 mg/dL).  Assessment: 84 yo man to continue coumadin for afib.  INR is therapeutic on admission INR therapeutic today Dose prior to admission = 5 mg M, Tues, Fri  7.5 mg other days  Goal of Therapy:  INR 2-3 Monitor platelets by anticoagulation protocol: Yes   Plan:  Coumadin 7.5 mg po tonight Daily PT/INR Monitor for bleeding complications  Thank you Murlean Iba, PharmD candidate  09/30/2017,8:26 AM

## 2017-10-01 ENCOUNTER — Inpatient Hospital Stay (HOSPITAL_COMMUNITY): Admission: RE | Admit: 2017-10-01 | Payer: Commercial Managed Care - HMO | Source: Ambulatory Visit

## 2017-10-01 ENCOUNTER — Other Ambulatory Visit: Payer: Self-pay | Admitting: Internal Medicine

## 2017-10-01 ENCOUNTER — Ambulatory Visit: Payer: Medicare HMO | Admitting: Family

## 2017-10-01 DIAGNOSIS — J111 Influenza due to unidentified influenza virus with other respiratory manifestations: Secondary | ICD-10-CM

## 2017-10-01 LAB — CULTURE, BLOOD (ROUTINE X 2)
CULTURE: NO GROWTH
Culture: NO GROWTH
SPECIAL REQUESTS: ADEQUATE

## 2017-10-01 NOTE — Care Management Note (Signed)
Case Management Note  Patient Details  Name: Chad Buchanan. MRN: [id number] Date of Birth: Sep 05, 1920  Subjective/Objective:  Pt admitted with flu                   Action/Plan:  PTA from home with wife.  Pt has walker and cane in the home, PCP and denied barriers with obtaining/paying for medications.   Pts grandson lives in the lower level of the home and pt will have 24 hour after starting 5/13.   CM met with pt and daughter- in- law at bedside.  Family/pt would like for pt to discharge home instead of SNF however are open to SNF.  CM provided [facility] list and requested pt and family discuss options.     Expected Discharge Date:  09/30/17               Expected Discharge Plan:  Skilled Nursing Facility  In-House Referral:  Clinical Social Work  Discharge planning Services  CM Consult  Post Acute Care Choice:    Choice offered to:  Adult Children  DME Arranged:    DME Agency:     HH Arranged:  RN, PT, OT, Social Work [employer] Agency:  [place]  Status of Service:  Completed, signed off  If discussed at [employer], dates discussed:    Additional Comments: 10/01/2017  CM informed that [facility] SW was not added to [facility] order by attending.  Pt discharged home yesterday.  [facility] to request HHSW order from PCP.   09/30/17  CM made referral to [facility] - agency accepted referral - agency informed that pt will discharge home today.Pt will discharge home via private vehicle.  Family informed CM that they will secure 24 hour supervision for pt at discharge (combination of family and private pay PCS).    09/29/17 CM provided [facility] agency choice yesterday - family chose [facility].  CM requested orders from attending and will arrange with [facility] once orders are received Maryclare Labrador, RN 10/01/2017, 9:03 AM

## 2017-10-03 DIAGNOSIS — D631 Anemia in chronic kidney disease: Secondary | ICD-10-CM | POA: Diagnosis not present

## 2017-10-03 DIAGNOSIS — I480 Paroxysmal atrial fibrillation: Secondary | ICD-10-CM | POA: Diagnosis not present

## 2017-10-03 DIAGNOSIS — I5032 Chronic diastolic (congestive) heart failure: Secondary | ICD-10-CM | POA: Diagnosis not present

## 2017-10-03 DIAGNOSIS — Z7901 Long term (current) use of anticoagulants: Secondary | ICD-10-CM | POA: Diagnosis not present

## 2017-10-03 DIAGNOSIS — E7849 Other hyperlipidemia: Secondary | ICD-10-CM | POA: Diagnosis not present

## 2017-10-03 DIAGNOSIS — I130 Hypertensive heart and chronic kidney disease with heart failure and stage 1 through stage 4 chronic kidney disease, or unspecified chronic kidney disease: Secondary | ICD-10-CM | POA: Diagnosis not present

## 2017-10-03 DIAGNOSIS — J189 Pneumonia, unspecified organism: Secondary | ICD-10-CM | POA: Diagnosis not present

## 2017-10-03 DIAGNOSIS — N182 Chronic kidney disease, stage 2 (mild): Secondary | ICD-10-CM | POA: Diagnosis not present

## 2017-10-03 DIAGNOSIS — N400 Enlarged prostate without lower urinary tract symptoms: Secondary | ICD-10-CM | POA: Diagnosis not present

## 2017-10-03 DIAGNOSIS — E785 Hyperlipidemia, unspecified: Secondary | ICD-10-CM | POA: Diagnosis not present

## 2017-10-04 DIAGNOSIS — I5032 Chronic diastolic (congestive) heart failure: Secondary | ICD-10-CM | POA: Diagnosis not present

## 2017-10-04 DIAGNOSIS — N182 Chronic kidney disease, stage 2 (mild): Secondary | ICD-10-CM | POA: Diagnosis not present

## 2017-10-04 DIAGNOSIS — I480 Paroxysmal atrial fibrillation: Secondary | ICD-10-CM | POA: Diagnosis not present

## 2017-10-04 DIAGNOSIS — J189 Pneumonia, unspecified organism: Secondary | ICD-10-CM | POA: Diagnosis not present

## 2017-10-04 DIAGNOSIS — E785 Hyperlipidemia, unspecified: Secondary | ICD-10-CM | POA: Diagnosis not present

## 2017-10-04 DIAGNOSIS — Z7901 Long term (current) use of anticoagulants: Secondary | ICD-10-CM | POA: Diagnosis not present

## 2017-10-04 DIAGNOSIS — N400 Enlarged prostate without lower urinary tract symptoms: Secondary | ICD-10-CM | POA: Diagnosis not present

## 2017-10-04 DIAGNOSIS — D631 Anemia in chronic kidney disease: Secondary | ICD-10-CM | POA: Diagnosis not present

## 2017-10-04 DIAGNOSIS — I130 Hypertensive heart and chronic kidney disease with heart failure and stage 1 through stage 4 chronic kidney disease, or unspecified chronic kidney disease: Secondary | ICD-10-CM | POA: Diagnosis not present

## 2017-10-05 DIAGNOSIS — D649 Anemia, unspecified: Secondary | ICD-10-CM | POA: Diagnosis not present

## 2017-10-05 DIAGNOSIS — D692 Other nonthrombocytopenic purpura: Secondary | ICD-10-CM | POA: Diagnosis not present

## 2017-10-05 DIAGNOSIS — G459 Transient cerebral ischemic attack, unspecified: Secondary | ICD-10-CM | POA: Diagnosis not present

## 2017-10-05 DIAGNOSIS — N390 Urinary tract infection, site not specified: Secondary | ICD-10-CM | POA: Diagnosis not present

## 2017-10-05 DIAGNOSIS — Z0000 Encounter for general adult medical examination without abnormal findings: Secondary | ICD-10-CM | POA: Diagnosis not present

## 2017-10-05 DIAGNOSIS — I10 Essential (primary) hypertension: Secondary | ICD-10-CM | POA: Diagnosis not present

## 2017-10-05 DIAGNOSIS — K5900 Constipation, unspecified: Secondary | ICD-10-CM | POA: Diagnosis not present

## 2017-10-05 DIAGNOSIS — I714 Abdominal aortic aneurysm, without rupture: Secondary | ICD-10-CM | POA: Diagnosis not present

## 2017-10-05 DIAGNOSIS — L84 Corns and callosities: Secondary | ICD-10-CM | POA: Diagnosis not present

## 2017-10-05 DIAGNOSIS — I2510 Atherosclerotic heart disease of native coronary artery without angina pectoris: Secondary | ICD-10-CM | POA: Diagnosis not present

## 2017-10-05 DIAGNOSIS — Z7901 Long term (current) use of anticoagulants: Secondary | ICD-10-CM | POA: Diagnosis not present

## 2017-10-05 DIAGNOSIS — I480 Paroxysmal atrial fibrillation: Secondary | ICD-10-CM | POA: Diagnosis not present

## 2017-10-05 DIAGNOSIS — E7849 Other hyperlipidemia: Secondary | ICD-10-CM | POA: Diagnosis not present

## 2017-10-06 DIAGNOSIS — Z7901 Long term (current) use of anticoagulants: Secondary | ICD-10-CM | POA: Diagnosis not present

## 2017-10-06 DIAGNOSIS — I130 Hypertensive heart and chronic kidney disease with heart failure and stage 1 through stage 4 chronic kidney disease, or unspecified chronic kidney disease: Secondary | ICD-10-CM | POA: Diagnosis not present

## 2017-10-06 DIAGNOSIS — N400 Enlarged prostate without lower urinary tract symptoms: Secondary | ICD-10-CM | POA: Diagnosis not present

## 2017-10-06 DIAGNOSIS — I480 Paroxysmal atrial fibrillation: Secondary | ICD-10-CM | POA: Diagnosis not present

## 2017-10-06 DIAGNOSIS — J189 Pneumonia, unspecified organism: Secondary | ICD-10-CM | POA: Diagnosis not present

## 2017-10-06 DIAGNOSIS — N182 Chronic kidney disease, stage 2 (mild): Secondary | ICD-10-CM | POA: Diagnosis not present

## 2017-10-06 DIAGNOSIS — E785 Hyperlipidemia, unspecified: Secondary | ICD-10-CM | POA: Diagnosis not present

## 2017-10-06 DIAGNOSIS — D631 Anemia in chronic kidney disease: Secondary | ICD-10-CM | POA: Diagnosis not present

## 2017-10-06 DIAGNOSIS — I5032 Chronic diastolic (congestive) heart failure: Secondary | ICD-10-CM | POA: Diagnosis not present

## 2017-10-07 DIAGNOSIS — Z7901 Long term (current) use of anticoagulants: Secondary | ICD-10-CM | POA: Diagnosis not present

## 2017-10-08 NOTE — Discharge Summary (Signed)
[facility] Discharge Summary   Patient: Chad Buchanan. ZSW:[id number]   PCP: Crist Infante, MD DOB: 27-May-1920   Date of admission: 09/26/2017   Date of discharge: 09/30/2017    Discharge Diagnoses:  Principal Problem:   CAP (community acquired pneumonia) Active Problems:   Atrial fibrillation, chronic ([place])   Essential hypertension   AAA (abdominal aortic aneurysm) without rupture (HCC)   Acute respiratory failure with hypoxia (HCC)   Chronic diastolic CHF (congestive heart failure) ([place])   Pressure injury of skin   Admitted From: home Disposition:  Home with home health  Recommendations for Outpatient Follow-up:  1. Please follow up with PCP in 1 week   Follow-up Information    Crist Infante, MD. Schedule an appointment as soon as possible for a visit in 1 week(s).   Specialty:  Internal Medicine Contact information: [address] [place]  [phone]          Diet recommendation: cardiac diet  Activity: The patient is advised to gradually reintroduce usual activities.  Discharge Condition: good  Code Status: [id number]  History of present illness: As per the H and P dictated on admission, "Chad Buchanan. is a 84 y.o. male with history of chronic diastolic CHF last EF measured in 2016 was 50 to 55%, paroxysmal atrial fibrillation, hypertension, BPH, hyperlipidemia, aortic aneurysm was brought to the ER after patient was found to be weak and had 2 falls while going to the bathroom.  Patient states he felt weak and had to sit down while going to the bathroom but did not hit his head or lose consciousness.  Has been feeling short of breath which is chronic but has mildly worsened denies any productive cough fever or chills. "  Hospital Course:  Summary of his active problems in the hospital is as following. Acute hypoxic respiratory failure secondary to community-acquired pneumonia versus influenza A Blood cultures no growth to date  Sputum  culture pending Continue Tamiflu Treated with IV azithromycin and IV ceftriaxone empirically while awaiting for the results of sputum culture Started duo nebs every 6 hours  Started IV Solu-Medrol 60 mg 3 times daily, change to oral prednisone. Oxygenation improved on room air and pt will be discharged home on oral Antibiotics   Chronic diastolic CHF, stable Mildly hypervolemic Started IV Lasix 40 mg twice daily, dropped to 20 mg twice daily  IV. On p.o. Lasix 40 daily at home Last 2D echo revealed preserved LV EF 50 to 55% Changed back to oral lasix  Hypertension Blood pressure stable Continue home medications  Paroxysmal A. fib, stable Rate controlled on metoprolol Continue Coumadin INR is Therapeutic 2.24 Pharmacy monitoring Coumadin dosing  Chronic normocytic anemia Hemoglobin 9.7 from 10.3 No sign of overt bleeding Repeat CBC in the morning  CKD 2, stable Avoid nephrotoxic agents/hypotension At his baseline, stable  Aortic aneurysm, stable Denies any chest pain Continue monitoring  Ambulatory dysfunction/physical debility PT assessed and recommended SNF, pt wanted to go home, was able to walk 80 feet.  Fall precautions  All other chronic medical condition were stable during the hospitalization.  Patient was seen by physical therapy, who recommended SNF initially, pt wanted to go home and pt recommended hom health on reeval, which was arranged by Education officer, museum and case Freight forwarder. On the day of the discharge the patient's vitals were stable, and no other acute medical condition were reported by patient. the patient was felt safe to be discharge at home with home health.  Consultants: noen Procedures: none  DISCHARGE MEDICATION: Allergies as of 09/30/2017      Reactions   Oxycodone Hcl Other (See Comments)   Reaction:  Somnolence    Ultram [tramadol Hcl] Other (See Comments)   Reaction:  Somnolence       Medication List    TAKE these medications     albuterol 108 (90 Base) MCG/ACT inhaler Commonly known as:  PROVENTIL HFA;VENTOLIN HFA Inhale 1 puff into the lungs 4 (four) times daily.   finasteride 5 MG tablet Commonly known as:  PROSCAR Take 5 mg by mouth at bedtime.   furosemide 40 MG tablet Commonly known as:  LASIX Take 1 tablet (40 mg total) by mouth daily.   lisinopril 20 MG tablet Commonly known as:  PRINIVIL,ZESTRIL Take 20 mg by mouth daily.   metoprolol tartrate 25 MG tablet Commonly known as:  LOPRESSOR Take 12.5 mg by mouth daily.   predniSONE 10 MG tablet Commonly known as:  DELTASONE Take 40mg  daily for 3days,Take 30mg  daily for 3days,Take 20mg  daily for 3days,Take 10mg  daily for 3days, then stop.   senna-docusate 8.6-50 MG tablet Commonly known as:  Senokot-S Take 1 tablet by mouth at bedtime as needed for mild constipation.   simvastatin 40 MG tablet Commonly known as:  ZOCOR Take 20 mg by mouth at bedtime.   terazosin 10 MG capsule Commonly known as:  HYTRIN Take 10 mg by mouth at bedtime.   warfarin 5 MG tablet Commonly known as:  COUMADIN Take 5-7.5 mg by mouth every evening. Pt take  5 mg  tablet on Monday, Tues, and Friday.   Pt take  7.5 mg on Wednesday Thursday, Saturday, and Sunday     ASK your doctor about these medications   oseltamivir 30 MG capsule Commonly known as:  TAMIFLU Take 1 capsule (30 mg total) by mouth 2 (two) times daily for 1 day. Ask about: Should I take this medication?      Allergies  Allergen Reactions  . Oxycodone Hcl Other (See Comments)    Reaction:  Somnolence   . Ultram [Tramadol Hcl] Other (See Comments)    Reaction:  Somnolence    Discharge Instructions    Diet - low sodium heart healthy   Complete by:  As directed    Discharge instructions   Complete by:  As directed    It is important that you read following instructions as well as go over your medication list with RN to help you understand your care after this hospitalization.  Discharge  Instructions: Please follow-up with PCP in one week  Please request your primary care physician to go over all Hospital Tests and Procedure/Radiological results at the follow up,  Please get all Hospital records sent to your PCP by signing hospital release before you go home.   Do not take more than prescribed Pain, Sleep and Anxiety Medications. You were cared for by a hospitalist during your hospital stay. If you have any questions about your discharge medications or the care you received while you were in the hospital after you are discharged, you can call the unit and ask to speak with the hospitalist on call if the hospitalist that took care of you is not available.  Once you are discharged, your primary care physician will handle any further medical issues. Please note that NO REFILLS for any discharge medications will be authorized once you are discharged, as it is imperative that you return to your primary care physician (or establish a  relationship with a primary care physician if you do not have one) for your aftercare needs so that they can reassess your need for medications and monitor your lab values. You Must read complete instructions/literature along with all the possible adverse reactions/side effects for all the Medicines you take and that have been prescribed to you. Take any new Medicines after you have completely understood and accept all the possible adverse reactions/side effects. Wear Seat belts while driving. If you have smoked or chewed Tobacco in the last 2 yrs please stop smoking and/or stop any Recreational drug use.   Increase activity slowly   Complete by:  As directed      Discharge Exam: Filed Weights   09/28/17 0548 09/29/17 0542 09/30/17 0543  Weight: 87.1 kg (192 lb 1.6 oz) 86 kg (189 lb 11.2 oz) 85.9 kg (189 lb 6.4 oz)   Vitals:   09/30/17 0845 09/30/17 0846  BP: (!) 153/105 (!) 149/79  Pulse: 85 82  Resp: (!) 21 18  Temp: (!) 97.5 F (36.4 C)   SpO2:  93% 99%   General: Appear in no distress, no Rash; Oral Mucosa moist. Cardiovascular: S1 and S2 Present, no Murmur, no JVD Respiratory: Bilateral Air entry present and Clear to Auscultation, no Crackles, no wheezes Abdomen: Bowel Sound present, Soft and no tenderness Extremities: no Pedal edema, no calf tenderness Neurology: Grossly no focal neuro deficit.  The results of significant diagnostics from this hospitalization (including imaging, microbiology, ancillary and laboratory) are listed below for reference.    Significant Diagnostic Studies: Ct Head Wo Contrast  Result Date: 09/26/2017 CLINICAL DATA:  Increased weakness for 2 days. Shortness of breath. Febrile. EXAM: CT HEAD WITHOUT CONTRAST TECHNIQUE: Contiguous axial images were obtained from the base of the skull through the vertex without intravenous contrast. COMPARISON:  None. FINDINGS: Brain: No evidence of acute infarction, hemorrhage, hydrocephalus, extra-axial collection or mass lesion/mass effect. S phone lesion in the right cerebellar cortex, likely due to prior ischemic infarction. Advanced brain parenchymal volume loss and deep white matter microangiopathy. Vascular: Calcific atherosclerotic disease at the skull base. Skull: Normal. Negative for fracture or focal lesion. Sinuses/Orbits: Mild polypoid mucosal thickening of bilateral ethmoid, frontal and right maxillary sinuses. Other: None. IMPRESSION: No acute intracranial abnormality. Advanced brain parenchymal atrophy and chronic microvascular disease. Remote right cerebellar ischemic infarct. Chronic sinusitis. Electronically Signed   By: Fidela Salisbury M.D.   On: 09/26/2017 18:16   Ct Angio Chest Pe W/cm &/or Wo Cm  Result Date: 09/26/2017 CLINICAL DATA:  Increased weakness x2 days. Patient fell today. Fever and dyspnea. EXAM: CT ANGIOGRAPHY CHEST WITH CONTRAST TECHNIQUE: Multidetector CT imaging of the chest was performed using the standard protocol during bolus  administration of intravenous contrast. Multiplanar CT image reconstructions and MIPs were obtained to evaluate the vascular anatomy. CONTRAST:  152mL ISOVUE-370 IOPAMIDOL (ISOVUE-370) INJECTION 76% COMPARISON:  Same day CXR and 03/11/2015 CXR FINDINGS: Cardiovascular: No large central pulmonary embolus allowing for motion related artifacts limiting assessment of the more distal segmental pulmonary arteries. Satisfactory opacification of the pulmonary arteries to the proximal segmental levels. There is cardiomegaly with left main and three-vessel coronary arteriosclerosis. Moderate aortic atherosclerosis with 4.6 cm aneurysmal dilatation of the ascending aorta. No definite evidence of dissection though there is preferential opacification of the pulmonary arteries on this study. Mediastinum/Nodes: Small subcentimeter and bilateral hilar lymph nodes likely reactive. Trachea mainstem bronchi are patent. No acute esophageal abnormality. No thyromegaly or mass. Lungs/Pleura: Limited by motion artifacts. Faint bilateral airspace  opacity superimposed on subpleural chronic interstitial lung disease and fibrosis is identified, multilobar a more confluent in the right upper and lower lobes. Trace bilateral pleural effusions. No identified dominant mass. Upper Abdomen: Bilateral upper pole water attenuating cysts of both kidneys, largest approximately 6.4 cm medially in the right upper pole. The unenhanced liver is unremarkable. There is no splenomegaly. No acute abnormality within the upper abdomen. Musculoskeletal: No aggressive osseous lesions. Thoracic spondylosis. Review of the MIP images confirms the above findings. IMPRESSION: 1. Chronic interstitial lung disease with bilateral subpleural areas of reticular fibrosis with superimposed bilateral multilobar airspace opacities predominantly right upper and lower lobes suggesting superimposed infection/pneumonia. Small reactive lymph nodes the mediastinum and hila. Trace  bilateral pleural effusions. 2. No large central pulmonary embolus given limitations of respiratory motion artifact. 3. Aortic atherosclerosis and coronary arteriosclerosis. 4. 4.6 cm ascending thoracic aortic aneurysm. No dissection. Ascending thoracic aortic aneurysm. Recommend semi-annual imaging followup by CTA or MRA and referral to cardiothoracic surgery if not already obtained. This recommendation follows 2010 ACCF/AHA/AATS/ACR/ASA/SCA/SCAI/SIR/STS/SVM Guidelines for the Diagnosis and Management of Patients With Thoracic Aortic Disease. Circulation. 2010; 121: e266-e369 5. Multilevel degenerative disc and endplate changes compatible thoracic spondylosis. No aggressive osseous lesions. Aortic aneurysm NOS (ICD10-I71.9). Aortic Atherosclerosis (ICD10-I70.0). Electronically Signed   By: Ashley Royalty M.D.   On: 09/26/2017 18:39   Dg Chest Port 1 View  Result Date: 09/26/2017 CLINICAL DATA:  Weakness EXAM: PORTABLE CHEST 1 VIEW COMPARISON:  03/11/2015 chest radiograph. FINDINGS: Stable cardiomediastinal silhouette with mild cardiomegaly. No pneumothorax. No pleural effusion. Patchy reticular opacities throughout both lungs, not appreciably changed. No superimposed acute consolidative airspace disease. IMPRESSION: 1. No appreciable change in patchy reticular opacities throughout both lungs, which may indicate chronic interstitial lung disease. 2. No acute superimposed consolidative airspace disease. 3. Stable mild cardiomegaly. Electronically Signed   By: Ilona Sorrel M.D.   On: 09/26/2017 15:26    Microbiology: No results found for this or any previous visit (from the past 240 hour(s)).   Labs: CBC: No results for input(s): WBC, NEUTROABS, HGB, HCT, MCV, PLT in the last 168 hours. Basic Metabolic Panel: No results for input(s): NA, K, CL, CO2, GLUCOSE, BUN, CREATININE, CALCIUM, MG, PHOS in the last 168 hours. Liver Function Tests: No results for input(s): AST, ALT, ALKPHOS, BILITOT, PROT, ALBUMIN in  the last 168 hours. No results for input(s): LIPASE, AMYLASE in the last 168 hours. No results for input(s): AMMONIA in the last 168 hours. Cardiac Enzymes: No results for input(s): CKTOTAL, CKMB, CKMBINDEX, TROPONINI in the last 168 hours. BNP (last 3 results) Recent Labs    09/26/17 1447  BNP 287.0*   CBG: No results for input(s): GLUCAP in the last 168 hours. Time spent: 35 minutes  Signed:  Berle Mull  [facility] 09/30/2017 , 11:57 AM

## 2017-10-11 DIAGNOSIS — Z7901 Long term (current) use of anticoagulants: Secondary | ICD-10-CM | POA: Diagnosis not present

## 2017-10-12 DIAGNOSIS — E785 Hyperlipidemia, unspecified: Secondary | ICD-10-CM | POA: Diagnosis not present

## 2017-10-12 DIAGNOSIS — N400 Enlarged prostate without lower urinary tract symptoms: Secondary | ICD-10-CM | POA: Diagnosis not present

## 2017-10-12 DIAGNOSIS — I480 Paroxysmal atrial fibrillation: Secondary | ICD-10-CM | POA: Diagnosis not present

## 2017-10-12 DIAGNOSIS — Z7901 Long term (current) use of anticoagulants: Secondary | ICD-10-CM | POA: Diagnosis not present

## 2017-10-12 DIAGNOSIS — I5032 Chronic diastolic (congestive) heart failure: Secondary | ICD-10-CM | POA: Diagnosis not present

## 2017-10-12 DIAGNOSIS — N182 Chronic kidney disease, stage 2 (mild): Secondary | ICD-10-CM | POA: Diagnosis not present

## 2017-10-12 DIAGNOSIS — D631 Anemia in chronic kidney disease: Secondary | ICD-10-CM | POA: Diagnosis not present

## 2017-10-12 DIAGNOSIS — J189 Pneumonia, unspecified organism: Secondary | ICD-10-CM | POA: Diagnosis not present

## 2017-10-12 DIAGNOSIS — I130 Hypertensive heart and chronic kidney disease with heart failure and stage 1 through stage 4 chronic kidney disease, or unspecified chronic kidney disease: Secondary | ICD-10-CM | POA: Diagnosis not present

## 2017-10-14 DIAGNOSIS — I5032 Chronic diastolic (congestive) heart failure: Secondary | ICD-10-CM | POA: Diagnosis not present

## 2017-10-14 DIAGNOSIS — N400 Enlarged prostate without lower urinary tract symptoms: Secondary | ICD-10-CM | POA: Diagnosis not present

## 2017-10-14 DIAGNOSIS — Z7901 Long term (current) use of anticoagulants: Secondary | ICD-10-CM | POA: Diagnosis not present

## 2017-10-14 DIAGNOSIS — J189 Pneumonia, unspecified organism: Secondary | ICD-10-CM | POA: Diagnosis not present

## 2017-10-14 DIAGNOSIS — I130 Hypertensive heart and chronic kidney disease with heart failure and stage 1 through stage 4 chronic kidney disease, or unspecified chronic kidney disease: Secondary | ICD-10-CM | POA: Diagnosis not present

## 2017-10-14 DIAGNOSIS — E785 Hyperlipidemia, unspecified: Secondary | ICD-10-CM | POA: Diagnosis not present

## 2017-10-14 DIAGNOSIS — N182 Chronic kidney disease, stage 2 (mild): Secondary | ICD-10-CM | POA: Diagnosis not present

## 2017-10-14 DIAGNOSIS — D631 Anemia in chronic kidney disease: Secondary | ICD-10-CM | POA: Diagnosis not present

## 2017-10-14 DIAGNOSIS — I480 Paroxysmal atrial fibrillation: Secondary | ICD-10-CM | POA: Diagnosis not present

## 2017-10-19 DIAGNOSIS — N182 Chronic kidney disease, stage 2 (mild): Secondary | ICD-10-CM | POA: Diagnosis not present

## 2017-10-19 DIAGNOSIS — J189 Pneumonia, unspecified organism: Secondary | ICD-10-CM | POA: Diagnosis not present

## 2017-10-19 DIAGNOSIS — E785 Hyperlipidemia, unspecified: Secondary | ICD-10-CM | POA: Diagnosis not present

## 2017-10-19 DIAGNOSIS — I130 Hypertensive heart and chronic kidney disease with heart failure and stage 1 through stage 4 chronic kidney disease, or unspecified chronic kidney disease: Secondary | ICD-10-CM | POA: Diagnosis not present

## 2017-10-19 DIAGNOSIS — I5032 Chronic diastolic (congestive) heart failure: Secondary | ICD-10-CM | POA: Diagnosis not present

## 2017-10-19 DIAGNOSIS — Z7901 Long term (current) use of anticoagulants: Secondary | ICD-10-CM | POA: Diagnosis not present

## 2017-10-19 DIAGNOSIS — D631 Anemia in chronic kidney disease: Secondary | ICD-10-CM | POA: Diagnosis not present

## 2017-10-19 DIAGNOSIS — N400 Enlarged prostate without lower urinary tract symptoms: Secondary | ICD-10-CM | POA: Diagnosis not present

## 2017-10-19 DIAGNOSIS — I480 Paroxysmal atrial fibrillation: Secondary | ICD-10-CM | POA: Diagnosis not present

## 2017-10-25 DIAGNOSIS — I480 Paroxysmal atrial fibrillation: Secondary | ICD-10-CM | POA: Diagnosis not present

## 2017-10-25 DIAGNOSIS — Z7901 Long term (current) use of anticoagulants: Secondary | ICD-10-CM | POA: Diagnosis not present

## 2017-10-26 DIAGNOSIS — E785 Hyperlipidemia, unspecified: Secondary | ICD-10-CM | POA: Diagnosis not present

## 2017-10-26 DIAGNOSIS — D631 Anemia in chronic kidney disease: Secondary | ICD-10-CM | POA: Diagnosis not present

## 2017-10-26 DIAGNOSIS — N182 Chronic kidney disease, stage 2 (mild): Secondary | ICD-10-CM | POA: Diagnosis not present

## 2017-10-26 DIAGNOSIS — I480 Paroxysmal atrial fibrillation: Secondary | ICD-10-CM | POA: Diagnosis not present

## 2017-10-26 DIAGNOSIS — J189 Pneumonia, unspecified organism: Secondary | ICD-10-CM | POA: Diagnosis not present

## 2017-10-26 DIAGNOSIS — Z7901 Long term (current) use of anticoagulants: Secondary | ICD-10-CM | POA: Diagnosis not present

## 2017-10-26 DIAGNOSIS — N400 Enlarged prostate without lower urinary tract symptoms: Secondary | ICD-10-CM | POA: Diagnosis not present

## 2017-10-26 DIAGNOSIS — I130 Hypertensive heart and chronic kidney disease with heart failure and stage 1 through stage 4 chronic kidney disease, or unspecified chronic kidney disease: Secondary | ICD-10-CM | POA: Diagnosis not present

## 2017-10-26 DIAGNOSIS — I5032 Chronic diastolic (congestive) heart failure: Secondary | ICD-10-CM | POA: Diagnosis not present

## 2017-11-01 DIAGNOSIS — Z6830 Body mass index (BMI) 30.0-30.9, adult: Secondary | ICD-10-CM | POA: Diagnosis not present

## 2017-11-01 DIAGNOSIS — I2510 Atherosclerotic heart disease of native coronary artery without angina pectoris: Secondary | ICD-10-CM | POA: Diagnosis not present

## 2017-11-01 DIAGNOSIS — I509 Heart failure, unspecified: Secondary | ICD-10-CM | POA: Diagnosis not present

## 2017-11-01 DIAGNOSIS — I10 Essential (primary) hypertension: Secondary | ICD-10-CM | POA: Diagnosis not present

## 2017-11-01 DIAGNOSIS — I480 Paroxysmal atrial fibrillation: Secondary | ICD-10-CM | POA: Diagnosis not present

## 2017-11-02 DIAGNOSIS — D689 Coagulation defect, unspecified: Secondary | ICD-10-CM | POA: Diagnosis not present

## 2017-11-02 DIAGNOSIS — B351 Tinea unguium: Secondary | ICD-10-CM | POA: Diagnosis not present

## 2017-11-02 DIAGNOSIS — M79676 Pain in unspecified toe(s): Secondary | ICD-10-CM | POA: Diagnosis not present

## 2017-11-02 DIAGNOSIS — I739 Peripheral vascular disease, unspecified: Secondary | ICD-10-CM | POA: Diagnosis not present

## 2017-11-02 DIAGNOSIS — Q828 Other specified congenital malformations of skin: Secondary | ICD-10-CM | POA: Diagnosis not present

## 2017-11-04 DIAGNOSIS — D631 Anemia in chronic kidney disease: Secondary | ICD-10-CM | POA: Diagnosis not present

## 2017-11-04 DIAGNOSIS — J189 Pneumonia, unspecified organism: Secondary | ICD-10-CM | POA: Diagnosis not present

## 2017-11-04 DIAGNOSIS — I480 Paroxysmal atrial fibrillation: Secondary | ICD-10-CM | POA: Diagnosis not present

## 2017-11-04 DIAGNOSIS — E785 Hyperlipidemia, unspecified: Secondary | ICD-10-CM | POA: Diagnosis not present

## 2017-11-04 DIAGNOSIS — I5032 Chronic diastolic (congestive) heart failure: Secondary | ICD-10-CM | POA: Diagnosis not present

## 2017-11-04 DIAGNOSIS — N400 Enlarged prostate without lower urinary tract symptoms: Secondary | ICD-10-CM | POA: Diagnosis not present

## 2017-11-04 DIAGNOSIS — I130 Hypertensive heart and chronic kidney disease with heart failure and stage 1 through stage 4 chronic kidney disease, or unspecified chronic kidney disease: Secondary | ICD-10-CM | POA: Diagnosis not present

## 2017-11-04 DIAGNOSIS — N182 Chronic kidney disease, stage 2 (mild): Secondary | ICD-10-CM | POA: Diagnosis not present

## 2017-11-04 DIAGNOSIS — Z7901 Long term (current) use of anticoagulants: Secondary | ICD-10-CM | POA: Diagnosis not present

## 2017-11-09 ENCOUNTER — Encounter: Payer: Self-pay | Admitting: Podiatry

## 2017-11-09 ENCOUNTER — Ambulatory Visit (INDEPENDENT_AMBULATORY_CARE_PROVIDER_SITE_OTHER): Payer: Medicare HMO | Admitting: Podiatry

## 2017-11-09 DIAGNOSIS — I739 Peripheral vascular disease, unspecified: Secondary | ICD-10-CM

## 2017-11-09 DIAGNOSIS — D689 Coagulation defect, unspecified: Secondary | ICD-10-CM | POA: Diagnosis not present

## 2017-11-09 DIAGNOSIS — M79676 Pain in unspecified toe(s): Secondary | ICD-10-CM | POA: Diagnosis not present

## 2017-11-09 DIAGNOSIS — B351 Tinea unguium: Secondary | ICD-10-CM | POA: Diagnosis not present

## 2017-11-09 DIAGNOSIS — Q828 Other specified congenital malformations of skin: Secondary | ICD-10-CM | POA: Diagnosis not present

## 2017-11-09 NOTE — Progress Notes (Signed)
Complaint:  Visit Type: Patient returns to my office for continued preventative foot care services. Complaint: Patient states" my nails have grown long and thick and become painful to walk and wear shoes" Patient has been diagnosed with spinal stenosis. The patient presents for preventative foot care services. No changes to ROS.  Patient has painful callus left forefoot.  Podiatric Exam: Vascular: dorsalis pedis and posterior tibial pulses are palpable bilateral. Capillary return is immediate. Temperature gradient is WNL. Skin turgor WNL  Sensorium: Normal Semmes Weinstein monofilament test. Normal tactile sensation bilaterally. Nail Exam: Pt has thick disfigured discolored nails with subungual debris noted bilateral entire nail hallux through fifth toenails Ulcer Exam: There is no evidence of ulcer or pre-ulcerative changes or infection. Orthopedic Exam: Muscle tone and strength are WNL. No limitations in general ROM. No crepitus or effusions noted. Foot type and digits show no abnormalities. Bony prominences are unremarkable. Skin: No Porokeratosis. No infection or ulcers.  Callus left forefoot.  Diagnosis:  Onychomycosis, , Pain in right toe, pain in left toes,  Porokeratosis left forefoot.  Treatment & Plan Procedures and Treatment: Consent by patient was obtained for treatment procedures.   Debridement of mycotic and hypertrophic toenails, 1 through 5 bilateral and clearing of subungual debris. No ulceration, no infection noted. Debride porokeratosis left  Return Visit-Office Procedure: Patient instructed to return to the office for a follow up visit 10 weeks  for continued evaluation and treatment.    Gardiner Barefoot DPM

## 2017-12-02 DIAGNOSIS — I509 Heart failure, unspecified: Secondary | ICD-10-CM | POA: Diagnosis not present

## 2017-12-02 DIAGNOSIS — Z6829 Body mass index (BMI) 29.0-29.9, adult: Secondary | ICD-10-CM | POA: Diagnosis not present

## 2017-12-02 DIAGNOSIS — Z7901 Long term (current) use of anticoagulants: Secondary | ICD-10-CM | POA: Diagnosis not present

## 2017-12-02 DIAGNOSIS — I480 Paroxysmal atrial fibrillation: Secondary | ICD-10-CM | POA: Diagnosis not present

## 2017-12-02 DIAGNOSIS — I10 Essential (primary) hypertension: Secondary | ICD-10-CM | POA: Diagnosis not present

## 2017-12-30 DIAGNOSIS — Z7901 Long term (current) use of anticoagulants: Secondary | ICD-10-CM | POA: Diagnosis not present

## 2017-12-30 DIAGNOSIS — I480 Paroxysmal atrial fibrillation: Secondary | ICD-10-CM | POA: Diagnosis not present

## 2018-01-17 DIAGNOSIS — I10 Essential (primary) hypertension: Secondary | ICD-10-CM | POA: Diagnosis not present

## 2018-01-17 DIAGNOSIS — I480 Paroxysmal atrial fibrillation: Secondary | ICD-10-CM | POA: Diagnosis not present

## 2018-01-17 DIAGNOSIS — I509 Heart failure, unspecified: Secondary | ICD-10-CM | POA: Diagnosis not present

## 2018-01-17 DIAGNOSIS — D6489 Other specified anemias: Secondary | ICD-10-CM | POA: Diagnosis not present

## 2018-01-17 DIAGNOSIS — Z6828 Body mass index (BMI) 28.0-28.9, adult: Secondary | ICD-10-CM | POA: Diagnosis not present

## 2018-01-17 DIAGNOSIS — Z23 Encounter for immunization: Secondary | ICD-10-CM | POA: Diagnosis not present

## 2018-01-17 DIAGNOSIS — R7301 Impaired fasting glucose: Secondary | ICD-10-CM | POA: Diagnosis not present

## 2018-01-18 ENCOUNTER — Encounter: Payer: Self-pay | Admitting: Podiatry

## 2018-01-18 ENCOUNTER — Ambulatory Visit: Payer: Medicare HMO | Admitting: Podiatry

## 2018-01-18 DIAGNOSIS — D689 Coagulation defect, unspecified: Secondary | ICD-10-CM

## 2018-01-18 DIAGNOSIS — M79676 Pain in unspecified toe(s): Secondary | ICD-10-CM | POA: Diagnosis not present

## 2018-01-18 DIAGNOSIS — Q828 Other specified congenital malformations of skin: Secondary | ICD-10-CM

## 2018-01-18 DIAGNOSIS — I739 Peripheral vascular disease, unspecified: Secondary | ICD-10-CM

## 2018-01-18 DIAGNOSIS — B351 Tinea unguium: Secondary | ICD-10-CM | POA: Diagnosis not present

## 2018-01-18 NOTE — Progress Notes (Signed)
Complaint:  Visit Type: Patient returns to my office for continued preventative foot care services. Complaint: Patient states" my nails have grown long and thick and become painful to walk and wear shoes" Patient has been diagnosed with spinal stenosis. The patient presents for preventative foot care services. No changes to ROS.  Patient has painful callus left forefoot..  Patient is taking plavix. Podiatric Exam: Vascular: dorsalis pedis and posterior tibial pulses are palpable bilateral. Capillary return is immediate. Temperature gradient is WNL. Skin turgor WNL  Sensorium: Normal Semmes Weinstein monofilament test. Normal tactile sensation bilaterally. Nail Exam: Pt has thick disfigured discolored nails with subungual debris noted bilateral entire nail hallux through fifth toenails Ulcer Exam: There is no evidence of ulcer or pre-ulcerative changes or infection. Orthopedic Exam: Muscle tone and strength are WNL. No limitations in general ROM. No crepitus or effusions noted. Foot type and digits show no abnormalities. Bony prominences are unremarkable. Skin: No Porokeratosis. No infection or ulcers.  Callus left forefoot.  Diagnosis:  Onychomycosis, , Pain in right toe, pain in left toes,  Porokeratosis left forefoot.  Treatment & Plan Procedures and Treatment: Consent by patient was obtained for treatment procedures.   Debridement of mycotic and hypertrophic toenails, 1 through 5 bilateral and clearing of subungual debris. No ulceration, no infection noted. Debride porokeratosis left .  Iatrogenic lesion left foot. Return Visit-Office Procedure: Patient instructed to return to the office for a follow up visit 10 weeks  for continued evaluation and treatment.    Toshua Honsinger DPM 

## 2018-02-10 DIAGNOSIS — I480 Paroxysmal atrial fibrillation: Secondary | ICD-10-CM | POA: Diagnosis not present

## 2018-02-10 DIAGNOSIS — Z7901 Long term (current) use of anticoagulants: Secondary | ICD-10-CM | POA: Diagnosis not present

## 2018-03-09 DIAGNOSIS — Z7901 Long term (current) use of anticoagulants: Secondary | ICD-10-CM | POA: Diagnosis not present

## 2018-03-09 DIAGNOSIS — I480 Paroxysmal atrial fibrillation: Secondary | ICD-10-CM | POA: Diagnosis not present

## 2018-03-21 DIAGNOSIS — H35033 Hypertensive retinopathy, bilateral: Secondary | ICD-10-CM | POA: Diagnosis not present

## 2018-03-21 DIAGNOSIS — H02132 Senile ectropion of right lower eyelid: Secondary | ICD-10-CM | POA: Diagnosis not present

## 2018-03-21 DIAGNOSIS — H35373 Puckering of macula, bilateral: Secondary | ICD-10-CM | POA: Diagnosis not present

## 2018-03-21 DIAGNOSIS — Z961 Presence of intraocular lens: Secondary | ICD-10-CM | POA: Diagnosis not present

## 2018-03-30 ENCOUNTER — Encounter: Payer: Self-pay | Admitting: Podiatry

## 2018-03-30 ENCOUNTER — Ambulatory Visit: Payer: Medicare HMO | Admitting: Podiatry

## 2018-03-30 DIAGNOSIS — D689 Coagulation defect, unspecified: Secondary | ICD-10-CM

## 2018-03-30 DIAGNOSIS — B351 Tinea unguium: Secondary | ICD-10-CM | POA: Diagnosis not present

## 2018-03-30 DIAGNOSIS — I739 Peripheral vascular disease, unspecified: Secondary | ICD-10-CM | POA: Diagnosis not present

## 2018-03-30 DIAGNOSIS — M79676 Pain in unspecified toe(s): Secondary | ICD-10-CM | POA: Diagnosis not present

## 2018-03-30 DIAGNOSIS — Q828 Other specified congenital malformations of skin: Secondary | ICD-10-CM

## 2018-03-30 NOTE — Progress Notes (Signed)
Complaint:  Visit Type: Patient returns to my office for continued preventative foot care services. Complaint: Patient states" my nails have grown long and thick and become painful to walk and wear shoes" Patient has been diagnosed with spinal stenosis. The patient presents for preventative foot care services. No changes to ROS.  Patient has painful callus left forefoot..  Patient is taking plavix. Podiatric Exam: Vascular: dorsalis pedis and posterior tibial pulses are palpable bilateral. Capillary return is immediate. Temperature gradient is WNL. Skin turgor WNL  Sensorium: Normal Semmes Weinstein monofilament test. Normal tactile sensation bilaterally. Nail Exam: Pt has thick disfigured discolored nails with subungual debris noted bilateral entire nail hallux through fifth toenails Ulcer Exam: There is no evidence of ulcer or pre-ulcerative changes or infection. Orthopedic Exam: Muscle tone and strength are WNL. No limitations in general ROM. No crepitus or effusions noted. Foot type and digits show no abnormalities. Bony prominences are unremarkable. Skin: No Porokeratosis. No infection or ulcers.  Callus left forefoot.  Diagnosis:  Onychomycosis, , Pain in right toe, pain in left toes,  Porokeratosis left forefoot.  Treatment & Plan Procedures and Treatment: Consent by patient was obtained for treatment procedures.   Debridement of mycotic and hypertrophic toenails, 1 through 5 bilateral and clearing of subungual debris. No ulceration, no infection noted. Debride porokeratosis left .  Iatrogenic lesion left foot. Return Visit-Office Procedure: Patient instructed to return to the office for a follow up visit 10 weeks  for continued evaluation and treatment.    Deondrick Searls DPM 

## 2018-04-06 DIAGNOSIS — I480 Paroxysmal atrial fibrillation: Secondary | ICD-10-CM | POA: Diagnosis not present

## 2018-04-06 DIAGNOSIS — Z7901 Long term (current) use of anticoagulants: Secondary | ICD-10-CM | POA: Diagnosis not present

## 2018-05-06 DIAGNOSIS — I480 Paroxysmal atrial fibrillation: Secondary | ICD-10-CM | POA: Diagnosis not present

## 2018-05-06 DIAGNOSIS — Z7901 Long term (current) use of anticoagulants: Secondary | ICD-10-CM | POA: Diagnosis not present

## 2018-06-08 DIAGNOSIS — Z6829 Body mass index (BMI) 29.0-29.9, adult: Secondary | ICD-10-CM | POA: Diagnosis not present

## 2018-06-08 DIAGNOSIS — I480 Paroxysmal atrial fibrillation: Secondary | ICD-10-CM | POA: Diagnosis not present

## 2018-06-08 DIAGNOSIS — I10 Essential (primary) hypertension: Secondary | ICD-10-CM | POA: Diagnosis not present

## 2018-06-08 DIAGNOSIS — I509 Heart failure, unspecified: Secondary | ICD-10-CM | POA: Diagnosis not present

## 2018-06-08 DIAGNOSIS — L84 Corns and callosities: Secondary | ICD-10-CM | POA: Diagnosis not present

## 2018-06-08 IMAGING — CT CT HEAD W/O CM
4 series · 15 of 47 positions shown, 17 images · non-contrast
Comparison: None.

CLINICAL DATA: Increased weakness for 2 days. Shortness of breath.
Febrile.

EXAM:
CT HEAD WITHOUT CONTRAST
TECHNIQUE: Contiguous axial images were obtained from the base of the skull
through the vertex without intravenous contrast.

[Series 3: head wo · axial · 0.48mm/px · z∈[-166,-42]mm · 7 of 35 slices shown, 9 images]
[im 5/35  brain]
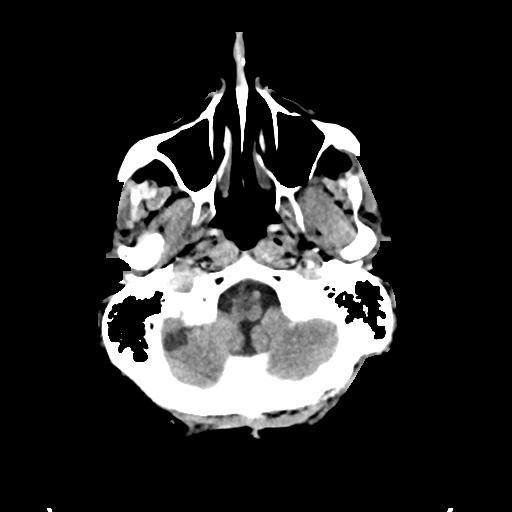
[im 5/35  bone]
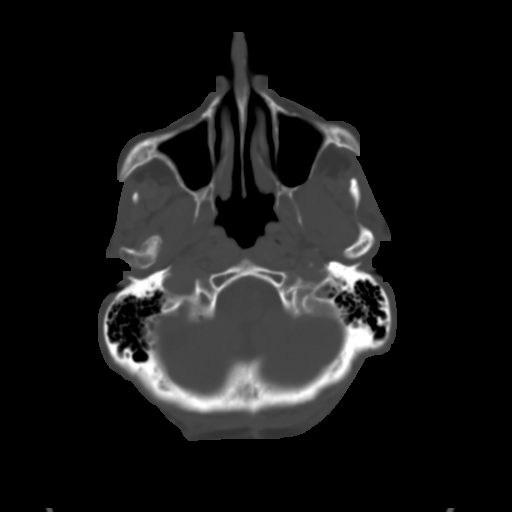
[im 9/35  brain]
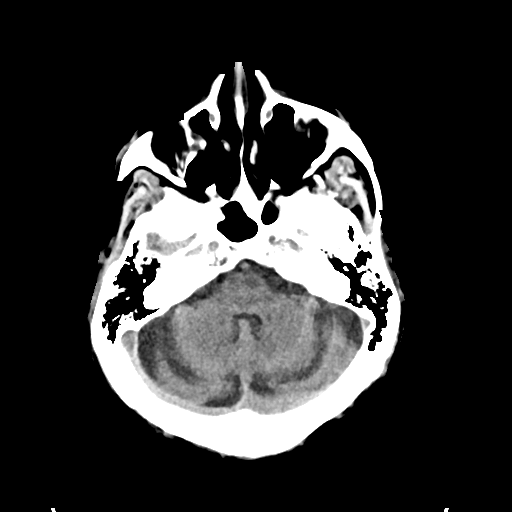
[im 13/35  brain]
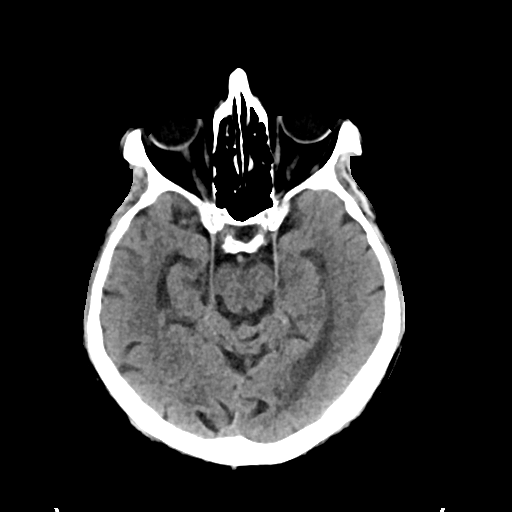
[im 18/35  brain]
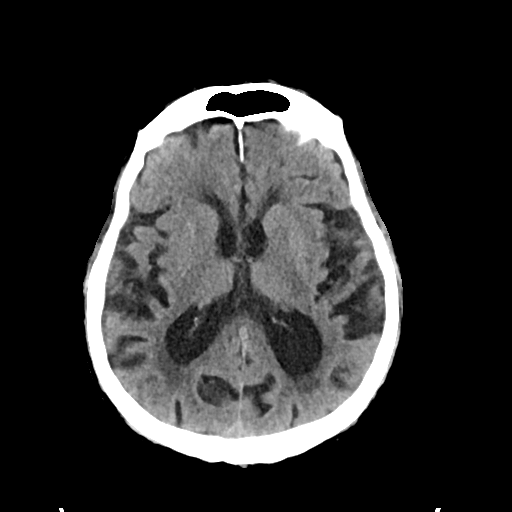
[im 22/35  brain]
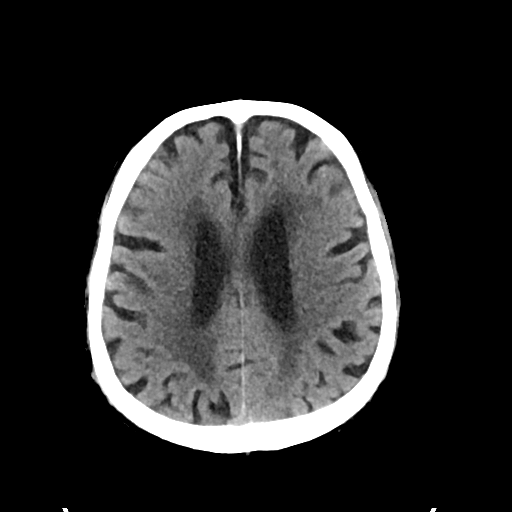
[im 22/35  bone]
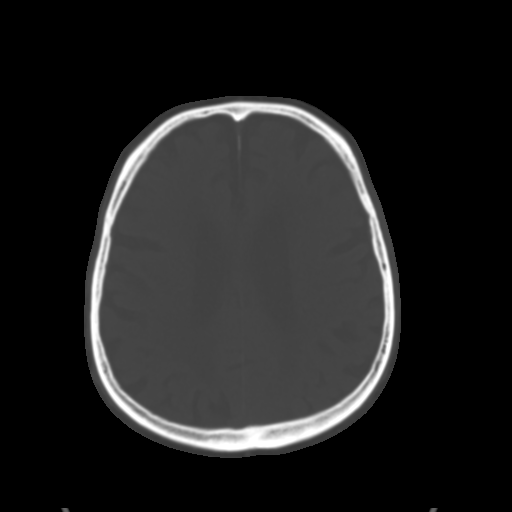
[im 26/35  brain]
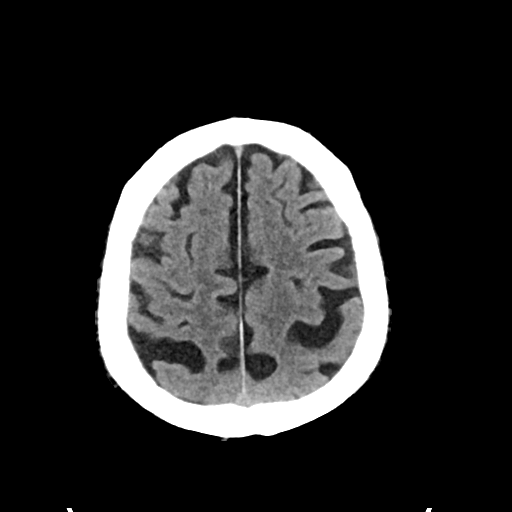
[im 30/35  brain]
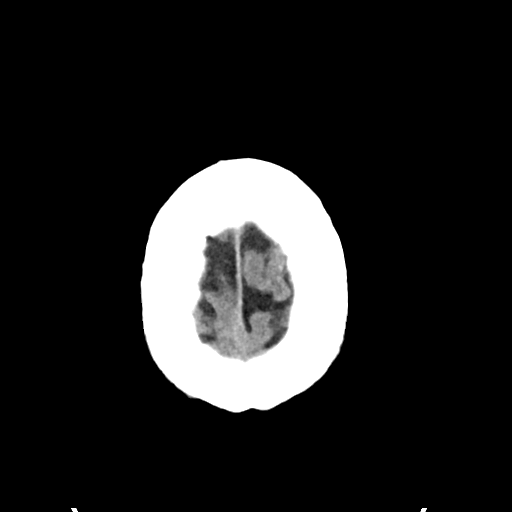

[Series 4: head bone · axial · 0.48mm/px · z∈[-170,-152]mm · 2 of 86 slices shown]
[im 9/86  bone]
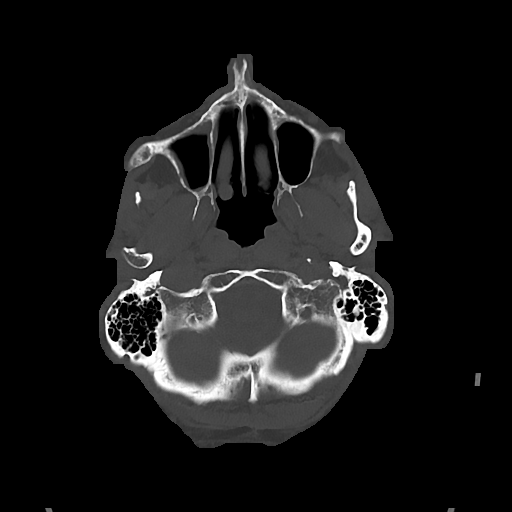
[im 18/86  bone]
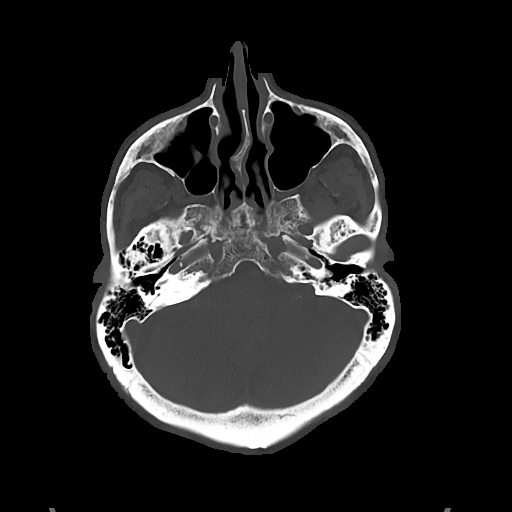

[Series 5: cor soft · coronal · 0.33mm/px · 3 of 70 slices shown]
[im 24/70  brain]
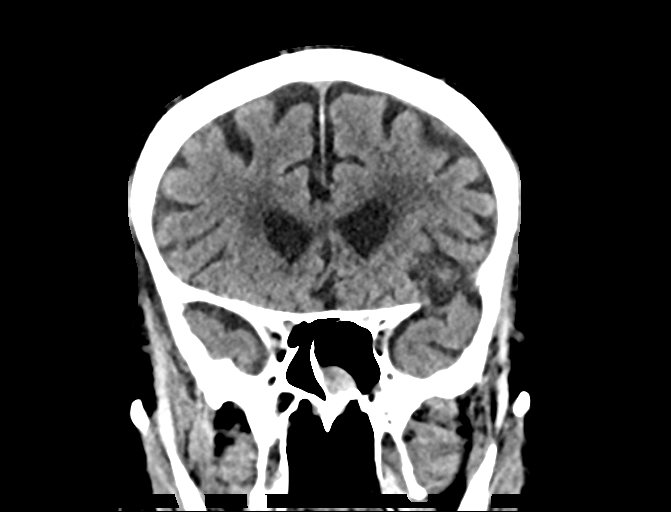
[im 31/70  brain]
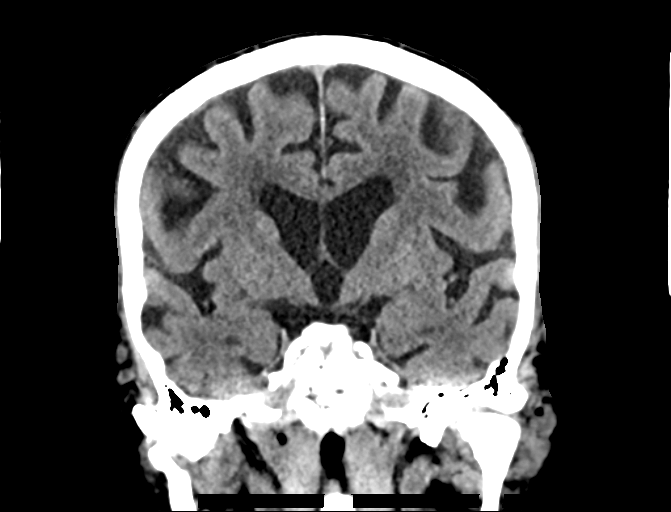
[im 39/70  brain]
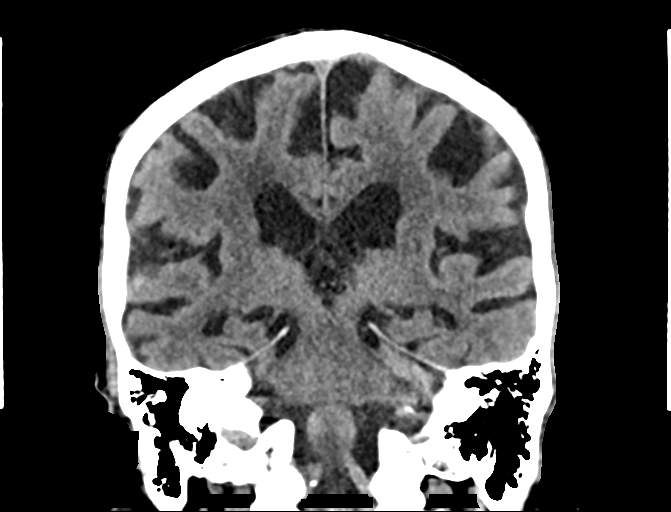

[Series 6: sag soft · sagittal · 0.33mm/px · 3 of 67 slices shown]
[im 23/67  brain]
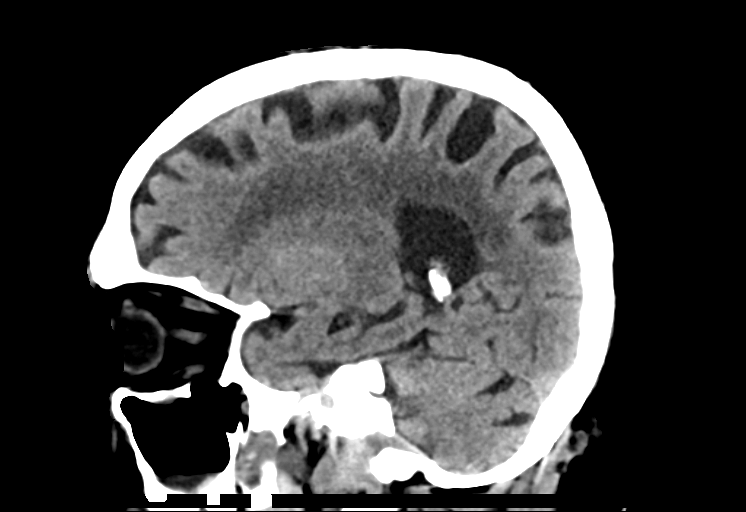
[im 34/67  brain]
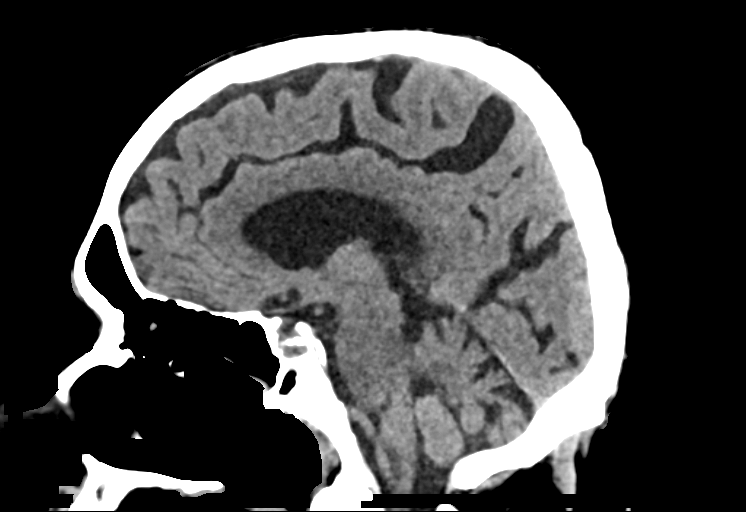
[im 45/67  brain]
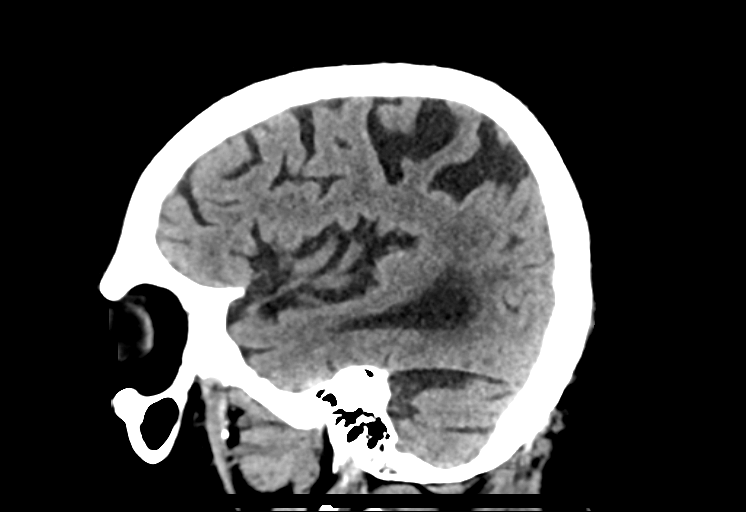

[15 of 47 positions shown; findings below may reference images not displayed]

FINDINGS: Brain: No evidence of acute infarction, hemorrhage, hydrocephalus,
extra-axial collection or mass lesion/mass effect. S phone lesion in
the right cerebellar cortex, likely due to prior ischemic
infarction. Advanced brain parenchymal volume loss and deep white
matter microangiopathy.

Vascular: Calcific atherosclerotic disease at the skull base.

Skull: Normal. Negative for fracture or focal lesion.

Sinuses/Orbits: Mild polypoid mucosal thickening of bilateral
ethmoid, frontal and right maxillary sinuses.

Other: None.
IMPRESSION: No acute intracranial abnormality.

Advanced brain parenchymal atrophy and chronic microvascular
disease.

Remote right cerebellar ischemic infarct.

Chronic sinusitis.

## 2018-06-17 DIAGNOSIS — Z7901 Long term (current) use of anticoagulants: Secondary | ICD-10-CM | POA: Diagnosis not present

## 2018-06-17 DIAGNOSIS — I480 Paroxysmal atrial fibrillation: Secondary | ICD-10-CM | POA: Diagnosis not present

## 2018-06-29 ENCOUNTER — Ambulatory Visit: Payer: Medicare HMO | Admitting: Podiatry

## 2018-06-29 ENCOUNTER — Encounter: Payer: Self-pay | Admitting: Podiatry

## 2018-06-29 DIAGNOSIS — Q828 Other specified congenital malformations of skin: Secondary | ICD-10-CM | POA: Diagnosis not present

## 2018-06-29 DIAGNOSIS — B351 Tinea unguium: Secondary | ICD-10-CM | POA: Diagnosis not present

## 2018-06-29 DIAGNOSIS — I739 Peripheral vascular disease, unspecified: Secondary | ICD-10-CM

## 2018-06-29 DIAGNOSIS — D689 Coagulation defect, unspecified: Secondary | ICD-10-CM

## 2018-06-29 DIAGNOSIS — M79676 Pain in unspecified toe(s): Secondary | ICD-10-CM

## 2018-06-29 NOTE — Progress Notes (Signed)
Complaint:  Visit Type: Patient returns to my office for continued preventative foot care services. Complaint: Patient states" my nails have grown long and thick and become painful to walk and wear shoes" Patient has been diagnosed with spinal stenosis. The patient presents for preventative foot care services. No changes to ROS.  Patient has painful callus left forefoot..  Patient is taking plavix. Podiatric Exam: Vascular: dorsalis pedis and posterior tibial pulses are palpable bilateral. Capillary return is immediate. Temperature gradient is WNL. Skin turgor WNL  Sensorium: Normal Semmes Weinstein monofilament test. Normal tactile sensation bilaterally. Nail Exam: Pt has thick disfigured discolored nails with subungual debris noted bilateral entire nail hallux through fifth toenails Ulcer Exam: There is no evidence of ulcer or pre-ulcerative changes or infection. Orthopedic Exam: Muscle tone and strength are WNL. No limitations in general ROM. No crepitus or effusions noted. Foot type and digits show no abnormalities. Bony prominences are unremarkable. Skin: No Porokeratosis. No infection or ulcers.  Callus left forefoot.  Diagnosis:  Onychomycosis, , Pain in right toe, pain in left toes,  Porokeratosis left forefoot.  Treatment & Plan Procedures and Treatment: Consent by patient was obtained for treatment procedures.   Debridement of mycotic and hypertrophic toenails, 1 through 5 bilateral and clearing of subungual debris. No ulceration, no infection noted. Debride porokeratosis left .  Iatrogenic lesion left foot. Return Visit-Office Procedure: Patient instructed to return to the office for a follow up visit 10 weeks  for continued evaluation and treatment.    Gardiner Barefoot DPM

## 2018-07-11 DIAGNOSIS — Z7901 Long term (current) use of anticoagulants: Secondary | ICD-10-CM | POA: Diagnosis not present

## 2018-07-11 DIAGNOSIS — I480 Paroxysmal atrial fibrillation: Secondary | ICD-10-CM | POA: Diagnosis not present

## 2018-08-08 DIAGNOSIS — I480 Paroxysmal atrial fibrillation: Secondary | ICD-10-CM | POA: Diagnosis not present

## 2018-08-08 DIAGNOSIS — Z7901 Long term (current) use of anticoagulants: Secondary | ICD-10-CM | POA: Diagnosis not present

## 2018-09-07 ENCOUNTER — Encounter: Payer: Self-pay | Admitting: Podiatry

## 2018-09-07 ENCOUNTER — Ambulatory Visit: Payer: Medicare HMO | Admitting: Podiatry

## 2018-09-07 ENCOUNTER — Other Ambulatory Visit: Payer: Self-pay

## 2018-09-07 VITALS — Temp 97.3°F

## 2018-09-07 DIAGNOSIS — B351 Tinea unguium: Secondary | ICD-10-CM

## 2018-09-07 DIAGNOSIS — M79676 Pain in unspecified toe(s): Secondary | ICD-10-CM

## 2018-09-07 DIAGNOSIS — I739 Peripheral vascular disease, unspecified: Secondary | ICD-10-CM | POA: Diagnosis not present

## 2018-09-07 DIAGNOSIS — D689 Coagulation defect, unspecified: Secondary | ICD-10-CM | POA: Diagnosis not present

## 2018-09-07 DIAGNOSIS — Q828 Other specified congenital malformations of skin: Secondary | ICD-10-CM | POA: Diagnosis not present

## 2018-09-07 DIAGNOSIS — Z7901 Long term (current) use of anticoagulants: Secondary | ICD-10-CM | POA: Diagnosis not present

## 2018-09-07 DIAGNOSIS — I480 Paroxysmal atrial fibrillation: Secondary | ICD-10-CM | POA: Diagnosis not present

## 2018-09-07 NOTE — Progress Notes (Signed)
Complaint:  Visit Type: Patient returns to my office for continued preventative foot care services. Complaint: Patient states" my nails have grown long and thick and become painful to walk and wear shoes" Patient has been diagnosed with spinal stenosis. The patient presents for preventative foot care services. No changes to ROS.  Patient has painful callus left forefoot..  Patient is taking plavix. Podiatric Exam: Vascular: dorsalis pedis and posterior tibial pulses are palpable bilateral. Capillary return is immediate. Temperature gradient is WNL. Skin turgor WNL  Sensorium: Normal Semmes Weinstein monofilament test. Normal tactile sensation bilaterally. Nail Exam: Pt has thick disfigured discolored nails with subungual debris noted bilateral entire nail hallux through fifth toenails Ulcer Exam: There is no evidence of ulcer or pre-ulcerative changes or infection. Orthopedic Exam: Muscle tone and strength are WNL. No limitations in general ROM. No crepitus or effusions noted. Foot type and digits show no abnormalities. Bony prominences are unremarkable. Skin: No Porokeratosis. No infection or ulcers.  Callus left forefoot.  Diagnosis:  Onychomycosis, , Pain in right toe, pain in left toes,  Porokeratosis left forefoot.  Treatment & Plan Procedures and Treatment: Consent by patient was obtained for treatment procedures.   Debridement of mycotic and hypertrophic toenails, 1 through 5 bilateral and clearing of subungual debris. No ulceration, no infection noted. Debride porokeratosis left .  Dispense biofreeze. Return Visit-Office Procedure: Patient instructed to return to the office for a follow up visit 10 weeks  for continued evaluation and treatment.    Gardiner Barefoot DPM

## 2018-09-09 DIAGNOSIS — I480 Paroxysmal atrial fibrillation: Secondary | ICD-10-CM | POA: Diagnosis not present

## 2018-09-09 DIAGNOSIS — G459 Transient cerebral ischemic attack, unspecified: Secondary | ICD-10-CM | POA: Diagnosis not present

## 2018-09-09 DIAGNOSIS — D649 Anemia, unspecified: Secondary | ICD-10-CM | POA: Diagnosis not present

## 2018-09-09 DIAGNOSIS — I509 Heart failure, unspecified: Secondary | ICD-10-CM | POA: Diagnosis not present

## 2018-09-09 DIAGNOSIS — I10 Essential (primary) hypertension: Secondary | ICD-10-CM | POA: Diagnosis not present

## 2018-09-09 DIAGNOSIS — K5900 Constipation, unspecified: Secondary | ICD-10-CM | POA: Diagnosis not present

## 2018-09-09 DIAGNOSIS — D692 Other nonthrombocytopenic purpura: Secondary | ICD-10-CM | POA: Diagnosis not present

## 2018-09-09 DIAGNOSIS — I2510 Atherosclerotic heart disease of native coronary artery without angina pectoris: Secondary | ICD-10-CM | POA: Diagnosis not present

## 2018-09-09 DIAGNOSIS — I714 Abdominal aortic aneurysm, without rupture: Secondary | ICD-10-CM | POA: Diagnosis not present

## 2018-10-12 DIAGNOSIS — I480 Paroxysmal atrial fibrillation: Secondary | ICD-10-CM | POA: Diagnosis not present

## 2018-10-12 DIAGNOSIS — Z7901 Long term (current) use of anticoagulants: Secondary | ICD-10-CM | POA: Diagnosis not present

## 2018-11-16 ENCOUNTER — Ambulatory Visit (INDEPENDENT_AMBULATORY_CARE_PROVIDER_SITE_OTHER): Payer: Medicare HMO | Admitting: Podiatry

## 2018-11-16 ENCOUNTER — Encounter: Payer: Self-pay | Admitting: Podiatry

## 2018-11-16 ENCOUNTER — Other Ambulatory Visit: Payer: Self-pay

## 2018-11-16 DIAGNOSIS — Q828 Other specified congenital malformations of skin: Secondary | ICD-10-CM | POA: Insufficient documentation

## 2018-11-16 DIAGNOSIS — M79675 Pain in left toe(s): Secondary | ICD-10-CM | POA: Diagnosis not present

## 2018-11-16 DIAGNOSIS — Z7901 Long term (current) use of anticoagulants: Secondary | ICD-10-CM | POA: Diagnosis not present

## 2018-11-16 DIAGNOSIS — D689 Coagulation defect, unspecified: Secondary | ICD-10-CM | POA: Diagnosis not present

## 2018-11-16 DIAGNOSIS — B351 Tinea unguium: Secondary | ICD-10-CM | POA: Insufficient documentation

## 2018-11-16 DIAGNOSIS — M79674 Pain in right toe(s): Secondary | ICD-10-CM

## 2018-11-16 DIAGNOSIS — I480 Paroxysmal atrial fibrillation: Secondary | ICD-10-CM | POA: Diagnosis not present

## 2018-11-30 DIAGNOSIS — G459 Transient cerebral ischemic attack, unspecified: Secondary | ICD-10-CM | POA: Diagnosis not present

## 2018-11-30 DIAGNOSIS — D649 Anemia, unspecified: Secondary | ICD-10-CM | POA: Diagnosis not present

## 2018-11-30 DIAGNOSIS — L84 Corns and callosities: Secondary | ICD-10-CM | POA: Diagnosis not present

## 2018-11-30 DIAGNOSIS — I714 Abdominal aortic aneurysm, without rupture: Secondary | ICD-10-CM | POA: Diagnosis not present

## 2018-11-30 DIAGNOSIS — I2510 Atherosclerotic heart disease of native coronary artery without angina pectoris: Secondary | ICD-10-CM | POA: Diagnosis not present

## 2018-11-30 DIAGNOSIS — I110 Hypertensive heart disease with heart failure: Secondary | ICD-10-CM | POA: Diagnosis not present

## 2018-11-30 DIAGNOSIS — I480 Paroxysmal atrial fibrillation: Secondary | ICD-10-CM | POA: Diagnosis not present

## 2018-11-30 DIAGNOSIS — I509 Heart failure, unspecified: Secondary | ICD-10-CM | POA: Diagnosis not present

## 2018-11-30 DIAGNOSIS — K5900 Constipation, unspecified: Secondary | ICD-10-CM | POA: Diagnosis not present

## 2018-11-30 DIAGNOSIS — D692 Other nonthrombocytopenic purpura: Secondary | ICD-10-CM | POA: Diagnosis not present

## 2018-11-30 DIAGNOSIS — H9190 Unspecified hearing loss, unspecified ear: Secondary | ICD-10-CM | POA: Diagnosis not present

## 2018-12-13 DIAGNOSIS — R7301 Impaired fasting glucose: Secondary | ICD-10-CM | POA: Diagnosis not present

## 2018-12-13 DIAGNOSIS — Z7901 Long term (current) use of anticoagulants: Secondary | ICD-10-CM | POA: Diagnosis not present

## 2018-12-13 DIAGNOSIS — Z79899 Other long term (current) drug therapy: Secondary | ICD-10-CM | POA: Diagnosis not present

## 2018-12-13 DIAGNOSIS — I10 Essential (primary) hypertension: Secondary | ICD-10-CM | POA: Diagnosis not present

## 2019-02-01 ENCOUNTER — Encounter: Payer: Self-pay | Admitting: Podiatry

## 2019-02-01 ENCOUNTER — Ambulatory Visit (INDEPENDENT_AMBULATORY_CARE_PROVIDER_SITE_OTHER): Payer: Medicare HMO | Admitting: Podiatry

## 2019-02-01 ENCOUNTER — Other Ambulatory Visit: Payer: Self-pay

## 2019-02-01 DIAGNOSIS — Q828 Other specified congenital malformations of skin: Secondary | ICD-10-CM | POA: Diagnosis not present

## 2019-02-01 DIAGNOSIS — M79674 Pain in right toe(s): Secondary | ICD-10-CM

## 2019-02-01 DIAGNOSIS — B351 Tinea unguium: Secondary | ICD-10-CM

## 2019-02-01 DIAGNOSIS — I739 Peripheral vascular disease, unspecified: Secondary | ICD-10-CM | POA: Diagnosis not present

## 2019-02-01 DIAGNOSIS — M79675 Pain in left toe(s): Secondary | ICD-10-CM | POA: Diagnosis not present

## 2019-02-01 DIAGNOSIS — D689 Coagulation defect, unspecified: Secondary | ICD-10-CM | POA: Diagnosis not present

## 2019-02-01 NOTE — Progress Notes (Signed)
Complaint:  Visit Type: Patient returns to my office for continued preventative foot care services. Complaint: Patient states" my nails have grown long and thick and become painful to walk and wear shoes" Patient has been diagnosed with spinal stenosis. The patient presents for preventative foot care services. No changes to ROS.  Patient has painful callus left forefoot..  Patient is taking plavix. Podiatric Exam: Vascular: dorsalis pedis and posterior tibial pulses are palpable bilateral. Capillary return is immediate. Temperature gradient is WNL. Skin turgor WNL  Sensorium: Normal Semmes Weinstein monofilament test. Normal tactile sensation bilaterally. Nail Exam: Pt has thick disfigured discolored nails with subungual debris noted bilateral entire nail hallux through fifth toenails Ulcer Exam: There is no evidence of ulcer or pre-ulcerative changes or infection. Orthopedic Exam: Muscle tone and strength are WNL. No limitations in general ROM. No crepitus or effusions noted. Foot type and digits show no abnormalities. Bony prominences are unremarkable. Skin: No Porokeratosis. No infection or ulcers.  Callus left forefoot.  Diagnosis:  Onychomycosis, , Pain in right toe, pain in left toes,  Porokeratosis left forefoot.  Treatment & Plan Procedures and Treatment: Consent by patient was obtained for treatment procedures.   Debridement of mycotic and hypertrophic toenails, 1 through 5 bilateral and clearing of subungual debris. No ulceration, no infection noted. Debride porokeratosis left .   Return Visit-Office Procedure: Patient instructed to return to the office for a follow up visit 10 weeks  for continued evaluation and treatment.    Gardiner Barefoot DPM

## 2019-02-21 DIAGNOSIS — Z7901 Long term (current) use of anticoagulants: Secondary | ICD-10-CM | POA: Diagnosis not present

## 2019-03-23 DIAGNOSIS — I480 Paroxysmal atrial fibrillation: Secondary | ICD-10-CM | POA: Diagnosis not present

## 2019-03-23 DIAGNOSIS — Z7901 Long term (current) use of anticoagulants: Secondary | ICD-10-CM | POA: Diagnosis not present

## 2019-04-26 DIAGNOSIS — I480 Paroxysmal atrial fibrillation: Secondary | ICD-10-CM | POA: Diagnosis not present

## 2019-04-26 DIAGNOSIS — Z7901 Long term (current) use of anticoagulants: Secondary | ICD-10-CM | POA: Diagnosis not present

## 2019-05-03 ENCOUNTER — Ambulatory Visit: Payer: Medicare HMO | Admitting: Podiatry

## 2019-06-13 DIAGNOSIS — I480 Paroxysmal atrial fibrillation: Secondary | ICD-10-CM | POA: Diagnosis not present

## 2019-06-13 DIAGNOSIS — Z7901 Long term (current) use of anticoagulants: Secondary | ICD-10-CM | POA: Diagnosis not present

## 2019-06-28 DIAGNOSIS — Z7901 Long term (current) use of anticoagulants: Secondary | ICD-10-CM | POA: Diagnosis not present

## 2019-06-28 DIAGNOSIS — G459 Transient cerebral ischemic attack, unspecified: Secondary | ICD-10-CM | POA: Diagnosis not present

## 2019-06-28 DIAGNOSIS — I480 Paroxysmal atrial fibrillation: Secondary | ICD-10-CM | POA: Diagnosis not present

## 2019-07-04 ENCOUNTER — Ambulatory Visit: Payer: Medicare HMO | Admitting: Podiatry

## 2019-07-04 ENCOUNTER — Other Ambulatory Visit: Payer: Self-pay

## 2019-07-04 ENCOUNTER — Encounter: Payer: Self-pay | Admitting: Podiatry

## 2019-07-04 DIAGNOSIS — I739 Peripheral vascular disease, unspecified: Secondary | ICD-10-CM

## 2019-07-04 DIAGNOSIS — M79674 Pain in right toe(s): Secondary | ICD-10-CM | POA: Diagnosis not present

## 2019-07-04 DIAGNOSIS — D689 Coagulation defect, unspecified: Secondary | ICD-10-CM

## 2019-07-04 DIAGNOSIS — M79675 Pain in left toe(s): Secondary | ICD-10-CM | POA: Diagnosis not present

## 2019-07-04 DIAGNOSIS — B351 Tinea unguium: Secondary | ICD-10-CM

## 2019-07-04 NOTE — Progress Notes (Signed)
Complaint:  Visit Type: Patient returns to my office for continued preventative foot care services. Complaint: Patient states" my nails have grown long and thick and become painful to walk and wear shoes" Patient has been diagnosed with spinal stenosis. The patient presents for preventative foot care services. No changes to ROS.  Patient has painful callus left forefoot..  Patient is taking plavix. Podiatric Exam: Vascular: dorsalis pedis and posterior tibial pulses are palpable bilateral. Capillary return is immediate. Temperature gradient is WNL. Skin turgor WNL  Sensorium: Normal Semmes Weinstein monofilament test. Normal tactile sensation bilaterally. Nail Exam: Pt has thick disfigured discolored nails with subungual debris noted bilateral entire nail hallux through fifth toenails Ulcer Exam: There is no evidence of ulcer or pre-ulcerative changes or infection. Orthopedic Exam: Muscle tone and strength are WNL. No limitations in general ROM. No crepitus or effusions noted. Foot type and digits show no abnormalities. Bony prominences are unremarkable. Skin: No Porokeratosis. No infection or ulcers.  Callus left forefoot.  Diagnosis:  Onychomycosis, , Pain in right toe, pain in left toes,    Treatment & Plan Procedures and Treatment: Consent by patient was obtained for treatment procedures.   Debridement of mycotic and hypertrophic toenails, 1 through 5 bilateral and clearing of subungual debris. No ulceration, no infection noted. Debride porokeratosis left .   Return Visit-Office Procedure: Patient instructed to return to the office for a follow up visit 4 months   for continued evaluation and treatment.    Gardiner Barefoot DPM

## 2019-07-25 DIAGNOSIS — Z7901 Long term (current) use of anticoagulants: Secondary | ICD-10-CM | POA: Diagnosis not present

## 2019-07-25 DIAGNOSIS — I480 Paroxysmal atrial fibrillation: Secondary | ICD-10-CM | POA: Diagnosis not present

## 2019-08-03 DIAGNOSIS — H35033 Hypertensive retinopathy, bilateral: Secondary | ICD-10-CM | POA: Diagnosis not present

## 2019-08-03 DIAGNOSIS — Z961 Presence of intraocular lens: Secondary | ICD-10-CM | POA: Diagnosis not present

## 2019-08-03 DIAGNOSIS — H26491 Other secondary cataract, right eye: Secondary | ICD-10-CM | POA: Diagnosis not present

## 2019-08-03 DIAGNOSIS — H02132 Senile ectropion of right lower eyelid: Secondary | ICD-10-CM | POA: Diagnosis not present

## 2019-08-24 DIAGNOSIS — I480 Paroxysmal atrial fibrillation: Secondary | ICD-10-CM | POA: Diagnosis not present

## 2019-08-24 DIAGNOSIS — Z7901 Long term (current) use of anticoagulants: Secondary | ICD-10-CM | POA: Diagnosis not present

## 2019-08-24 DIAGNOSIS — D6869 Other thrombophilia: Secondary | ICD-10-CM | POA: Diagnosis not present

## 2019-09-04 DIAGNOSIS — I714 Abdominal aortic aneurysm, without rupture: Secondary | ICD-10-CM | POA: Diagnosis not present

## 2019-09-04 DIAGNOSIS — I480 Paroxysmal atrial fibrillation: Secondary | ICD-10-CM | POA: Diagnosis not present

## 2019-09-04 DIAGNOSIS — K5900 Constipation, unspecified: Secondary | ICD-10-CM | POA: Diagnosis not present

## 2019-09-04 DIAGNOSIS — I10 Essential (primary) hypertension: Secondary | ICD-10-CM | POA: Diagnosis not present

## 2019-09-04 DIAGNOSIS — D692 Other nonthrombocytopenic purpura: Secondary | ICD-10-CM | POA: Diagnosis not present

## 2019-09-04 DIAGNOSIS — D6489 Other specified anemias: Secondary | ICD-10-CM | POA: Diagnosis not present

## 2019-09-04 DIAGNOSIS — G459 Transient cerebral ischemic attack, unspecified: Secondary | ICD-10-CM | POA: Diagnosis not present

## 2019-09-04 DIAGNOSIS — D6869 Other thrombophilia: Secondary | ICD-10-CM | POA: Diagnosis not present

## 2019-09-04 DIAGNOSIS — I2510 Atherosclerotic heart disease of native coronary artery without angina pectoris: Secondary | ICD-10-CM | POA: Diagnosis not present

## 2019-09-19 ENCOUNTER — Emergency Department (HOSPITAL_COMMUNITY): Payer: Medicare HMO

## 2019-09-19 ENCOUNTER — Encounter (HOSPITAL_COMMUNITY): Payer: Self-pay | Admitting: *Deleted

## 2019-09-19 ENCOUNTER — Inpatient Hospital Stay (HOSPITAL_COMMUNITY)
Admission: EM | Admit: 2019-09-19 | Discharge: 2019-09-25 | DRG: 535 | Disposition: A | Discharge status: Skilled Nursing Facility | Payer: Medicare HMO | Attending: Internal Medicine | Admitting: Internal Medicine

## 2019-09-19 DIAGNOSIS — M12812 Other specific arthropathies, not elsewhere classified, left shoulder: Secondary | ICD-10-CM

## 2019-09-19 DIAGNOSIS — Z66 Do not resuscitate: Secondary | ICD-10-CM | POA: Diagnosis present

## 2019-09-19 DIAGNOSIS — R58 Hemorrhage, not elsewhere classified: Secondary | ICD-10-CM | POA: Diagnosis present

## 2019-09-19 DIAGNOSIS — N2889 Other specified disorders of kidney and ureter: Secondary | ICD-10-CM

## 2019-09-19 DIAGNOSIS — S32402A Unspecified fracture of left acetabulum, initial encounter for closed fracture: Secondary | ICD-10-CM | POA: Diagnosis present

## 2019-09-19 DIAGNOSIS — N179 Acute kidney failure, unspecified: Secondary | ICD-10-CM | POA: Diagnosis present

## 2019-09-19 DIAGNOSIS — S32599A Other specified fracture of unspecified pubis, initial encounter for closed fracture: Secondary | ICD-10-CM | POA: Diagnosis present

## 2019-09-19 DIAGNOSIS — S329XXA Fracture of unspecified parts of lumbosacral spine and pelvis, initial encounter for closed fracture: Principal | ICD-10-CM

## 2019-09-19 DIAGNOSIS — I482 Chronic atrial fibrillation, unspecified: Secondary | ICD-10-CM | POA: Diagnosis present

## 2019-09-19 DIAGNOSIS — W19XXXA Unspecified fall, initial encounter: Secondary | ICD-10-CM | POA: Diagnosis present

## 2019-09-19 DIAGNOSIS — J449 Chronic obstructive pulmonary disease, unspecified: Secondary | ICD-10-CM

## 2019-09-19 DIAGNOSIS — J9600 Acute respiratory failure, unspecified whether with hypoxia or hypercapnia: Secondary | ICD-10-CM | POA: Diagnosis present

## 2019-09-19 DIAGNOSIS — M75102 Unspecified rotator cuff tear or rupture of left shoulder, not specified as traumatic: Secondary | ICD-10-CM | POA: Diagnosis present

## 2019-09-19 DIAGNOSIS — S46012A Strain of muscle(s) and tendon(s) of the rotator cuff of left shoulder, initial encounter: Secondary | ICD-10-CM | POA: Diagnosis not present

## 2019-09-19 DIAGNOSIS — R339 Retention of urine, unspecified: Secondary | ICD-10-CM | POA: Diagnosis present

## 2019-09-19 DIAGNOSIS — S32592A Other specified fracture of left pubis, initial encounter for closed fracture: Secondary | ICD-10-CM | POA: Diagnosis present

## 2019-09-19 DIAGNOSIS — I480 Paroxysmal atrial fibrillation: Secondary | ICD-10-CM | POA: Diagnosis present

## 2019-09-19 DIAGNOSIS — S46022D Laceration of muscle(s) and tendon(s) of the rotator cuff of left shoulder, subsequent encounter: Secondary | ICD-10-CM | POA: Diagnosis not present

## 2019-09-19 DIAGNOSIS — M6740 Ganglion, unspecified site: Secondary | ICD-10-CM | POA: Diagnosis present

## 2019-09-19 DIAGNOSIS — S32592B Other specified fracture of left pubis, initial encounter for open fracture: Secondary | ICD-10-CM | POA: Diagnosis not present

## 2019-09-19 DIAGNOSIS — S79911A Unspecified injury of right hip, initial encounter: Secondary | ICD-10-CM | POA: Diagnosis not present

## 2019-09-19 DIAGNOSIS — Z20822 Contact with and (suspected) exposure to covid-19: Secondary | ICD-10-CM | POA: Diagnosis not present

## 2019-09-19 DIAGNOSIS — M19012 Primary osteoarthritis, left shoulder: Secondary | ICD-10-CM | POA: Diagnosis present

## 2019-09-19 DIAGNOSIS — M25512 Pain in left shoulder: Secondary | ICD-10-CM | POA: Diagnosis not present

## 2019-09-19 DIAGNOSIS — Z885 Allergy status to narcotic agent status: Secondary | ICD-10-CM

## 2019-09-19 DIAGNOSIS — D696 Thrombocytopenia, unspecified: Secondary | ICD-10-CM | POA: Diagnosis present

## 2019-09-19 DIAGNOSIS — W19XXXD Unspecified fall, subsequent encounter: Secondary | ICD-10-CM | POA: Diagnosis not present

## 2019-09-19 DIAGNOSIS — R1312 Dysphagia, oropharyngeal phase: Secondary | ICD-10-CM | POA: Diagnosis not present

## 2019-09-19 DIAGNOSIS — I110 Hypertensive heart disease with heart failure: Secondary | ICD-10-CM | POA: Diagnosis present

## 2019-09-19 DIAGNOSIS — J9601 Acute respiratory failure with hypoxia: Secondary | ICD-10-CM | POA: Diagnosis not present

## 2019-09-19 DIAGNOSIS — Z87891 Personal history of nicotine dependence: Secondary | ICD-10-CM | POA: Diagnosis not present

## 2019-09-19 DIAGNOSIS — D649 Anemia, unspecified: Secondary | ICD-10-CM | POA: Diagnosis present

## 2019-09-19 DIAGNOSIS — S32502D Unspecified fracture of left pubis, subsequent encounter for fracture with routine healing: Secondary | ICD-10-CM | POA: Diagnosis not present

## 2019-09-19 DIAGNOSIS — M549 Dorsalgia, unspecified: Secondary | ICD-10-CM | POA: Diagnosis present

## 2019-09-19 DIAGNOSIS — Z7401 Bed confinement status: Secondary | ICD-10-CM | POA: Diagnosis not present

## 2019-09-19 DIAGNOSIS — Z7901 Long term (current) use of anticoagulants: Secondary | ICD-10-CM

## 2019-09-19 DIAGNOSIS — Y92019 Unspecified place in single-family (private) house as the place of occurrence of the external cause: Secondary | ICD-10-CM

## 2019-09-19 DIAGNOSIS — R0902 Hypoxemia: Secondary | ICD-10-CM | POA: Diagnosis not present

## 2019-09-19 DIAGNOSIS — H9190 Unspecified hearing loss, unspecified ear: Secondary | ICD-10-CM | POA: Diagnosis present

## 2019-09-19 DIAGNOSIS — G8929 Other chronic pain: Secondary | ICD-10-CM | POA: Diagnosis present

## 2019-09-19 DIAGNOSIS — N400 Enlarged prostate without lower urinary tract symptoms: Secondary | ICD-10-CM | POA: Diagnosis present

## 2019-09-19 DIAGNOSIS — S32591S Other specified fracture of right pubis, sequela: Secondary | ICD-10-CM | POA: Diagnosis not present

## 2019-09-19 DIAGNOSIS — W010XXA Fall on same level from slipping, tripping and stumbling without subsequent striking against object, initial encounter: Secondary | ICD-10-CM | POA: Diagnosis present

## 2019-09-19 DIAGNOSIS — M2550 Pain in unspecified joint: Secondary | ICD-10-CM | POA: Diagnosis not present

## 2019-09-19 DIAGNOSIS — S0990XA Unspecified injury of head, initial encounter: Secondary | ICD-10-CM | POA: Diagnosis not present

## 2019-09-19 DIAGNOSIS — I5020 Unspecified systolic (congestive) heart failure: Secondary | ICD-10-CM | POA: Diagnosis not present

## 2019-09-19 DIAGNOSIS — I444 Left anterior fascicular block: Secondary | ICD-10-CM | POA: Diagnosis present

## 2019-09-19 DIAGNOSIS — S46112A Strain of muscle, fascia and tendon of long head of biceps, left arm, initial encounter: Secondary | ICD-10-CM | POA: Diagnosis present

## 2019-09-19 DIAGNOSIS — M5489 Other dorsalgia: Secondary | ICD-10-CM | POA: Diagnosis not present

## 2019-09-19 DIAGNOSIS — I5032 Chronic diastolic (congestive) heart failure: Secondary | ICD-10-CM | POA: Diagnosis present

## 2019-09-19 DIAGNOSIS — R52 Pain, unspecified: Secondary | ICD-10-CM | POA: Diagnosis not present

## 2019-09-19 DIAGNOSIS — I4891 Unspecified atrial fibrillation: Secondary | ICD-10-CM | POA: Diagnosis not present

## 2019-09-19 DIAGNOSIS — S3289XA Fracture of other parts of pelvis, initial encounter for closed fracture: Secondary | ICD-10-CM | POA: Diagnosis not present

## 2019-09-19 DIAGNOSIS — I10 Essential (primary) hypertension: Secondary | ICD-10-CM | POA: Diagnosis not present

## 2019-09-19 DIAGNOSIS — S32402D Unspecified fracture of left acetabulum, subsequent encounter for fracture with routine healing: Secondary | ICD-10-CM | POA: Diagnosis not present

## 2019-09-19 DIAGNOSIS — R069 Unspecified abnormalities of breathing: Secondary | ICD-10-CM | POA: Diagnosis not present

## 2019-09-19 DIAGNOSIS — M75122 Complete rotator cuff tear or rupture of left shoulder, not specified as traumatic: Secondary | ICD-10-CM | POA: Diagnosis not present

## 2019-09-19 DIAGNOSIS — S4992XA Unspecified injury of left shoulder and upper arm, initial encounter: Secondary | ICD-10-CM | POA: Diagnosis not present

## 2019-09-19 HISTORY — DX: Chronic obstructive pulmonary disease, unspecified: J44.9

## 2019-09-19 HISTORY — DX: Heart failure, unspecified: I50.9

## 2019-09-19 HISTORY — DX: Unspecified atrial fibrillation: I48.91

## 2019-09-19 LAB — POC SARS CORONAVIRUS 2 AG -  ED: SARS Coronavirus 2 Ag: NEGATIVE

## 2019-09-19 LAB — BASIC METABOLIC PANEL
Anion gap: 8 (ref 5–15)
BUN: 38 mg/dL — ABNORMAL HIGH (ref 8–23)
CO2: 27 mmol/L (ref 22–32)
Calcium: 8.9 mg/dL (ref 8.9–10.3)
Chloride: 107 mmol/L (ref 98–111)
Creatinine, Ser: 1.73 mg/dL — ABNORMAL HIGH (ref 0.61–1.24)
GFR calc Af Amer: 38 mL/min — ABNORMAL LOW (ref >60)
GFR calc non Af Amer: 33 mL/min — ABNORMAL LOW (ref >60)
Glucose, Bld: 140 mg/dL — ABNORMAL HIGH (ref 70–99)
Potassium: 4.5 mmol/L (ref 3.5–5.1)
Sodium: 142 mmol/L (ref 135–145)

## 2019-09-19 LAB — CBC WITH DIFFERENTIAL/PLATELET
Abs Immature Granulocytes: 0.06 10*3/uL (ref 0.00–0.07)
Basophils Absolute: 0 10*3/uL (ref 0.0–0.1)
Basophils Relative: 0 %
Eosinophils Absolute: 0.1 10*3/uL (ref 0.0–0.5)
Eosinophils Relative: 1 %
HCT: 32.1 % — ABNORMAL LOW (ref 39.0–52.0)
Hemoglobin: 9.6 g/dL — ABNORMAL LOW (ref 13.0–17.0)
Immature Granulocytes: 1 %
Lymphocytes Relative: 6 %
Lymphs Abs: 0.5 10*3/uL — ABNORMAL LOW (ref 0.7–4.0)
MCH: 28.7 pg (ref 26.0–34.0)
MCHC: 29.9 g/dL — ABNORMAL LOW (ref 30.0–36.0)
MCV: 95.8 fL (ref 80.0–100.0)
Monocytes Absolute: 0.5 10*3/uL (ref 0.1–1.0)
Monocytes Relative: 6 %
Neutro Abs: 7.1 10*3/uL (ref 1.7–7.7)
Neutrophils Relative %: 86 %
Platelets: 114 10*3/uL — ABNORMAL LOW (ref 150–400)
RBC: 3.35 MIL/uL — ABNORMAL LOW (ref 4.22–5.81)
RDW: 14.6 % (ref 11.5–15.5)
WBC: 8.2 10*3/uL (ref 4.0–10.5)
nRBC: 0 % (ref 0.0–0.2)

## 2019-09-19 LAB — URINALYSIS, ROUTINE W REFLEX MICROSCOPIC
Bacteria, UA: NONE SEEN
Bilirubin Urine: NEGATIVE
Glucose, UA: NEGATIVE mg/dL
Hgb urine dipstick: NEGATIVE
Ketones, ur: NEGATIVE mg/dL
Nitrite: NEGATIVE
Protein, ur: NEGATIVE mg/dL
Specific Gravity, Urine: 1.013 (ref 1.005–1.030)
pH: 7 (ref 5.0–8.0)

## 2019-09-19 LAB — RESPIRATORY PANEL BY RT PCR (FLU A&B, COVID)
Influenza A by PCR: NEGATIVE
Influenza B by PCR: NEGATIVE
SARS Coronavirus 2 by RT PCR: NEGATIVE

## 2019-09-19 LAB — CREATININE, URINE, RANDOM: Creatinine, Urine: 75.91 mg/dL

## 2019-09-19 LAB — PROTIME-INR
INR: 2.5 — ABNORMAL HIGH (ref 0.8–1.2)
Prothrombin Time: 26.7 seconds — ABNORMAL HIGH (ref 11.4–15.2)

## 2019-09-19 LAB — SODIUM, URINE, RANDOM: Sodium, Ur: 45 mmol/L

## 2019-09-19 LAB — BRAIN NATRIURETIC PEPTIDE: B Natriuretic Peptide: 153.8 pg/mL — ABNORMAL HIGH (ref 0.0–100.0)

## 2019-09-19 MED ORDER — FENTANYL CITRATE (PF) 100 MCG/2ML IJ SOLN
25.0000 ug | INTRAMUSCULAR | Status: DC | PRN
  Administered 2019-09-19 – 2019-09-20 (×8): 25 ug via INTRAVENOUS
  Filled 2019-09-19 (×8): qty 2

## 2019-09-19 MED ORDER — DICLOFENAC SODIUM 1 % EX GEL
2.0000 g | Freq: Four times a day (QID) | CUTANEOUS | Status: DC
  Administered 2019-09-19 – 2019-09-25 (×17): 2 g via TOPICAL
  Filled 2019-09-19 (×2): qty 100

## 2019-09-19 MED ORDER — SODIUM CHLORIDE 0.9% FLUSH
3.0000 mL | Freq: Two times a day (BID) | INTRAVENOUS | Status: DC
  Administered 2019-09-19 – 2019-09-25 (×11): 3 mL via INTRAVENOUS

## 2019-09-19 MED ORDER — ACETAMINOPHEN 325 MG PO TABS
650.0000 mg | ORAL_TABLET | Freq: Four times a day (QID) | ORAL | Status: DC | PRN
  Administered 2019-09-22 (×2): 650 mg via ORAL
  Filled 2019-09-19 (×2): qty 2

## 2019-09-19 MED ORDER — ACETAMINOPHEN 650 MG RE SUPP: 650.0000 mg | Freq: Four times a day (QID) | RECTAL | Status: DC | PRN

## 2019-09-19 MED ORDER — ALBUTEROL SULFATE (2.5 MG/3ML) 0.083% IN NEBU: 2.5000 mg | INHALATION_SOLUTION | Freq: Four times a day (QID) | RESPIRATORY_TRACT | Status: DC | PRN

## 2019-09-19 MED ORDER — ALBUTEROL SULFATE HFA 108 (90 BASE) MCG/ACT IN AERS
2.0000 | INHALATION_SPRAY | Freq: Once | RESPIRATORY_TRACT | Status: AC
  Administered 2019-09-19: 2 via RESPIRATORY_TRACT
  Filled 2019-09-19: qty 6.7

## 2019-09-19 MED ORDER — ALBUTEROL SULFATE (2.5 MG/3ML) 0.083% IN NEBU
2.5000 mg | INHALATION_SOLUTION | Freq: Four times a day (QID) | RESPIRATORY_TRACT | Status: DC
  Administered 2019-09-19 – 2019-09-20 (×3): 2.5 mg via RESPIRATORY_TRACT
  Filled 2019-09-19 (×2): qty 3

## 2019-09-19 MED ORDER — METHOCARBAMOL 1000 MG/10ML IJ SOLN
500.0000 mg | Freq: Once | INTRAVENOUS | Status: AC
  Administered 2019-09-19: 500 mg via INTRAVENOUS
  Filled 2019-09-19: qty 5

## 2019-09-19 MED ORDER — FENTANYL CITRATE (PF) 100 MCG/2ML IJ SOLN
50.0000 ug | Freq: Once | INTRAMUSCULAR | Status: AC
  Administered 2019-09-19: 50 ug via INTRAVENOUS
  Filled 2019-09-19: qty 2

## 2019-09-19 MED ORDER — METHOCARBAMOL 500 MG PO TABS
500.0000 mg | ORAL_TABLET | Freq: Three times a day (TID) | ORAL | Status: DC | PRN
  Administered 2019-09-20 – 2019-09-25 (×10): 500 mg via ORAL
  Filled 2019-09-19 (×13): qty 1

## 2019-09-19 NOTE — ED Notes (Signed)
Lunch Tray Ordered @1144. 

## 2019-09-19 NOTE — ED Provider Notes (Signed)
[facility] EMERGENCY DEPARTMENT Provider Note   CSN: [id number] Arrival date & time: 09/19/19  0551     History Chief Complaint  Patient presents with  . Fall  . Hip Pain    Chad Buchanan is a 86 y.o. male.  HPI     This is a 86 year old male with history of atrial fibrillation and COPD who presents following a fall.  Patient reports that he fell this morning around 1 AM.  He reports slipping and falling.  He required help to get up off the ground by his grandson.  He was able to make it to his bedroom.  He reports he has had increasing right sided leg and hip pain.  Pain is worse with range of motion.  When sitting still he has no pain.  He denies any numbness or tingling.  Per EMS report he was noted to have O2 sats in the 70s.  He has a history of COPD but is not on home oxygen.  He is on Coumadin for atrial fibrillation.  Patient denies hitting his head or loss of consciousness.  Denies any recent fevers or cough.  Past Medical History:  Diagnosis Date  . A-fib ([place])   . AAA (abdominal aortic aneurysm) ([place])   . CHF (congestive heart failure) ([place])   . COPD (chronic obstructive pulmonary disease) ([facility])     There are no problems to display for this patient.   Past Surgical History:  Procedure Laterality Date  . ABDOMINAL AORTIC ANEURYSM REPAIR    . EYE SURGERY         No family history on file.  Social History   Tobacco Use  . Smoking status: Former Smoker  Substance Use Topics  . Alcohol use: Not on file  . Drug use: Not on file    Home Medications Prior to Admission medications   Not on File    Allergies    Oxycodone and Tramadol  Review of Systems   Review of Systems  Constitutional: Negative for fever.  Respiratory: Negative for shortness of breath.   Gastrointestinal: Negative for nausea and vomiting.  Musculoskeletal:       Right hip pain  Neurological: Negative for headaches.  All other systems reviewed and are  negative.   Physical Exam Updated Vital Signs BP 132/76   Pulse 85   Temp 97.6 F (36.4 C) (Temporal)   Resp 18   Ht 1.702 m (5\' 7" )   Wt 75.8 kg   SpO2 94%   BMI 26.16 kg/m   Physical Exam Vitals and nursing note reviewed.  Constitutional:      Appearance: He is well-developed.     Comments: Elderly, chronically ill-appearing  HENT:     Head: Normocephalic and atraumatic.     Nose: Nose normal.     Comments: Poor dentition    Mouth/Throat:     Mouth: Mucous membranes are moist.  Eyes:     Pupils: Pupils are equal, round, and reactive to light.  Cardiovascular:     Rate and Rhythm: Normal rate and regular rhythm.     Heart sounds: Normal heart sounds. No murmur.  Pulmonary:     Effort: Pulmonary effort is normal. No respiratory distress.     Breath sounds: Normal breath sounds. No wheezing.     Comments: Occasional wheeze noted, coarse rales in right base, tenderness palpation right chest wall without overlying skin change or crepitus Abdominal:     General: Bowel sounds are  normal.     Palpations: Abdomen is soft.     Tenderness: There is no abdominal tenderness. There is no rebound.  Musculoskeletal:     Cervical back: Neck supple.     Right lower leg: Edema present.     Left lower leg: Edema present.     Comments: Tenderness palpation and range of motion of the right hip, no obvious deformity, 2+ DP pulse  Lymphadenopathy:     Cervical: No cervical adenopathy.  Skin:    General: Skin is warm and dry.  Neurological:     Mental Status: He is alert and oriented to person, place, and time.  Psychiatric:        Mood and Affect: Mood normal.     ED Results / Procedures / Treatments   Labs (all labs ordered are listed, but only abnormal results are displayed) Labs Reviewed  CBC WITH DIFFERENTIAL/PLATELET - Abnormal; Notable for the following components:      Result Value   RBC 3.35 (*)    Hemoglobin 9.6 (*)    HCT 32.1 (*)    MCHC 29.9 (*)    Platelets  114 (*)    Lymphs Abs 0.5 (*)    All other components within normal limits  PROTIME-INR - Abnormal; Notable for the following components:   Prothrombin Time 26.7 (*)    INR 2.5 (*)    All other components within normal limits  BASIC METABOLIC PANEL  BRAIN NATRIURETIC PEPTIDE    EKG ED ECG REPORT   Date: 09/19/2019  Rate: 82  Rhythm: atrial fibrillation  QRS Axis: normal  Intervals: normal  ST/T Wave abnormalities: normal  Conduction Disutrbances:left anterior fascicular block  Narrative Interpretation:   Old EKG Reviewed: none available  I have personally reviewed the EKG tracing and agree with the computerized printout as noted.   Radiology DG Chest 2 View  Result Date: 09/19/2019 CLINICAL DATA:  Hypoxia EXAM: CHEST - 2 VIEW COMPARISON:  Sep 26, 2017 FINDINGS: There is marked cardiomegaly as on the prior exam. Aortic knob calcifications. Diffusely increased interstitial markings with reticulonodular opacities throughout both lungs which are grossly unchanged from the prior exam. There is elevation of the right hemidiaphragm. No large airspace consolidation or pleural effusion. No acute osseous abnormality. IMPRESSION: No significant change in the diffuse chronic interstitial lung changes. Moderate cardiomegaly. Electronically Signed   By: Prudencio Pair M.D.   On: 09/19/2019 06:36   CT Head Wo Contrast  Result Date: 09/19/2019 CLINICAL DATA:  Head trauma EXAM: CT HEAD WITHOUT CONTRAST TECHNIQUE: Contiguous axial images were obtained from the base of the skull through the vertex without intravenous contrast. COMPARISON:  Sep 26, 2017 FINDINGS: Brain: No evidence of acute territorial infarction, hemorrhage, hydrocephalus,extra-axial collection or mass lesion/mass effect. There is dilatation the ventricles and sulci consistent with age-related atrophy. Low-attenuation changes in the deep white matter consistent with small vessel ischemia. Vascular: No hyperdense vessel or unexpected  calcification. Calcifications are seen within the internal carotid artery. Skull: The skull is intact. No fracture or focal lesion identified. Sinuses/Orbits: Sphenoid air cell mucosal thickening and retention cysts are seen. The orbits and globes intact. Other: None IMPRESSION: No acute intracranial abnormality. Findings consistent with age related atrophy and chronic small vessel ischemia Electronically Signed   By: Prudencio Pair M.D.   On: 09/19/2019 06:50   DG Hip Unilat  With Pelvis 2-3 Views Right  Result Date: 09/19/2019 CLINICAL DATA:  Fall. EXAM: DG HIP (WITH OR WITHOUT PELVIS) 2-3V RIGHT  COMPARISON:  None. FINDINGS: Generalized osteopenia. No dislocation or visible hip fracture. Aortic iliac stenting. IMPRESSION: No evidence of fracture. Electronically Signed   By: Monte Fantasia M.D.   On: 09/19/2019 06:32    Procedures Procedures (including critical care time)  Medications Ordered in ED Medications  albuterol (VENTOLIN HFA) 108 (90 Base) MCG/ACT inhaler 2 puff (has no administration in time range)    ED Course  I have reviewed the triage vital signs and the nursing notes.  Pertinent labs & imaging results that were available during my care of the patient were reviewed by me and considered in my medical decision making (see chart for details).    MDM Rules/Calculators/A&P                       Patient presents with right hip pain following a fall.  He was noted to be hypoxic by EMS as well.  He does have history of COPD but is not on oxygen.  He is overall nontoxic-appearing.  He is 2 L of nasal cannula.  He has pain with palpation of the right hip but no obvious deformity or foreshortening.  Labs obtained.  He is wheezing with coarse breath sounds in the right.  Chest x-ray shows some diffuse interstitial change but no pneumonia or pneumothorax.  No rib fractures.  Initial x-ray of the pelvis without evidence of obvious fracture.  Given ongoing pain, will obtain a CT.  CT had  also obtained as patient is on Coumadin and had a fall.  CT head is negative.  CT pelvis is pending.  Disposition pending work-up.  Final Clinical Impression(s) / ED Diagnoses Final diagnoses:  Closed nondisplaced fracture of pelvis, unspecified part of pelvis, initial encounter [facility])  Renal mass  Chronic obstructive pulmonary disease, unspecified COPD type [facility])    Rx / DC Orders ED Discharge Orders    None       Merryl Hacker, MD 09/19/19 2300

## 2019-09-19 NOTE — ED Triage Notes (Signed)
Pt from home after fall by EMS, c/o R hip and femur pain. EMS reported room air sats in the 70s. Hx of COPD and asthma. Currently on 4L oxygen [place] On coumadin for afib

## 2019-09-19 NOTE — Consult Note (Signed)
Reason for Consult:Left pelvic fxs Referring Physician: Casey Burkitt. is an 86 y.o. male.  HPI: Chad Buchanan slipped and fell early this morning. His grandson helped him get up and back to bed but he was unable to get up or walk and so was brought to the ED for evaluation. He was made a level 2 trauma 2/2 fall on anticoagulation. He c/o pain in his pelvic area but is vague on specific location. He lives at home and ambulates well.  Past Medical History:  Diagnosis Date  . A-fib ([place])   . AAA (abdominal aortic aneurysm) ([place])   . CHF (congestive heart failure) ([place])   . COPD (chronic obstructive pulmonary disease) ([place])     Past Surgical History:  Procedure Laterality Date  . ABDOMINAL AORTIC ANEURYSM REPAIR    . EYE SURGERY      No family history on file.  Social History:  reports that he has quit smoking. He does not have any smokeless tobacco history on file. No history on file for alcohol and drug.  Allergies:  Allergies  Allergen Reactions  . Oxycodone   . Tramadol     Medications: I have reviewed the patient's current medications.  Results for orders placed or performed during the hospital encounter of 09/19/19 (from the past 48 hour(s))  CBC with Differential     Status: Abnormal   Collection Time: 09/19/19  6:06 AM  Result Value Ref Range   WBC 8.2 4.0 - 10.5 K/uL   RBC 3.35 (L) 4.22 - 5.81 MIL/uL   Hemoglobin 9.6 (L) 13.0 - 17.0 g/dL   HCT 32.1 (L) 39.0 - 52.0 %   MCV 95.8 80.0 - 100.0 fL   MCH 28.7 26.0 - 34.0 pg   MCHC 29.9 (L) 30.0 - 36.0 g/dL   RDW 14.6 11.5 - 15.5 %   Platelets 114 (L) 150 - 400 K/uL    Comment: Immature Platelet Fraction may be clinically indicated, consider ordering this additional test [id number] PLATELET COUNT CONFIRMED BY SMEAR REPEATED TO VERIFY    nRBC 0.0 0.0 - 0.2 %   Neutrophils Relative % 86 %   Neutro Abs 7.1 1.7 - 7.7 K/uL   Lymphocytes Relative 6 %   Lymphs Abs 0.5 (L) 0.7 - 4.0 K/uL   Monocytes Relative 6 %    Monocytes Absolute 0.5 0.1 - 1.0 K/uL   Eosinophils Relative 1 %   Eosinophils Absolute 0.1 0.0 - 0.5 K/uL   Basophils Relative 0 %   Basophils Absolute 0.0 0.0 - 0.1 K/uL   Immature Granulocytes 1 %   Abs Immature Granulocytes 0.06 0.00 - 0.07 K/uL    Comment: Performed at [facility], [address], [place], [place] [address]  Basic metabolic panel     Status: Abnormal   Collection Time: 09/19/19  6:06 AM  Result Value Ref Range   Sodium 142 135 - 145 mmol/L   Potassium 4.5 3.5 - 5.1 mmol/L   Chloride 107 98 - 111 mmol/L   CO2 27 22 - 32 mmol/L   Glucose, Bld 140 (H) 70 - 99 mg/dL    Comment: Glucose reference range applies only to samples taken after fasting for at least 8 hours.   BUN 38 (H) 8 - 23 mg/dL   Creatinine, Ser 1.73 (H) 0.61 - 1.24 mg/dL   Calcium 8.9 8.9 - 10.3 mg/dL   GFR calc non Af Amer 33 (L) >60 mL/min   GFR calc Af  Amer 38 (L) >60 mL/min   Anion gap 8 5 - 15    Comment: Performed at [facility], [place] [address] [place], [place] [address]  Brain natriuretic peptide     Status: Abnormal   Collection Time: 09/19/19  6:06 AM  Result Value Ref Range   B Natriuretic Peptide 153.8 (H) 0.0 - 100.0 pg/mL    Comment: Performed at [facility] [address], [place], [place] [address]  Protime-INR     Status: Abnormal   Collection Time: 09/19/19  6:06 AM  Result Value Ref Range   Prothrombin Time 26.7 (H) 11.4 - 15.2 seconds   INR 2.5 (H) 0.8 - 1.2    Comment: (NOTE) INR goal varies based on device and disease states. Performed at [facility], [place] [address], [place], [place] [address]   POC SARS Coronavirus 2 Ag-ED - Nasal Swab (BD Veritor Kit)     Status: None   Collection Time: 09/19/19  8:20 AM  Result Value Ref Range   SARS Coronavirus 2 Ag NEGATIVE NEGATIVE    Comment: (NOTE) SARS-CoV-2 antigen NOT DETECTED.  Negative results are presumptive.  Negative results do not preclude SARS-CoV-2 infection and should not be used as  the sole basis for treatment or other patient management decisions, including infection  control decisions, particularly in the presence of clinical signs and  symptoms consistent with COVID-19, or in those who have been in contact with the virus.  Negative results must be combined with clinical observations, patient history, and epidemiological information. The expected result is Negative. Fact Sheet for Patients: PodPark.tn Fact Sheet for Healthcare Providers: GiftContent.is This test is not yet approved or cleared by the [place] FDA and  has been authorized for detection and/or diagnosis of SARS-CoV-2 by FDA under an Emergency Use Authorization (EUA).  This EUA will remain in effect (meaning this test can be used) for the duration of  the COVID-19 de claration under Section 564(b)(1) of the Act, 21 U.S.C. section 360bbb-3(b)(1), unless the authorization is terminated or revoked sooner.     DG Chest 2 View  Result Date: 09/19/2019 CLINICAL DATA:  Hypoxia EXAM: CHEST - 2 VIEW COMPARISON:  Sep 26, 2017 FINDINGS: There is marked cardiomegaly as on the prior exam. Aortic knob calcifications. Diffusely increased interstitial markings with reticulonodular opacities throughout both lungs which are grossly unchanged from the prior exam. There is elevation of the right hemidiaphragm. No large airspace consolidation or pleural effusion. No acute osseous abnormality. IMPRESSION: No significant change in the diffuse chronic interstitial lung changes. Moderate cardiomegaly. Electronically Signed   By: Prudencio Pair M.D.   On: 09/19/2019 06:36   CT Head Wo Contrast  Result Date: 09/19/2019 CLINICAL DATA:  Head trauma EXAM: CT HEAD WITHOUT CONTRAST TECHNIQUE: Contiguous axial images were obtained from the base of the skull through the vertex without intravenous contrast. COMPARISON:  Sep 26, 2017 FINDINGS: Brain: No evidence of acute  territorial infarction, hemorrhage, hydrocephalus,extra-axial collection or mass lesion/mass effect. There is dilatation the ventricles and sulci consistent with age-related atrophy. Low-attenuation changes in the deep white matter consistent with small vessel ischemia. Vascular: No hyperdense vessel or unexpected calcification. Calcifications are seen within the internal carotid artery. Skull: The skull is intact. No fracture or focal lesion identified. Sinuses/Orbits: Sphenoid air cell mucosal thickening and retention cysts are seen. The orbits and globes intact. Other: None IMPRESSION: No acute intracranial abnormality. Findings consistent with age related atrophy and chronic small vessel  ischemia Electronically Signed   By: Prudencio Pair M.D.   On: 09/19/2019 06:50   CT PELVIS WO CONTRAST  Result Date: 09/19/2019 CLINICAL DATA:  Fall at home with left hip pain. EXAM: CT PELVIS WITHOUT CONTRAST TECHNIQUE: Multidetector CT imaging of the pelvis was performed following the standard protocol without intravenous contrast. COMPARISON:  Radiography from earlier today FINDINGS: Urinary Tract: Full urinary bladder. ~6 cm high-density mass at the right flank where there was a cyst seen previously. The finding is incompletely covered. Bowel:  No acute finding Vascular/Lymphatic: Diffuse atherosclerotic calcification. Remote aorto bi-iliac stenting. Reproductive:  Enlarged bladder up lifting the bladder base. Other:  Fatty bilateral inguinal and umbilical hernias. Musculoskeletal: Branching fracture at the level of the left acetabulum extending from the puboacetabular junction posteriorly and superiorly along the ilium. On coronal reformats there is a distinct fracture through the roof the lateral superior left acetabulum. No displacement along the articular surface. Nondisplaced fracture through the left inferior pubic ramus best seen on reformats. Both proximal femora are located and intact. No visible sacral  insufficiency fracture. Advanced lumbar spine degeneration. IMPRESSION: 1. Nondisplaced fracture through the left obturator ring and acetabulum. 2. Mild extraperitoneal hemorrhage in the left pelvis. 3. 6 cm partially covered high-density mass at the right flank where there was a renal cyst seen in 2012. This presumably reflects intracystic hemorrhage, but is incompletely visualized and solid mass is not excluded. Enhanced CT or renal ultrasound could be attempted if appropriate for comorbidities. 4. Additional incidental findings noted above. Electronically Signed   By: Monte Fantasia M.D.   On: 09/19/2019 07:49   DG Hip Unilat  With Pelvis 2-3 Views Right  Result Date: 09/19/2019 CLINICAL DATA:  Fall. EXAM: DG HIP (WITH OR WITHOUT PELVIS) 2-3V RIGHT COMPARISON:  None. FINDINGS: Generalized osteopenia. No dislocation or visible hip fracture. Aortic iliac stenting. IMPRESSION: No evidence of fracture. Electronically Signed   By: Monte Fantasia M.D.   On: 09/19/2019 06:32    Review of Systems  HENT: Negative for ear discharge, ear pain, hearing loss and tinnitus.   Eyes: Negative for photophobia and pain.  Respiratory: Negative for cough and shortness of breath.   Cardiovascular: Negative for chest pain.  Gastrointestinal: Negative for abdominal pain, nausea and vomiting.  Genitourinary: Negative for dysuria, flank pain, frequency and urgency.  Musculoskeletal: Positive for arthralgias (Left hip). Negative for back pain, myalgias and neck pain.  Neurological: Negative for dizziness and headaches.  Hematological: Does not bruise/bleed easily.  Psychiatric/Behavioral: The patient is not nervous/anxious.    Blood pressure (!) 107/57, pulse 77, temperature 97.6 F (36.4 C), temperature source Temporal, resp. rate (!) 22, height 5' 7" (1.702 m), weight 75.8 kg, SpO2 95 %. Physical Exam  Constitutional: He appears well-developed and well-nourished. No distress.  HENT:  Head: Normocephalic and  atraumatic.  Eyes: Conjunctivae are normal. Right eye exhibits no discharge. Left eye exhibits no discharge. No scleral icterus.  Cardiovascular: Normal rate and regular rhythm.  Respiratory: Effort normal. No respiratory distress.  Musculoskeletal:     Cervical back: Normal range of motion.     Comments: LLE No traumatic wounds, ecchymosis, or rash  Mild TTP hip  No knee or ankle effusion  Knee stable to varus/ valgus and anterior/posterior stress, referred pain to hip  Sens DPN, SPN, TN intact  Motor EHL, ext, flex, evers 5/5  DP 1+, PT 1+, No significant edema  Neurological: He is alert.  Skin: Skin is warm and dry. He is not  diaphoretic.  Psychiatric: He has a normal mood and affect. His behavior is normal.    Assessment/Plan: Left acet fx/rami fxs -- Plan TDWB LLE. F/u with Dr. Marcelino Scot in office in 2-3 weeks. Multiple medical problems including afib on coumadin, COPD, and CHF -- per primary service    Lisette Abu, PA-C Orthopedic Surgery [phone] 09/19/2019, 10:14 AM

## 2019-09-19 NOTE — H&P (Signed)
History and Physical    Chad Buchanan. [id number] DOB: 1921/01/09 DOA: 09/19/2019  Referring MD/NP/PA: Marye Round, MD PCP: Chad Infante, MD  Patient coming from: Home via EMS  Chief Complaint: Fall  I have personally briefly reviewed patient's old medical records in [place]   HPI: Chad Buchanan. is a 86 y.o. male with medical history significant of hypertension, paroxysmal atrial fibrillation on Coumadin, COPD, chronic diastolic CHF last EF 50- [id number] in 2016, aortic aneurysm, and BPH presents after having a fall at home around 1 AM.  Patient reports that he is not exactly sure of how he fell, but thinks that his foot slipped causing him to fall landed on his left side in the kitchen.  He does not recall losing consciousness or hitting his head.  He had to call out to his grandson to help him get up and he was able to make it to his bedroom.  He complained of pain in his left hip and shoulder and was unable to get back up.  Pain was worsened with any kind of movement.  Associated symptoms include muscle spasms and shortness of breath due to.  At baseline he does not report being on home oxygen.  He is on blood thinners of Coumadin.  ED Course: Upon admission into the emergency department patient was noted to be afebrile with  O2 saturations noted as low as 83% with improvement on 2 L of nasal cannula oxygen.  CT scan of the brain showed no acute abnormalities.  Labs significant for hemoglobin 9.6, platelets 114, BUN 38, creatinine 1.73, glucose 140, and BNP 153.8.  Chest x-ray noted chronic diffuse interstitial lung disease unchanged.  CT imaging revealed a nondisplaced fracture of the left obturator ring and acetabulum along with signs of extraperitoneal hemorrhage.  COVID-19 and influenza screening were negative.  Orthopedics was consulted, but recommended.  Patient had been given fentanyl and albuterol inhaler.  TRH called to admit.  Review of Systems  Constitutional: Negative  for fever.  HENT: Positive for hearing loss. Negative for congestion and ear discharge.   Eyes: Negative for photophobia and pain.  Respiratory: Positive for shortness of breath.   Cardiovascular: Negative for chest pain and leg swelling.  Gastrointestinal: Negative for abdominal pain and vomiting.  Genitourinary: Negative for dysuria and hematuria.  Musculoskeletal: Positive for falls, joint pain and myalgias.  Skin: Negative for itching.  Neurological: Negative for focal weakness and loss of consciousness.  Psychiatric/Behavioral: Negative for substance abuse.    Past Medical History:  Diagnosis Date  . A-fib ([place])   . AAA (abdominal aortic aneurysm) ([place])   . CHF (congestive heart failure) ([place])   . COPD (chronic obstructive pulmonary disease) ([place])     Past Surgical History:  Procedure Laterality Date  . ABDOMINAL AORTIC ANEURYSM REPAIR    . EYE SURGERY       reports that he has quit smoking. He does not have any smokeless tobacco history on file. No history on file for alcohol and drug.  Allergies  Allergen Reactions  . Oxycodone   . Tramadol     No family history on file.  Prior to Admission medications   Not on File    Physical Exam:  Constitutional: Elderly male who appears to be in discomfort with any kind of movement Vitals:   09/19/19 0900 09/19/19 0915 09/19/19 0945 09/19/19 1015  BP: 131/74 123/74 (!) 107/57 (!) 112/56  Pulse: 82 80 77 79  Resp: Marland Kitchen)  27 (!) 29 (!) 22 (!) 25  Temp:      TempSrc:      SpO2: 94% 96% 95% 94%  Weight:      Height:       Eyes: PERRL, lids and conjunctivae normal ENMT: Mucous membranes are moist. Posterior pharynx clear of any exudate or lesions. Poor dentition Neck: normal, supple, no masses, no thyromegaly Respiratory: Decreased overall aeration with expiratory wheeze appreciated.Tenderness to palpation of the chest wall. Cardiovascular: Regular rate and rhythm, no murmurs / rubs / gallops. Trace lower extremity  edema. 2+ pedal pulses. No carotid bruits.  Abdomen: no tenderness, no masses palpated. No hepatosplenomegaly. Bowel sounds positive.  Musculoskeletal: no clubbing / cyanosis. Left hip pain. Skin: no rashes, lesions, ulcers. No induration Neurologic: CN 2-12 grossly intact. Sensation intact, DTR normal. Strength 5/5 in all 4.  Psychiatric: Normal judgment and insight. Alert and oriented x 3. Normal mood.     Labs on Admission: I have personally reviewed following labs and imaging studies  CBC: Recent Labs  Lab 09/19/19 0606  WBC 8.2  NEUTROABS 7.1  HGB 9.6*  HCT 32.1*  MCV 95.8  PLT [id number]*   Basic Metabolic Panel: Recent Labs  Lab 09/19/19 0606  NA 142  K 4.5  CL 107  CO2 27  GLUCOSE 140*  BUN 38*  CREATININE 1.73*  CALCIUM 8.9   GFR: Estimated Creatinine Clearance: 23.3 mL/min (A) (by C-G formula based on SCr of 1.73 mg/dL (H)). Liver Function Tests: No results for input(s): AST, ALT, ALKPHOS, BILITOT, PROT, ALBUMIN in the last 168 hours. No results for input(s): LIPASE, AMYLASE in the last 168 hours. No results for input(s): AMMONIA in the last 168 hours. Coagulation Profile: Recent Labs  Lab 09/19/19 0606  INR 2.5*   Cardiac Enzymes: No results for input(s): CKTOTAL, CKMB, CKMBINDEX, TROPONINI in the last 168 hours. BNP (last 3 results) No results for input(s): PROBNP in the last 8760 hours. HbA1C: No results for input(s): HGBA1C in the last 72 hours. CBG: No results for input(s): GLUCAP in the last 168 hours. Lipid Profile: No results for input(s): CHOL, HDL, LDLCALC, TRIG, CHOLHDL, LDLDIRECT in the last 72 hours. Thyroid Function Tests: No results for input(s): TSH, T4TOTAL, FREET4, T3FREE, THYROIDAB in the last 72 hours. Anemia Panel: No results for input(s): VITAMINB12, FOLATE, FERRITIN, TIBC, IRON, RETICCTPCT in the last 72 hours. Urine analysis: No results found for: COLORURINE, APPEARANCEUR, LABSPEC, PHURINE, GLUCOSEU, HGBUR, BILIRUBINUR,  KETONESUR, PROTEINUR, UROBILINOGEN, NITRITE, LEUKOCYTESUR Sepsis Labs: No results found for this or any previous visit (from the past 240 hour(s)).   Radiological Exams on Admission: DG Chest 2 View  Result Date: 09/19/2019 CLINICAL DATA:  Hypoxia EXAM: CHEST - 2 VIEW COMPARISON:  Sep 26, 2017 FINDINGS: There is marked cardiomegaly as on the prior exam. Aortic knob calcifications. Diffusely increased interstitial markings with reticulonodular opacities throughout both lungs which are grossly unchanged from the prior exam. There is elevation of the right hemidiaphragm. No large airspace consolidation or pleural effusion. No acute osseous abnormality. IMPRESSION: No significant change in the diffuse chronic interstitial lung changes. Moderate cardiomegaly. Electronically Signed   By: Prudencio Pair M.D.   On: 09/19/2019 06:36   CT Head Wo Contrast  Result Date: 09/19/2019 CLINICAL DATA:  Head trauma EXAM: CT HEAD WITHOUT CONTRAST TECHNIQUE: Contiguous axial images were obtained from the base of the skull through the vertex without intravenous contrast. COMPARISON:  Sep 26, 2017 FINDINGS: Brain: No evidence of acute territorial infarction, hemorrhage, hydrocephalus,extra-axial  collection or mass lesion/mass effect. There is dilatation the ventricles and sulci consistent with age-related atrophy. Low-attenuation changes in the deep white matter consistent with small vessel ischemia. Vascular: No hyperdense vessel or unexpected calcification. Calcifications are seen within the internal carotid artery. Skull: The skull is intact. No fracture or focal lesion identified. Sinuses/Orbits: Sphenoid air cell mucosal thickening and retention cysts are seen. The orbits and globes intact. Other: None IMPRESSION: No acute intracranial abnormality. Findings consistent with age related atrophy and chronic small vessel ischemia Electronically Signed   By: Prudencio Pair M.D.   On: 09/19/2019 06:50   CT PELVIS WO  CONTRAST  Result Date: 09/19/2019 CLINICAL DATA:  Fall at home with left hip pain. EXAM: CT PELVIS WITHOUT CONTRAST TECHNIQUE: Multidetector CT imaging of the pelvis was performed following the standard protocol without intravenous contrast. COMPARISON:  Radiography from earlier today FINDINGS: Urinary Tract: Full urinary bladder. ~6 cm high-density mass at the right flank where there was a cyst seen previously. The finding is incompletely covered. Bowel:  No acute finding Vascular/Lymphatic: Diffuse atherosclerotic calcification. Remote aorto bi-iliac stenting. Reproductive:  Enlarged bladder up lifting the bladder base. Other:  Fatty bilateral inguinal and umbilical hernias. Musculoskeletal: Branching fracture at the level of the left acetabulum extending from the puboacetabular junction posteriorly and superiorly along the ilium. On coronal reformats there is a distinct fracture through the roof the lateral superior left acetabulum. No displacement along the articular surface. Nondisplaced fracture through the left inferior pubic ramus best seen on reformats. Both proximal femora are located and intact. No visible sacral insufficiency fracture. Advanced lumbar spine degeneration. IMPRESSION: 1. Nondisplaced fracture through the left obturator ring and acetabulum. 2. Mild extraperitoneal hemorrhage in the left pelvis. 3. 6 cm partially covered high-density mass at the right flank where there was a renal cyst seen in 2012. This presumably reflects intracystic hemorrhage, but is incompletely visualized and solid mass is not excluded. Enhanced CT or renal ultrasound could be attempted if appropriate for comorbidities. 4. Additional incidental findings noted above. Electronically Signed   By: Monte Fantasia M.D.   On: 09/19/2019 07:49   DG Hip Unilat  With Pelvis 2-3 Views Right  Result Date: 09/19/2019 CLINICAL DATA:  Fall. EXAM: DG HIP (WITH OR WITHOUT PELVIS) 2-3V RIGHT COMPARISON:  None. FINDINGS:  Generalized osteopenia. No dislocation or visible hip fracture. Aortic iliac stenting. IMPRESSION: No evidence of fracture. Electronically Signed   By: Monte Fantasia M.D.   On: 09/19/2019 06:32    EKG: Independently reviewed.  Atrial fibrillation at 82 bpm  Assessment/Plan Left acetabular and left ramus fracture  Secondary to fall: Acute.  Patient presents after having a suspected mechanical fall at home.  Found to have acute pelvic fracture -Admit to a medical telemetry bed -PT/OT to eval and treat  -Fentanyl as needed pain -Robaxin as needed for muscle spasm -Follow-up telemetry overnight -Appreciate orthopedic consultative services, we will follow-up for any further recommendation  Acute respiratory failure with hypoxia: Patient was noted to have initial O2 sats as low as 70% on room air placed on 4 L nasal cannula oxygen improvement of O2 saturations.  Patient with mild expiratory wheezes appreciated intermittently.  Patient with a significant history of tobacco use. -Continuous pulse oximetry with nasal cannula oxygen to maintain O2 saturations -Incentive spirometry  Extraperitoneal hemorrhage Possible intracystic hemorrhage Normocytic anemia: Acute.  Patient on anticoagulation of Coumadin with therapeutic INR.  After fall patient noted to have a extraperitoneal hemorrhage in the left pelvis with  signs of possible intracystic hemorrhage of the right kidney.  Hemoglobin noted to be 9.6 g/dL which appears in around patient's baseline which appears to be normally around 10 -Continue to monitor H&H  Acute kidney injury: Patient baseline creatinine previously noted to be around 1.  He presents with creatinine elevated up to 1.73 with BUN 38.  Distention was noted on CT scan of the abdomen and pelvis. -Check urine urea, urine creatinine, urine sodium -Check bladder scan every 6 hours -Check urinalysis -Orders placed for in and out catheter have patient seem to be retaining  urine  Chronic atrial fibrillation on chronic anticoagulation: INR therapeutic at 2.5. -Hold coumadin due to concern of bleeding  Thrombocytopenia: Chronic. Platelet count 114.  DNR: Present on admission   DVT prophylaxis: scds Code Status: DNR Family Communication: unable to reach family Disposition Plan: To be determined Consults called: Orthopedics Admission status: inpatient   Norval Morton MD [facility] Pager [phone]   If 7PM-7AM, please contact night-coverage [email or web address] Password [facility]  09/19/2019, 10:37 AM

## 2019-09-19 NOTE — ED Provider Notes (Addendum)
Pt seen by Dr Dina Rich.  Please see her note.  Pt has hx of COPD and is not normally on oxygen.  Labs reviewed.  Stable anemia.  CT scan shows  1. Nondisplaced fracture through the left obturator ring and  acetabulum.  2. Mild extraperitoneal hemorrhage in the left pelvis.  3. 6 cm partially covered high-density mass at the right flank where  there was a renal cyst seen in 2012. This presumably reflects  intracystic hemorrhage, but is incompletely visualized and solid  mass is not excluded. Enhanced CT or renal ultrasound could be  attempted if appropriate for comorbidities.  4. Additional incidental findings noted above.   Pt now also complains of shoulder pain.  Will xray.  Pt unable to ambulate.  With his advanced age will plan on admission.  Fx is typially weight bearing, will consult ortho.  Anticipate need for pt ot.   Case discussed with ortho, PA Jefferey.  Will consult on pt.  EKG Interpretation  Date/Time:  Tuesday September 19 2019 06:52:48 EDT Ventricular Rate:  82 PR Interval:    QRS Duration: 113 QT Interval:  365 QTC Calculation: 427 R Axis:   -51 Text Interpretation: Atrial fibrillation LAD, consider left anterior fascicular block Abnormal R-wave progression, late transition No old tracing to compare Confirmed by Dorie Rank [phone]) on 09/19/2019 9:04:44 AM         Dorie Rank, MD 09/19/19 [id number]    Dorie Rank, MD 09/19/19 [id number]

## 2019-09-19 NOTE — Plan of Care (Signed)

## 2019-09-19 NOTE — ED Notes (Signed)
Son at bedside.

## 2019-09-20 ENCOUNTER — Encounter (HOSPITAL_COMMUNITY): Payer: Self-pay | Admitting: Internal Medicine

## 2019-09-20 ENCOUNTER — Other Ambulatory Visit: Payer: Self-pay

## 2019-09-20 DIAGNOSIS — R58 Hemorrhage, not elsewhere classified: Secondary | ICD-10-CM | POA: Diagnosis present

## 2019-09-20 DIAGNOSIS — S32599A Other specified fracture of unspecified pubis, initial encounter for closed fracture: Secondary | ICD-10-CM | POA: Diagnosis present

## 2019-09-20 DIAGNOSIS — I482 Chronic atrial fibrillation, unspecified: Secondary | ICD-10-CM | POA: Diagnosis present

## 2019-09-20 DIAGNOSIS — S329XXA Fracture of unspecified parts of lumbosacral spine and pelvis, initial encounter for closed fracture: Secondary | ICD-10-CM

## 2019-09-20 DIAGNOSIS — J9600 Acute respiratory failure, unspecified whether with hypoxia or hypercapnia: Secondary | ICD-10-CM | POA: Diagnosis present

## 2019-09-20 DIAGNOSIS — Z66 Do not resuscitate: Secondary | ICD-10-CM | POA: Diagnosis present

## 2019-09-20 DIAGNOSIS — N2889 Other specified disorders of kidney and ureter: Secondary | ICD-10-CM

## 2019-09-20 DIAGNOSIS — S32402A Unspecified fracture of left acetabulum, initial encounter for closed fracture: Secondary | ICD-10-CM | POA: Diagnosis present

## 2019-09-20 DIAGNOSIS — S32592B Other specified fracture of left pubis, initial encounter for open fracture: Secondary | ICD-10-CM

## 2019-09-20 DIAGNOSIS — N179 Acute kidney failure, unspecified: Secondary | ICD-10-CM | POA: Diagnosis present

## 2019-09-20 DIAGNOSIS — J449 Chronic obstructive pulmonary disease, unspecified: Secondary | ICD-10-CM

## 2019-09-20 LAB — CBC
HCT: 26.8 % — ABNORMAL LOW (ref 39.0–52.0)
Hemoglobin: 8.2 g/dL — ABNORMAL LOW (ref 13.0–17.0)
MCH: 29 pg (ref 26.0–34.0)
MCHC: 30.6 g/dL (ref 30.0–36.0)
MCV: 94.7 fL (ref 80.0–100.0)
Platelets: 109 10*3/uL — ABNORMAL LOW (ref 150–400)
RBC: 2.83 MIL/uL — ABNORMAL LOW (ref 4.22–5.81)
RDW: 14.7 % (ref 11.5–15.5)
WBC: 5.8 10*3/uL (ref 4.0–10.5)
nRBC: 0 % (ref 0.0–0.2)

## 2019-09-20 LAB — BASIC METABOLIC PANEL
Anion gap: 6 (ref 5–15)
BUN: 34 mg/dL — ABNORMAL HIGH (ref 8–23)
CO2: 26 mmol/L (ref 22–32)
Calcium: 8.7 mg/dL — ABNORMAL LOW (ref 8.9–10.3)
Chloride: 106 mmol/L (ref 98–111)
Creatinine, Ser: 1.56 mg/dL — ABNORMAL HIGH (ref 0.61–1.24)
GFR calc Af Amer: 43 mL/min — ABNORMAL LOW (ref >60)
GFR calc non Af Amer: 37 mL/min — ABNORMAL LOW (ref >60)
Glucose, Bld: 101 mg/dL — ABNORMAL HIGH (ref 70–99)
Potassium: 4.6 mmol/L (ref 3.5–5.1)
Sodium: 138 mmol/L (ref 135–145)

## 2019-09-20 LAB — RETICULOCYTES
Immature Retic Fract: 12.8 % (ref 2.3–15.9)
RBC.: 2.9 MIL/uL — ABNORMAL LOW (ref 4.22–5.81)
Retic Count, Absolute: 43.5 10*3/uL (ref 19.0–186.0)
Retic Ct Pct: 1.5 % (ref 0.4–3.1)

## 2019-09-20 LAB — IRON AND TIBC
Iron: 21 ug/dL — ABNORMAL LOW (ref 45–182)
Saturation Ratios: 10 % — ABNORMAL LOW (ref 17.9–39.5)
TIBC: 207 ug/dL — ABNORMAL LOW (ref 250–450)
UIBC: 186 ug/dL

## 2019-09-20 LAB — UREA NITROGEN, URINE: Urea Nitrogen, Ur: 600 mg/dL

## 2019-09-20 LAB — PROTIME-INR
INR: 3.3 — ABNORMAL HIGH (ref 0.8–1.2)
Prothrombin Time: 32.3 seconds — ABNORMAL HIGH (ref 11.4–15.2)

## 2019-09-20 LAB — VITAMIN B12: Vitamin B-12: 174 pg/mL — ABNORMAL LOW (ref 180–914)

## 2019-09-20 LAB — FOLATE: Folate: 5.6 ng/mL — ABNORMAL LOW (ref >5.9)

## 2019-09-20 LAB — FERRITIN: Ferritin: 130 ng/mL (ref 24–336)

## 2019-09-20 MED ORDER — OXYCODONE HCL ER 10 MG PO T12A
10.0000 mg | EXTENDED_RELEASE_TABLET | Freq: Two times a day (BID) | ORAL | Status: DC
  Administered 2019-09-20 – 2019-09-21 (×3): 10 mg via ORAL
  Filled 2019-09-20 (×3): qty 1

## 2019-09-20 MED ORDER — ALBUTEROL SULFATE (2.5 MG/3ML) 0.083% IN NEBU
2.5000 mg | INHALATION_SOLUTION | Freq: Two times a day (BID) | RESPIRATORY_TRACT | Status: DC
  Administered 2019-09-20: 2.5 mg via RESPIRATORY_TRACT
  Filled 2019-09-20 (×2): qty 3

## 2019-09-20 MED ORDER — SODIUM CHLORIDE 0.9 % IV SOLN
1.0000 mg | Freq: Every day | INTRAVENOUS | Status: DC
  Administered 2019-09-20: 1 mg via INTRAVENOUS
  Filled 2019-09-20 (×2): qty 0.2

## 2019-09-20 MED ORDER — VITAMIN B-12 100 MCG PO TABS
50.0000 ug | ORAL_TABLET | Freq: Every day | ORAL | Status: DC
  Administered 2019-09-20 – 2019-09-25 (×5): 50 ug via ORAL
  Filled 2019-09-20 (×7): qty 1

## 2019-09-20 MED ORDER — CYANOCOBALAMIN 1000 MCG/ML IJ SOLN
1000.0000 ug | Freq: Once | INTRAMUSCULAR | Status: AC
  Administered 2019-09-20: 1000 ug via INTRAMUSCULAR
  Filled 2019-09-20: qty 1

## 2019-09-20 MED ORDER — ALBUTEROL SULFATE (2.5 MG/3ML) 0.083% IN NEBU: 2.5000 mg | INHALATION_SOLUTION | RESPIRATORY_TRACT | Status: DC | PRN

## 2019-09-20 NOTE — Evaluation (Signed)
Occupational Therapy Evaluation Patient Details Name: Chad Buchanan. MRN: [id number] DOB: 05/24/1921 Today's Date: 09/20/2019    History of Present Illness Pt is a 86 y/o male admitted following fall. Found to have L acetabular and obturator fx. Also with mild extraperitoneal hemorrhage in L pelvis. PMH includes a fib, CHF, COPD, and AAA.    Clinical Impression   Pt PTA: Pt lives with family, is alone during the day, but has care in AM and after work hours. Pt was independent with mobility- mostly with SPC. Pt currently limited by HOH, decreased strength, decreased activity tolerance, decreased ability to care for self care and decreased pain. Pt limited by LUE pain in shoulder with negative xrays. Pt minA to [place] for ADL at this time. Pt maxA+2 for sit to stand- try lateral scoot next session if pt unable to attempt TDWB on LLE with RW. Pt would benefit from continued OT skilled services for ADL, mobility and safety.  OT following acutely.    Follow Up Recommendations  SNF;Supervision/Assistance - 24 hour    Equipment Recommendations  (defer to next facility)    Recommendations for Other Services       Precautions / Restrictions Precautions Precautions: Fall Precaution Comments: Pt with fall at home.  Restrictions Weight Bearing Restrictions: Yes LLE Weight Bearing: Touchdown weight bearing      Mobility Bed Mobility Overal bed mobility: Needs Assistance Bed Mobility: Supine to Sit;Sit to Supine     Supine to sit: Max assist;+2 for physical assistance Sit to supine: Max assist;+2 for physical assistance   General bed mobility comments: Max A  +2 for trunk and LE assist. Increased time required secondary to pain.   Transfers Overall transfer level: Needs assistance Equipment used: Rolling walker (2 wheeled) Transfers: Sit to/from Stand Sit to Stand: Max assist;+2 physical assistance;From elevated surface         General transfer comment: Required max A +2 to  stand using RW. Cues to maintain TDWB, however, pt unable to maintain, so returned to sitting.     Balance Overall balance assessment: Needs assistance Sitting-balance support: No upper extremity supported;Feet supported Sitting balance-[name]: Fair     Standing balance support: Bilateral upper extremity supported;During functional activity Standing balance-[name]: Poor Standing balance comment: Reliant on max A +2 to maintain standing balance; bed pad utilized                           ADL either performed or assessed with clinical judgement   ADL Overall ADL's : Needs assistance/impaired Eating/Feeding: Set up;Bed level Eating/Feeding Details (indicate cue type and reason): supported sitting Grooming: Minimal assistance;Bed level;Sitting Grooming Details (indicate cue type and reason): supported sittng Upper Body Bathing: Moderate assistance;Sitting;Bed level Upper Body Bathing Details (indicate cue type and reason): LUE pain Lower Body Bathing: Total assistance;Sitting/lateral leans;Bed level;Sit to/from stand Lower Body Bathing Details (indicate cue type and reason): can partially come to standing  Upper Body Dressing : Moderate assistance;Sitting   Lower Body Dressing: Total assistance;Sit to/from stand;Sitting/lateral leans;Cueing for sequencing   Toilet Transfer: Total assistance Toilet Transfer Details (indicate cue type and reason): will need to use lift; maxA +2 for STS Toileting- Clothing Manipulation and Hygiene: Total assistance       Functional mobility during ADLs: Maximal assistance;+2 for physical assistance;+2 for safety/equipment;Rolling walker;Cueing for safety;Cueing for sequencing General ADL Comments: Pt limited by pain in LUE, decreased strength, decreased ability to care for self and decreased  activity tolerance     Vision Baseline Vision/History: No visual deficits Patient Visual Report: No change from baseline Vision  Assessment?: No apparent visual deficits     Perception     Praxis      Pertinent Vitals/Pain Pain Assessment: Faces Faces Pain Scale: Hurts whole lot Pain Location: L hip and L arm Pain Descriptors / Indicators: Grimacing;Guarding Pain Intervention(s): Monitored during session;Premedicated before session     Hand Dominance Left   Extremity/Trunk Assessment Upper Extremity Assessment Upper Extremity Assessment: Generalized weakness;RUE deficits/detail;LUE deficits/detail RUE Deficits / Details: WFLs, AROM LUE Deficits / Details: LUE shoulder ROM to 45* in FF and 30* sh sbd; elbow AAROM, WFLs; 5/5 grip strength   Lower Extremity Assessment Lower Extremity Assessment: LLE deficits/detail LLE Deficits / Details: pain   Cervical / Trunk Assessment Cervical / Trunk Assessment: Kyphotic   Communication Communication Communication: HOH   Cognition Arousal/Alertness: Awake/alert Behavior During Therapy: WFL for tasks assessed/performed Overall Cognitive Status: Within Functional Limits for tasks assessed                                 General Comments: Very HOH. Able to answer all PLOF and jovial with staff   General Comments  daughter in room for PLOF info due to pt HOH; O2 on 2-3L required for >90%.    Exercises     Shoulder Instructions      Home Living Family/patient expects to be discharged to:: Private residence Living Arrangements: Other relatives(niece and nephew) Available Help at Discharge: Family;Available PRN/intermittently Type of Home: House Home Access: Stairs to enter [employer] of Steps: 1 +1 step to den area with rails   Home Layout: Multi-level;Able to live on main level with bedroom/bathroom     Bathroom Shower/Tub: Teacher, early years/pre: Standard     Home Equipment: Environmental consultant - 2 wheels;Cane - single point;Bedside commode   Additional Comments: Family living in basement and working during the day, but  stay over each night.      Prior Functioning/Environment Level of Independence: Needs assistance  Gait / Transfers Assistance Needed: SPC for outside of home and RW for inside of home ADL's / Homemaking Assistance Needed: Bathing at sink; independent; IADLs can make simple meals or reheat. Family assists as needed.   Comments: cleaning service x1 month; 4 children split up tasks for a few days weekly        OT Problem List: Decreased strength;Decreased activity tolerance;Impaired balance (sitting and/or standing);Decreased safety awareness;Decreased knowledge of use of DME or AE;Decreased knowledge of precautions;Impaired UE functional use;Pain;Increased edema      OT Treatment/Interventions: Self-care/ADL training;Therapeutic exercise;Energy conservation;DME and/or AE instruction;Therapeutic activities;Patient/family education;Balance training    OT Goals(Current goals can be found in the care plan section) Acute Rehab OT Goals Patient Stated Goal: to be able to walk and go to rehab OT Goal Formulation: With patient/family Time For Goal Achievement: 10/04/19 Potential to Achieve Goals: Good ADL Goals Pt Will Perform Grooming: with set-up;sitting Pt Will Transfer to Toilet: with min assist;with +2 assist;stand pivot transfer;squat pivot transfer;bedside commode Pt/caregiver will Perform Home Exercise Program: Increased strength;Both right and left upper extremity;With Supervision;With minimal assist Additional ADL Goal #1: Pt will improve to minA overall for bed mobility as a precursor for ADL Additional ADL Goal #2: Pt will increase to tolerating x10 mins at EOB or unsupported sitting with fair balance in prep for ADL.  OT Frequency: Min  2X/week   Barriers to D/C:            Co-evaluation PT/OT/SLP Co-Evaluation/Treatment: Yes Reason for Co-Treatment: Complexity of the patient's impairments (multi-system involvement);To address functional/ADL transfers PT goals addressed  during session: Mobility/safety with mobility;Balance OT goals addressed during session: ADL's and self-care      AM-PAC OT "6 Clicks" Daily Activity     Outcome Measure Help from another person eating meals?: A Little Help from another person taking care of personal grooming?: A Little Help from another person toileting, which includes using toliet, bedpan, or urinal?: Total Help from another person bathing (including washing, rinsing, drying)?: Total Help from another person to put on and taking off regular upper body clothing?: A Lot Help from another person to put on and taking off regular lower body clothing?: Total 6 Click Score: 11   End of Session Equipment Utilized During Treatment: Gait belt;Rolling walker;Oxygen Nurse Communication: Mobility status;Need for lift equipment;Weight bearing status  Activity Tolerance: Patient tolerated treatment well Patient left: in bed;with call bell/phone within reach;with bed alarm set;with family/visitor present  OT Visit Diagnosis: Unsteadiness on feet (R26.81);Muscle weakness (generalized) (M62.81);Pain Pain - Right/Left: Left Pain - part of body: Hip;Shoulder                Time: [id number] OT Time Calculation (min): 31 min Charges:  OT General Charges $OT Visit: 1 Visit OT Evaluation $OT Eval Moderate Complexity: 1 Mod  Jefferey Pica, OTR/L Acute Rehabilitation Services Pager: [phone] Office: [phone]    Jefferey Pica 09/20/2019, 12:37 PM

## 2019-09-20 NOTE — Evaluation (Signed)
Physical Therapy Evaluation Patient Details Name: Chad Buchanan. MRN: [id number] DOB: 1920-12-26 Today's Date: 09/20/2019   History of Present Illness  Pt is a 86 y/o male admitted following fall. Found to have L acetabular and obturator fx. Also with mild extraperitoneal hemorrhage in L pelvis. PMH includes a fib, CHF, COPD, and AAA.   Clinical Impression  Pt admitted secondary to problem above with deficits below. Pt requiring max A +2 to perform bed mobility and to stand. Pt with increased pain in LUE and L pelvis area. Feel pt will benefit from SNF level therapies at d/c to increase independence and safety with mobility prior to return home. Will continue to follow acutely to maximize functional mobility independence and safety.     Follow Up Recommendations SNF;Supervision/Assistance - 24 hour    Equipment Recommendations  Wheelchair (measurements PT);Wheelchair cushion (measurements PT)    Recommendations for Other Services       Precautions / Restrictions Precautions Precautions: Fall Precaution Comments: Pt with fall at home.  Restrictions Weight Bearing Restrictions: Yes LLE Weight Bearing: Touchdown weight bearing      Mobility  Bed Mobility Overal bed mobility: Needs Assistance Bed Mobility: Supine to Sit;Sit to Supine     Supine to sit: Max assist;+2 for physical assistance Sit to supine: Max assist;+2 for physical assistance   General bed mobility comments: Max A  +2 for trunk and LE assist. Increased time required secondary to pain.   Transfers Overall transfer level: Needs assistance Equipment used: Rolling walker (2 wheeled) Transfers: Sit to/from Stand Sit to Stand: Max assist;+2 physical assistance;From elevated surface         General transfer comment: Required max A +2 to stand using RW. Cues to maintain TDWB, however, pt unable to maintain, so returned to sitting.   Ambulation/Gait             General Gait Details: unable  Stairs             Wheelchair Mobility    Modified Rankin (Stroke Patients Only)       Balance Overall balance assessment: Needs assistance Sitting-balance support: No upper extremity supported;Feet supported Sitting balance-[name]: Fair     Standing balance support: Bilateral upper extremity supported;During functional activity Standing balance-[name]: Poor Standing balance comment: Reliant on max A +2 to maintain standing balance                             Pertinent Vitals/Pain Pain Assessment: Faces Faces Pain Scale: Hurts whole lot Pain Location: L hip and L arm Pain Descriptors / Indicators: Grimacing;Guarding Pain Intervention(s): Monitored during session;Limited activity within patient's tolerance;Repositioned    Home Living Family/patient expects to be discharged to:: Private residence Living Arrangements: Other relatives(niece and nephew) Available Help at Discharge: Family;Available PRN/intermittently Type of Home: House Home Access: Stairs to enter   [employer] of Steps: 1 +1 step to den area with rails Home Layout: Multi-level;Able to live on main level with bedroom/bathroom Home Equipment: Gilford Rile - 2 wheels;Cane - single point;Bedside commode Additional Comments: FAmily living in basement and working during the day    Prior Function Level of Independence: Needs assistance   Gait / Transfers Assistance Needed: [facility] for outside of home and RW for inside of home  ADL's / Homemaking Assistance Needed: Bathing at sink; independent; IADLs can make simple meals or reheat. Family assists as needed.  Comments: cleaning service x1 month; 4 children split  up tasks for a few days weekly     Hand Dominance        Extremity/Trunk Assessment   Upper Extremity Assessment Upper Extremity Assessment: Defer to OT evaluation    Lower Extremity Assessment Lower Extremity Assessment: LLE deficits/detail;Generalized weakness LLE Deficits  / Details: Limited ROM secondary to pain.     Cervical / Trunk Assessment Cervical / Trunk Assessment: Kyphotic  Communication   Communication: HOH  Cognition Arousal/Alertness: Awake/alert Behavior During Therapy: WFL for tasks assessed/performed Overall Cognitive Status: Within Functional Limits for tasks assessed                                        General Comments General comments (skin integrity, edema, etc.): daughter in room for PLOF info due to pt [facility]    Exercises     Assessment/Plan    PT Assessment Patient needs continued PT services  PT Problem List Decreased strength;Decreased range of motion;Decreased balance;Decreased mobility;Decreased knowledge of use of DME;Decreased knowledge of precautions;Pain       PT Treatment Interventions Gait training;DME instruction;Functional mobility training;Therapeutic activities;Therapeutic exercise;Balance training;Patient/family education    PT Goals (Current goals can be found in the Care Plan section)  Acute Rehab PT Goals Patient Stated Goal: to be able to walk  PT Goal Formulation: With patient Time For Goal Achievement: 10/04/19 Potential to Achieve Goals: Good    Frequency Min 2X/week   Barriers to discharge        Co-evaluation PT/OT/SLP Co-Evaluation/Treatment: Yes Reason for Co-Treatment: To address functional/ADL transfers;For patient/therapist safety;Complexity of the patient's impairments (multi-system involvement) PT goals addressed during session: Mobility/safety with mobility;Balance         AM-PAC PT "6 Clicks" Mobility  Outcome Measure Help needed turning from your back to your side while in a flat bed without using bedrails?: Total Help needed moving from lying on your back to sitting on the side of a flat bed without using bedrails?: Total Help needed moving to and from a bed to a chair (including a wheelchair)?: Total Help needed standing up from a chair using your arms  (e.g., wheelchair or bedside chair)?: Total Help needed to walk in hospital room?: Total Help needed climbing 3-5 steps with a railing? : Total 6 Click Score: 6    End of Session Equipment Utilized During Treatment: Gait belt Activity Tolerance: Patient tolerated treatment well Patient left: in bed;with call bell/phone within reach;with bed alarm set Nurse Communication: Mobility status PT Visit Diagnosis: Muscle weakness (generalized) (M62.81);History of falling (Z91.81);Difficulty in walking, not elsewhere classified (R26.2);Pain Pain - Right/Left: Left Pain - part of body: Hip;Arm    Time: [id number] PT Time Calculation (min) (ACUTE ONLY): 29 min   Charges:   PT Evaluation $PT Eval Moderate Complexity: 1 Mod          Reuel Derby, PT, DPT  Acute Rehabilitation Services  Pager: [phone] Office: [phone]   Rudean Hitt 09/20/2019, 12:00 PM

## 2019-09-20 NOTE — Progress Notes (Signed)
PROGRESS NOTE    Chad Buchanan.  [id number] DOB: 26-Apr-1921 DOA: 09/19/2019 PCP: Crist Infante, MD     Brief Narrative:  86 y.o. male PMHx HTN, paroxysmal atrial fibrillation on Coumadin, chronic diastolic CHF last EF 50 to 55% in 2016, aortic aneurysm, and BPH   Presents after having a fall at home around 1 AM.  Patient reports that he is not exactly sure of how he fell, but thinks that his foot slipped causing him to fall landed on his left side in the kitchen.  He does not recall losing consciousness or hitting his head.  He had to call out to his grandson to help him get up and he was able to make it to his bedroom.  He complained of pain in his left hip and shoulder and was unable to get back up.  Pain was worsened with any kind of movement.  Associated symptoms include muscle spasms and shortness of breath due to.  At baseline he does not report being on home oxygen.  He is on blood thinners of Coumadin.  ED Course: Upon admission into the emergency department patient was noted to be afebrile with  O2 saturations noted as low as 83% with improvement on 2 L of nasal cannula oxygen.  CT scan of the brain showed no acute abnormalities.  Labs significant for hemoglobin 9.6, platelets 114, BUN 38, creatinine 1.73, glucose 140, and BNP 153.8.  Chest x-ray noted chronic diffuse interstitial lung disease unchanged.  CT imaging revealed a nondisplaced fracture of the left obturator ring and acetabulum along with signs of extraperitoneal hemorrhage.  COVID-19 and influenza screening were negative.  Orthopedics was consulted, but recommended no surgery..  Patient had been given fentanyl and albuterol inhaler.  [facility] called to admit.  Subjective: A/O x4, negative S OB, negative CP, negative abdominal pain.  States pain now under control.  States he just slipped and fell to the floor.  Did not strike any furniture on the way down, negative LOC.   Assessment & Plan:   Principal Problem:   Left  acetabular fracture ([place]) Active Problems:   Fall   AKI (acute kidney injury) ([place])   Chronic atrial fibrillation ([place])   Acute respiratory failure ([place])   Internal bleeding   DNR (do not resuscitate)   Pubic ramus fracture ([facility])   Acute LEFT Acetabular and left ramus fracture Secondary to fall:  -Orthopedics recommends [facility] LLE. F/u with Dr. Marcelino Scot in office in 2-3 weeks. -PT/OT recommend SNF  Pain control -Fentanyl 25 mcg PRN -Robaxin 500 mg TID PRN -OxyContin 10 mg BID  Acute respiratory failure with hypoxia -Titrate O2 to maintain SPO2> 92% - Incentive spirometry  Extraperitoneal hemorrhage  Possible intracystic hemorrhage  Macrocytic anemia -Anemia panel consistent with macrocytic anemia, low B12, low folate -B12 1000 mcg IM.---> B12 p.o. 50 mcg daily -Folate IV 1 mg daily Recent Labs  Lab 09/19/19 0606 09/20/19 0553  HGB 9.6* 8.2*  -Hemoglobin stable, continue to follow closely  Acute kidney injury (baseline Cr 1.0)/Acute Urine Retention Recent Labs  Lab 09/19/19 0606 09/20/19 0553  CREATININE 1.73* 1.56*  -Per EMR patient having episodes of urine retention, bladder scan  QID -Orders placed for in and out cath -If patient continues to have episodes of Urine Retention Place, Foley catheter -Urinalysis nondiagnostic for cause of urinary retention -If urine retention continues obtain kidney US  Chronic atrial fibrillation -On chronic Coumadin (held) secondary to bleed Recent Labs  Lab 09/19/19 0606 09/20/19 [id number]  INR 2.5* 3.3*  -Therapeutic -Will restart in the next 48 hours as long as no further episodes of bleeding internally. -Currently NSR with PVC  Thrombocytopenia (chronic) -Monitor closely -Currently no signs of overt bleeding  Goals of care -4/28 PT/OT; evaluate for CIR vs SNF   DVT prophylaxis: SCD Code Status: DNR Family Communication:  Disposition Plan:  1.  Where the patient is from 2.  Anticipated d/c place. 3.  Barriers to  d/c    Consultants:  Orthopedic surgery   Procedures/Significant Events:  4/27 CT pelvis Wo contrast;Nondisplaced fracture through the left obturator ring and acetabulum. 2. Mild extraperitoneal hemorrhage in the left pelvis. 3. 6 cm partially covered high-density mass at the right flank where there was a renal cyst seen in 2012. This presumably reflects intracystic hemorrhage, but is incompletely visualized and solid mass is not excluded. Enhanced CT or renal ultrasound could be attempted if appropriate for comorbidities. 4/27 CT head W0 contrast; negative acute finding    I have personally reviewed and interpreted all radiology studies and my findings are as above.  VENTILATOR SETTINGS:    Cultures   Antimicrobials:    Devices    LINES / TUBES:      Continuous Infusions:   Objective: Vitals:   09/19/19 2352 09/20/19 0442 09/20/19 0749 09/20/19 0900  BP: 116/66 (!) 103/59 (!) 99/58   Pulse: 69 66 80 80  Resp: 20 20 18 18   Temp: 98.6 F (37 C) 98.5 F (36.9 C) (!) 97.4 F (36.3 C)   TempSrc:   Oral   SpO2: (!) 89% 91% 91% 92%  Weight:  81.1 kg    Height:        Intake/Output Summary (Last 24 hours) at 09/20/2019 1029 Last data filed at 09/20/2019 0445 Gross per 24 hour  Intake 400 ml  Output 500 ml  Net -100 ml   Filed Weights   09/19/19 0559 09/20/19 0442  Weight: 75.8 kg 81.1 kg    Examination:  General: A/O x4, positive acute respiratory distress Eyes: negative scleral hemorrhage, negative anisocoria, negative icterus ENT: Negative Runny nose, negative gingival bleeding, HARD OF HEARING Neck:  Negative scars, masses, torticollis, lymphadenopathy, JVD Lungs: Clear to auscultation bilaterally without wheezes or crackles Cardiovascular: Regular rate and rhythm without murmur gallop or rub normal S1 and S2 Abdomen: negative abdominal pain, nondistended, positive soft, bowel sounds, no rebound, no ascites, no appreciable mass Extremities:  No significant cyanosis, clubbing, or edema bilateral lower extremities Skin: Negative rashes, lesions, ulcers Psychiatric:  Negative depression, negative anxiety, negative fatigue, negative mania  Central nervous system:  Cranial nerves II through XII intact, tongue/uvula midline, all extremities muscle strength 5/5, sensation intact throughout, negative dysarthria, negative expressive aphasia, negative receptive aphasia.  .     Data Reviewed: Care during the described time interval was provided by me .  I have reviewed this patient's available data, including medical history, events of note, physical examination, and all test results as part of my evaluation.  CBC: Recent Labs  Lab 09/19/19 0606 09/20/19 0553  WBC 8.2 5.8  NEUTROABS 7.1  --   HGB 9.6* 8.2*  HCT 32.1* 26.8*  MCV 95.8 94.7  PLT 114* 0000000*   Basic Metabolic Panel: Recent Labs  Lab 09/19/19 0606 09/20/19 0553  NA 142 138  K 4.5 4.6  CL 107 106  CO2 27 26  GLUCOSE 140* 101*  BUN 38* 34*  CREATININE 1.73* 1.56*  CALCIUM 8.9 8.7*   GFR: Estimated Creatinine Clearance:  30.4 mL/min (A) (by C-G formula based on SCr of 1.56 mg/dL (H)). Liver Function Tests: No results for input(s): AST, ALT, ALKPHOS, BILITOT, PROT, ALBUMIN in the last 168 hours. No results for input(s): LIPASE, AMYLASE in the last 168 hours. No results for input(s): AMMONIA in the last 168 hours. Coagulation Profile: Recent Labs  Lab 09/19/19 0606 09/20/19 0553  INR 2.5* 3.3*   Cardiac Enzymes: No results for input(s): CKTOTAL, CKMB, CKMBINDEX, TROPONINI in the last 168 hours. BNP (last 3 results) No results for input(s): PROBNP in the last 8760 hours. HbA1C: No results for input(s): HGBA1C in the last 72 hours. CBG: No results for input(s): GLUCAP in the last 168 hours. Lipid Profile: No results for input(s): CHOL, HDL, LDLCALC, TRIG, CHOLHDL, LDLDIRECT in the last 72 hours. Thyroid Function Tests: No results for input(s): TSH,  T4TOTAL, FREET4, T3FREE, THYROIDAB in the last 72 hours. Anemia Panel: No results for input(s): VITAMINB12, FOLATE, FERRITIN, TIBC, IRON, RETICCTPCT in the last 72 hours. Sepsis Labs: No results for input(s): PROCALCITON, LATICACIDVEN in the last 168 hours.  Recent Results (from the past 240 hour(s))  Respiratory Panel by RT PCR (Flu A&B, Covid) - Nasopharyngeal Swab     Status: None   Collection Time: 09/19/19 11:35 AM   Specimen: Nasopharyngeal Swab  Result Value Ref Range Status   SARS Coronavirus 2 by RT PCR NEGATIVE NEGATIVE Final    Comment: (NOTE) SARS-CoV-2 target nucleic acids are NOT DETECTED. The SARS-CoV-2 RNA is generally detectable in upper respiratoy specimens during the acute phase of infection. The lowest concentration of SARS-CoV-2 viral copies this assay can detect is 131 copies/mL. A negative result does not preclude SARS-Cov-2 infection and should not be used as the sole basis for treatment or other patient management decisions. A negative result may occur with  improper specimen collection/handling, submission of specimen other than nasopharyngeal swab, presence of viral mutation(s) within the areas targeted by this assay, and inadequate number of viral copies (<131 copies/mL). A negative result must be combined with clinical observations, patient history, and epidemiological information. The expected result is Negative. Fact Sheet for Patients:  PinkCheek.be Fact Sheet for Healthcare Providers:  GravelBags.it This test is not yet ap proved or cleared by the [place] FDA and  has been authorized for detection and/or diagnosis of SARS-CoV-2 by FDA under an Emergency Use Authorization (EUA). This EUA will remain  in effect (meaning this test can be used) for the duration of the COVID-19 declaration under Section 564(b)(1) of the Act, 21 U.S.C. section 360bbb-3(b)(1), unless the authorization is  terminated or revoked sooner.    Influenza A by PCR NEGATIVE NEGATIVE Final   Influenza B by PCR NEGATIVE NEGATIVE Final    Comment: (NOTE) The Xpert Xpress SARS-CoV-2/FLU/RSV assay is intended as an aid in  the diagnosis of influenza from Nasopharyngeal swab specimens and  should not be used as a sole basis for treatment. Nasal washings and  aspirates are unacceptable for Xpert Xpress SARS-CoV-2/FLU/RSV  testing. Fact Sheet for Patients: PinkCheek.be Fact Sheet for Healthcare Providers: GravelBags.it This test is not yet approved or cleared by the [place] FDA and  has been authorized for detection and/or diagnosis of SARS-CoV-2 by  FDA under an Emergency Use Authorization (EUA). This EUA will remain  in effect (meaning this test can be used) for the duration of the  Covid-19 declaration under Section 564(b)(1) of the Act, 21  U.S.C. section 360bbb-3(b)(1), unless the authorization is  terminated or revoked. Performed at  [facility], [place] [address], [place], [place] [address]          Radiology Studies: DG Chest 2 View  Result Date: 09/19/2019 CLINICAL DATA:  Hypoxia EXAM: CHEST - 2 VIEW COMPARISON:  Sep 26, 2017 FINDINGS: There is marked cardiomegaly as on the prior exam. Aortic knob calcifications. Diffusely increased interstitial markings with reticulonodular opacities throughout both lungs which are grossly unchanged from the prior exam. There is elevation of the right hemidiaphragm. No large airspace consolidation or pleural effusion. No acute osseous abnormality. IMPRESSION: No significant change in the diffuse chronic interstitial lung changes. Moderate cardiomegaly. Electronically Signed   By: Prudencio Pair M.D.   On: 09/19/2019 06:36   CT Head Wo Contrast  Result Date: 09/19/2019 CLINICAL DATA:  Head trauma EXAM: CT HEAD WITHOUT CONTRAST TECHNIQUE: Contiguous axial images were obtained from the base of  the skull through the vertex without intravenous contrast. COMPARISON:  Sep 26, 2017 FINDINGS: Brain: No evidence of acute territorial infarction, hemorrhage, hydrocephalus,extra-axial collection or mass lesion/mass effect. There is dilatation the ventricles and sulci consistent with age-related atrophy. Low-attenuation changes in the deep white matter consistent with small vessel ischemia. Vascular: No hyperdense vessel or unexpected calcification. Calcifications are seen within the internal carotid artery. Skull: The skull is intact. No fracture or focal lesion identified. Sinuses/Orbits: Sphenoid air cell mucosal thickening and retention cysts are seen. The orbits and globes intact. Other: None IMPRESSION: No acute intracranial abnormality. Findings consistent with age related atrophy and chronic small vessel ischemia Electronically Signed   By: Prudencio Pair M.D.   On: 09/19/2019 06:50   CT PELVIS WO CONTRAST  Result Date: 09/19/2019 CLINICAL DATA:  Fall at home with left hip pain. EXAM: CT PELVIS WITHOUT CONTRAST TECHNIQUE: Multidetector CT imaging of the pelvis was performed following the standard protocol without intravenous contrast. COMPARISON:  Radiography from earlier today FINDINGS: Urinary Tract: Full urinary bladder. ~6 cm high-density mass at the right flank where there was a cyst seen previously. The finding is incompletely covered. Bowel:  No acute finding Vascular/Lymphatic: Diffuse atherosclerotic calcification. Remote aorto bi-iliac stenting. Reproductive:  Enlarged bladder up lifting the bladder base. Other:  Fatty bilateral inguinal and umbilical hernias. Musculoskeletal: Branching fracture at the level of the left acetabulum extending from the puboacetabular junction posteriorly and superiorly along the ilium. On coronal reformats there is a distinct fracture through the roof the lateral superior left acetabulum. No displacement along the articular surface. Nondisplaced fracture through the  left inferior pubic ramus best seen on reformats. Both proximal femora are located and intact. No visible sacral insufficiency fracture. Advanced lumbar spine degeneration. IMPRESSION: 1. Nondisplaced fracture through the left obturator ring and acetabulum. 2. Mild extraperitoneal hemorrhage in the left pelvis. 3. 6 cm partially covered high-density mass at the right flank where there was a renal cyst seen in 2012. This presumably reflects intracystic hemorrhage, but is incompletely visualized and solid mass is not excluded. Enhanced CT or renal ultrasound could be attempted if appropriate for comorbidities. 4. Additional incidental findings noted above. Electronically Signed   By: Monte Fantasia M.D.   On: 09/19/2019 07:49   DG Shoulder Left  Result Date: 09/19/2019 CLINICAL DATA:  Fall with left shoulder pain. EXAM: LEFT SHOULDER - 2+ VIEW COMPARISON:  None. FINDINGS: Mild degenerate changes of the [facility] joint and glenohumeral joints. No evidence of acute fracture or dislocation. IMPRESSION: No acute findings. Electronically Signed   By: Marin Olp M.D.   On: 09/19/2019 09:28  DG Hip Unilat  With Pelvis 2-3 Views Right  Result Date: 09/19/2019 CLINICAL DATA:  Fall. EXAM: DG HIP (WITH OR WITHOUT PELVIS) 2-3V RIGHT COMPARISON:  None. FINDINGS: Generalized osteopenia. No dislocation or visible hip fracture. Aortic iliac stenting. IMPRESSION: No evidence of fracture. Electronically Signed   By: Monte Fantasia M.D.   On: 09/19/2019 06:32        Scheduled Meds: . albuterol  2.5 mg Nebulization BID  . diclofenac Sodium  2 g Topical QID  . sodium chloride flush  3 mL Intravenous Q12H   Continuous Infusions:   LOS: 1 day    Time spent:40 min    Chad Buchanan, Geraldo Docker, MD [facility] Pager [phone]  If 7PM-7AM, please contact night-coverage [email or web address] Password [facility] 09/20/2019, 10:29 AM

## 2019-09-20 NOTE — Plan of Care (Signed)

## 2019-09-21 LAB — COMPREHENSIVE METABOLIC PANEL
ALT: 14 U/L (ref 0–44)
AST: 15 U/L (ref 15–41)
Albumin: 2.8 g/dL — ABNORMAL LOW (ref 3.5–5.0)
Alkaline Phosphatase: 46 U/L (ref 38–126)
Anion gap: 6 (ref 5–15)
BUN: 37 mg/dL — ABNORMAL HIGH (ref 8–23)
CO2: 26 mmol/L (ref 22–32)
Calcium: 8.6 mg/dL — ABNORMAL LOW (ref 8.9–10.3)
Chloride: 106 mmol/L (ref 98–111)
Creatinine, Ser: 1.59 mg/dL — ABNORMAL HIGH (ref 0.61–1.24)
GFR calc Af Amer: 42 mL/min — ABNORMAL LOW (ref >60)
GFR calc non Af Amer: 36 mL/min — ABNORMAL LOW (ref >60)
Glucose, Bld: 100 mg/dL — ABNORMAL HIGH (ref 70–99)
Potassium: 4.4 mmol/L (ref 3.5–5.1)
Sodium: 138 mmol/L (ref 135–145)
Total Bilirubin: 1.1 mg/dL (ref 0.3–1.2)
Total Protein: 5.3 g/dL — ABNORMAL LOW (ref 6.5–8.1)

## 2019-09-21 LAB — CBC
HCT: 26.2 % — ABNORMAL LOW (ref 39.0–52.0)
Hemoglobin: 7.8 g/dL — ABNORMAL LOW (ref 13.0–17.0)
MCH: 28.3 pg (ref 26.0–34.0)
MCHC: 29.8 g/dL — ABNORMAL LOW (ref 30.0–36.0)
MCV: 94.9 fL (ref 80.0–100.0)
Platelets: 100 10*3/uL — ABNORMAL LOW (ref 150–400)
RBC: 2.76 MIL/uL — ABNORMAL LOW (ref 4.22–5.81)
RDW: 14.9 % (ref 11.5–15.5)
WBC: 5.9 10*3/uL (ref 4.0–10.5)
nRBC: 0 % (ref 0.0–0.2)

## 2019-09-21 LAB — PROTIME-INR
INR: 2.8 — ABNORMAL HIGH (ref 0.8–1.2)
Prothrombin Time: 28.4 seconds — ABNORMAL HIGH (ref 11.4–15.2)

## 2019-09-21 LAB — MAGNESIUM: Magnesium: 2.3 mg/dL (ref 1.7–2.4)

## 2019-09-21 LAB — PHOSPHORUS: Phosphorus: 3 mg/dL (ref 2.5–4.6)

## 2019-09-21 MED ORDER — FOLIC ACID 5 MG/ML IJ SOLN
1.0000 mg | Freq: Every day | INTRAMUSCULAR | Status: DC
  Administered 2019-09-21: 1 mg via INTRAVENOUS
  Filled 2019-09-21 (×2): qty 0.2

## 2019-09-21 MED ORDER — POLYETHYLENE GLYCOL 3350 17 G PO PACK
17.0000 g | PACK | Freq: Every day | ORAL | Status: DC
  Administered 2019-09-21 – 2019-09-22 (×2): 17 g via ORAL
  Filled 2019-09-21 (×5): qty 1

## 2019-09-21 MED ORDER — TAMSULOSIN HCL 0.4 MG PO CAPS
0.4000 mg | ORAL_CAPSULE | Freq: Every day | ORAL | Status: DC
  Administered 2019-09-21 – 2019-09-25 (×5): 0.4 mg via ORAL
  Filled 2019-09-21 (×5): qty 1

## 2019-09-21 MED ORDER — FENTANYL CITRATE (PF) 100 MCG/2ML IJ SOLN: 12.5000 ug | INTRAMUSCULAR | Status: DC | PRN

## 2019-09-21 NOTE — NC FL2 (Addendum)
[place] LEVEL OF CARE SCREENING TOOL     IDENTIFICATION  Patient Name: Chad Buchanan. Birthdate: [birth year] Sex: male Admission Date (Current Location): 09/19/2019  [facility] and [place] Number:  Herbalist and Address:  [facility] [place], [place] [address] [place], [place] [address]      Provider Number: [id number]  Attending Physician Name and Address:  Allie Bossier, MD  Relative Name and Phone Number:  Juliann Pulse (daughter) [phone]    Current Level of Care: Hospital Recommended Level of Care: [place] Prior Approval Number:    Date Approved/Denied:   PASRR Number:   [id number]  Discharge Plan: SNF    Current Diagnoses: Patient Active Problem List   Diagnosis Date Noted  . AKI (acute kidney injury) ([place]) 09/20/2019  . Chronic atrial fibrillation ([place]) 09/20/2019  . Acute respiratory failure ([place] ) 09/20/2019  . Internal bleeding 09/20/2019  . DNR (do not resuscitate) 09/20/2019  . Left acetabular fracture ([place]) 09/20/2019  . Pubic ramus fracture ([place]) 09/20/2019  . Fall 09/19/2019    Orientation RESPIRATION BLADDER Height & Weight     Self, Time, Situation, Place  O2(2L Nasal Cannula) Incontinent, External catheter Weight: 179 lb 10.8 oz (81.5 kg) Height:  6' (182.9 cm)  BEHAVIORAL SYMPTOMS/MOOD NEUROLOGICAL BOWEL NUTRITION STATUS      Continent Diet(see discharge summary)  AMBULATORY STATUS COMMUNICATION OF NEEDS Skin   Extensive Assist Verbally Other (Comment)(ecchymosis arms, skin tear elbow)                       Personal Care Assistance Level of Assistance  Bathing, Dressing, Total care, Feeding Bathing Assistance: Limited assistance Feeding assistance: Independent Dressing Assistance: Limited assistance Total Care Assistance: Maximum assistance   Functional Limitations Info  Sight, Hearing, Speech Sight Info: Adequate Hearing Info: Impaired Speech Info: Adequate    SPECIAL  CARE FACTORS FREQUENCY  PT (By licensed PT), OT (By licensed OT)     PT Frequency: min 5x weekly OT Frequency: min 5x weekly            Contractures Contractures Info: Not present    Additional Factors Info  Code Status, Allergies Code Status Info: DNR Allergies Info: Oxycodone, Tramadol           Current Medications (09/21/2019):  This is the current hospital active medication list Current Facility-Administered Medications  Medication Dose Route Frequency Provider Last Rate Last Admin  . acetaminophen (TYLENOL) tablet 650 mg  650 mg Oral Q6H PRN Norval Morton, MD       Or  . acetaminophen (TYLENOL) suppository 650 mg  650 mg Rectal Q6H PRN Tamala Julian, Rondell A, MD      . albuterol (PROVENTIL) (2.5 MG/3ML) 0.083% nebulizer solution 2.5 mg  2.5 mg Nebulization Q4H PRN Allie Bossier, MD      . diclofenac Sodium (VOLTAREN) 1 % topical gel 2 g  2 g Topical QID Tamala Julian, Rondell A, MD   2 g at 09/21/19 1013  . fentaNYL (SUBLIMAZE) injection 25 mcg  25 mcg Intravenous Q2H PRN Fuller Plan A, MD   25 mcg at 09/20/19 1001  . folic acid injection 1 mg  1 mg Intravenous Daily Allie Bossier, MD   1 mg at 09/21/19 0943  . methocarbamol (ROBAXIN) tablet 500 mg  500 mg Oral Q8H PRN Fuller Plan A, MD   500 mg at 09/21/19 0753  . oxyCODONE (OXYCONTIN) 12 hr tablet 10 mg  10 mg Oral Q12H Allie Bossier, MD   10 mg at 09/21/19 0847  . sodium chloride flush (NS) 0.9 % injection 3 mL  3 mL Intravenous Q12H Smith, Rondell A, MD   3 mL at 09/21/19 0848  . vitamin B-12 (CYANOCOBALAMIN) tablet 50 mcg  50 mcg Oral Daily Allie Bossier, MD   50 mcg at 09/21/19 [id number]     Discharge Medications: Please see discharge summary for a list of discharge medications.  Relevant Imaging Results:  Relevant Lab Results:   Additional Information SSN: [id number]  Alberteen Sam, LCSW

## 2019-09-21 NOTE — Progress Notes (Signed)
Bladder scan with approx 60ml.

## 2019-09-21 NOTE — Progress Notes (Signed)
PROGRESS NOTE    Chad Buchanan.  [id number] DOB: 02-Dec-1920 DOA: 09/19/2019 PCP: Crist Infante, MD     Brief Narrative:  86 y.o. male PMHx HTN, paroxysmal atrial fibrillation on Coumadin, chronic diastolic CHF last EF 50 to 55% in 2016, aortic aneurysm, and BPH   Presents after having a fall at home around 1 AM.  Patient reports that he is not exactly sure of how he fell, but thinks that his foot slipped causing him to fall landed on his left side in the kitchen.  He does not recall losing consciousness or hitting his head.  He had to call out to his grandson to help him get up and he was able to make it to his bedroom.  He complained of pain in his left hip and shoulder and was unable to get back up.  Pain was worsened with any kind of movement.  Associated symptoms include muscle spasms and shortness of breath due to.  At baseline he does not report being on home oxygen.  He is on blood thinners of Coumadin.  ED Course: Upon admission into the emergency department patient was noted to be afebrile with  O2 saturations noted as low as 83% with improvement on 2 L of nasal cannula oxygen.  CT scan of the brain showed no acute abnormalities.  Labs significant for hemoglobin 9.6, platelets 114, BUN 38, creatinine 1.73, glucose 140, and BNP 153.8.  Chest x-ray noted chronic diffuse interstitial lung disease unchanged.  CT imaging revealed a nondisplaced fracture of the left obturator ring and acetabulum along with signs of extraperitoneal hemorrhage.  COVID-19 and influenza screening were negative.  Orthopedics was consulted, but recommended no surgery..  Patient had been given fentanyl and albuterol inhaler.  TRH called to admit.  Subjective: 4/29 extremely hard of hearing.  A/O x4, negative S OB, negative CP, negative abdominal pain.  Daughter present at bedside states patient very unsteady prior to accident would only ambulate with a walker from chair to bathroom or from chair to get something  to eat.  Chronic back pain (stenosis)   Assessment & Plan:   Principal Problem:   Left acetabular fracture ([place]) Active Problems:   Fall   AKI (acute kidney injury) ([place])   Chronic atrial fibrillation (H[facility])   Acute respiratory failure ([place])   Internal bleeding   DNR (do not resuscitate)   Pubic ramus fracture (HCC)   Acute LEFT Acetabular and left ramus fracture Secondary to fall:  -Orthopedics recommends [facility] LLE. F/u with Dr. Marcelino Scot in office in 2-3 weeks. -PT/OT recommend SNF.  Have spoken with family awaiting paperwork to go through. -Out of bed to chair q shift  Pain control -4/29 decrease Fentanyl 12.5 mcg PRN.  Preparation for patient going to SNF -Robaxin 500 mg TID PRN -OxyContin 10 mg BID  Acute respiratory failure with hypoxia -Titrate O2 to maintain SPO2> 92% - Incentive spirometry  Extraperitoneal hemorrhage  Possible intracystic hemorrhage  Macrocytic anemia -Anemia panel consistent with macrocytic anemia, low B12, low folate -B12 1000 mcg IM.---> B12 p.o. 50 mcg daily -Folate IV 1 mg daily Recent Labs  Lab 09/19/19 0606 09/20/19 0553 09/21/19 0340  HGB 9.6* 8.2* 7.8*  -Hemoglobin stable, continue to follow closely  Acute kidney injury (baseline Cr 1.0)/Acute Urine Retention Recent Labs  Lab 09/19/19 0606 09/20/19 0553 09/21/19 0340  CREATININE 1.73* 1.56* 1.59*  -Per EMR patient having episodes of urine retention, bladder scan  QID -Orders placed for in and out cath -  If patient continues to have episodes of Urine Retention Place, Foley catheter -Urinalysis nondiagnostic for cause of urinary retention -If urine retention continues obtain kidney US -Strict in and out -726.59ml -Daily weight -4/29 multiple bladder scans have shown negative retention. -Flomax 0.4 mg daily  Chronic atrial fibrillation -On chronic Coumadin (held) secondary to bleed Recent Labs  Lab 09/19/19 0606 09/20/19 0553 09/21/19 0340  INR 2.5* 3.3* 2.8*   -Therapeutic -4/29 spoke at length with daughter and wife and given how unsteady patient was prior to injury agreed that should not restart full strength anticoagulation. -4/29 ASA 324 mg daily when INR begins to normalize.  Thrombocytopenia (acute on chronic).  Baseline 114 -4/29 continuing to slowly trend down.  DIC secondary to trauma? -Currently no signs of overt bleeding  Dysphagia -Family request that we liberalize diet to regular soft diet.  Given patient's age in comorbidities will comply.  Goals of care -4/28 PT/OT; evaluate for CIR vs SNF   DVT prophylaxis: SCD Code Status: DNR Family Communication:  Disposition Plan:  1.  Where the patient is from 2.  Anticipated d/c place. 3.  Barriers to d/c    Consultants:  Orthopedic surgery   Procedures/Significant Events:  4/27 CT pelvis Wo contrast;Nondisplaced fracture through the left obturator ring and acetabulum. 2. Mild extraperitoneal hemorrhage in the left pelvis. 3. 6 cm partially covered high-density mass at the right flank where there was a renal cyst seen in 2012. This presumably reflects intracystic hemorrhage, but is incompletely visualized and solid mass is not excluded. Enhanced CT or renal ultrasound could be attempted if appropriate for comorbidities. 4/27 CT head W0 contrast; negative acute finding    I have personally reviewed and interpreted all radiology studies and my findings are as above.  VENTILATOR SETTINGS:    Cultures   Antimicrobials:    Devices    LINES / TUBES:      Continuous Infusions:   Objective: Vitals:   09/21/19 0134 09/21/19 0443 09/21/19 0753 09/21/19 1133  BP:  102/67 100/73 95/67  Pulse:  75 62 83  Resp:  18 20 20   Temp:  97.9 F (36.6 C) 99.1 F (37.3 C) 98 F (36.7 C)  TempSrc:  Oral Oral Oral  SpO2:  91% 95% 90%  Weight: 81.5 kg     Height:        Intake/Output Summary (Last 24 hours) at 09/21/2019 1410 Last data filed at 09/21/2019  1258 Gross per 24 hour  Intake 650.2 ml  Output 827 ml  Net -176.8 ml   Filed Weights   09/19/19 0559 09/20/19 0442 09/21/19 0134  Weight: 75.8 kg 81.1 kg 81.5 kg    Examination:  General: A/O x4, positive acute respiratory distress Eyes: negative scleral hemorrhage, negative anisocoria, negative icterus ENT: Negative Runny nose, negative gingival bleeding, HARD OF HEARING Neck:  Negative scars, masses, torticollis, lymphadenopathy, JVD Lungs: Clear to auscultation bilaterally without wheezes or crackles Cardiovascular: Regular rate and rhythm without murmur gallop or rub normal S1 and S2 Abdomen: negative abdominal pain, nondistended, positive soft, bowel sounds, no rebound, no ascites, no appreciable mass Extremities: No significant cyanosis, clubbing, or edema bilateral lower extremities Skin: Negative rashes, lesions, ulcers Psychiatric:  Negative depression, negative anxiety, negative fatigue, negative mania  Central nervous system:  Cranial nerves II through XII intact, tongue/uvula midline, all extremities muscle strength 5/5, sensation intact throughout, negative dysarthria, negative expressive aphasia, negative receptive aphasia.  .     Data Reviewed: Care during the described time interval  was provided by me .  I have reviewed this patient's available data, including medical history, events of note, physical examination, and all test results as part of my evaluation.  CBC: Recent Labs  Lab 09/19/19 0606 09/20/19 0553 09/21/19 0340  WBC 8.2 5.8 5.9  NEUTROABS 7.1  --   --   HGB 9.6* 8.2* 7.8*  HCT 32.1* 26.8* 26.2*  MCV 95.8 94.7 94.9  PLT 114* 109* 123XX123*   Basic Metabolic Panel: Recent Labs  Lab 09/19/19 0606 09/20/19 0553 09/21/19 0340  NA 142 138 138  K 4.5 4.6 4.4  CL 107 106 106  CO2 27 26 26   GLUCOSE 140* 101* 100*  BUN 38* 34* 37*  CREATININE 1.73* 1.56* 1.59*  CALCIUM 8.9 8.7* 8.6*  MG  --   --  2.3  PHOS  --   --  3.0   GFR: Estimated  Creatinine Clearance: 29.8 mL/min (A) (by C-G formula based on SCr of 1.59 mg/dL (H)). Liver Function Tests: Recent Labs  Lab 09/21/19 0340  AST 15  ALT 14  ALKPHOS 46  BILITOT 1.1  PROT 5.3*  ALBUMIN 2.8*   No results for input(s): LIPASE, AMYLASE in the last 168 hours. No results for input(s): AMMONIA in the last 168 hours. Coagulation Profile: Recent Labs  Lab 09/19/19 0606 09/20/19 0553 09/21/19 0340  INR 2.5* 3.3* 2.8*   Cardiac Enzymes: No results for input(s): CKTOTAL, CKMB, CKMBINDEX, TROPONINI in the last 168 hours. BNP (last 3 results) No results for input(s): PROBNP in the last 8760 hours. HbA1C: No results for input(s): HGBA1C in the last 72 hours. CBG: No results for input(s): GLUCAP in the last 168 hours. Lipid Profile: No results for input(s): CHOL, HDL, LDLCALC, TRIG, CHOLHDL, LDLDIRECT in the last 72 hours. Thyroid Function Tests: No results for input(s): TSH, T4TOTAL, FREET4, T3FREE, THYROIDAB in the last 72 hours. Anemia Panel: Recent Labs    09/20/19 1152  VITAMINB12 174*  FOLATE 5.6*  FERRITIN 130  TIBC 207*  IRON 21*  RETICCTPCT 1.5   Sepsis Labs: No results for input(s): PROCALCITON, LATICACIDVEN in the last 168 hours.  Recent Results (from the past 240 hour(s))  Respiratory Panel by RT PCR (Flu A&B, Covid) - Nasopharyngeal Swab     Status: None   Collection Time: 09/19/19 11:35 AM   Specimen: Nasopharyngeal Swab  Result Value Ref Range Status   SARS Coronavirus 2 by RT PCR NEGATIVE NEGATIVE Final    Comment: (NOTE) SARS-CoV-2 target nucleic acids are NOT DETECTED. The SARS-CoV-2 RNA is generally detectable in upper respiratoy specimens during the acute phase of infection. The lowest concentration of SARS-CoV-2 viral copies this assay can detect is 131 copies/mL. A negative result does not preclude SARS-Cov-2 infection and should not be used as the sole basis for treatment or other patient management decisions. A negative result may  occur with  improper specimen collection/handling, submission of specimen other than nasopharyngeal swab, presence of viral mutation(s) within the areas targeted by this assay, and inadequate number of viral copies (<131 copies/mL). A negative result must be combined with clinical observations, patient history, and epidemiological information. The expected result is Negative. Fact Sheet for Patients:  PinkCheek.be Fact Sheet for Healthcare Providers:  GravelBags.it This test is not yet ap proved or cleared by the [place] FDA and  has been authorized for detection and/or diagnosis of SARS-CoV-2 by FDA under an Emergency Use Authorization (EUA). This EUA will remain  in effect (meaning this test can be  used) for the duration of the COVID-19 declaration under Section 564(b)(1) of the Act, 21 U.S.C. section 360bbb-3(b)(1), unless the authorization is terminated or revoked sooner.    Influenza A by PCR NEGATIVE NEGATIVE Final   Influenza B by PCR NEGATIVE NEGATIVE Final    Comment: (NOTE) The Xpert Xpress SARS-CoV-2/FLU/RSV assay is intended as an aid in  the diagnosis of influenza from Nasopharyngeal swab specimens and  should not be used as a sole basis for treatment. Nasal washings and  aspirates are unacceptable for Xpert Xpress SARS-CoV-2/FLU/RSV  testing. Fact Sheet for Patients: PinkCheek.be Fact Sheet for Healthcare Providers: GravelBags.it This test is not yet approved or cleared by the [place] FDA and  has been authorized for detection and/or diagnosis of SARS-CoV-2 by  FDA under an Emergency Use Authorization (EUA). This EUA will remain  in effect (meaning this test can be used) for the duration of the  Covid-19 declaration under Section 564(b)(1) of the Act, 21  U.S.C. section 360bbb-3(b)(1), unless the authorization is  terminated or  revoked. Performed at [facility], [place] [address] [place], [place] [address]          Radiology Studies: No results found.      Scheduled Meds: . diclofenac Sodium  2 g Topical QID  . folic acid  1 mg Intravenous Daily  . oxyCODONE  10 mg Oral Q12H  . sodium chloride flush  3 mL Intravenous Q12H  . vitamin B-12  50 mcg Oral Daily   Continuous Infusions:   LOS: 2 days    Time spent:40 min    Jaking Thayer, Geraldo Docker, MD [facility] Pager [phone]  If 7PM-7AM, please contact night-coverage [email or web address] Password [email or web address] 09/21/2019, 2:10 PM

## 2019-09-21 NOTE — TOC Initial Note (Signed)
Transition of Care (TOC) - Initial/Assessment Note    Patient Details  Name: Chad Buchanan. MRN: [id number] Date of Birth: 01-31-1921  [facility]) CM/SW Contact:    Alberteen Sam, LCSW Phone Number: 09/21/2019, 11:58 AM  Clinical Narrative:                  CSW met with patient at bedside, patient very hard of hearing. CSW communicated with patient by writing down on paper, patient agreeable to SNF at this time and gave CSW permission to contact his daughter Juliann Pulse.   CSW called Juliann Pulse who reports she will be at the hospital soon, states she will be point of contact for patient and is also in agreement with SNF at time of discharge. No SNF preference at this time, Juliann Pulse agreeable for CSW to fax out referrals for bed offers.   CSW has faxed out referrals, pending SNF bed offers at this time.   Expected Discharge Plan: Skilled Nursing Facility Barriers to Discharge: Continued Medical Work up   Patient Goals and CMS Choice Patient states their goals for this hospitalization and ongoing recovery are:: to go to rehab then go home CMS Medicare.gov Compare Post Acute Care list provided to:: Patient Choice offered to / list presented to : Patient  Expected Discharge Plan and Services Expected Discharge Plan: [place] Choice: [place] arrangements for the past 2 months: Single Family Home                                      Prior Living Arrangements/Services Living arrangements for the past 2 months: Single Family Home Lives with:: Self Patient language and need for interpreter reviewed:: Yes Do you feel safe going back to the place where you live?: No   needs short term rehab  Need for Family Participation in Patient Care: Yes (Comment) Care giver support system in place?: Yes (comment)   Criminal Activity/Legal Involvement Pertinent to Current Situation/Hospitalization: No - Comment as  needed  Activities of Daily Living Home Assistive Devices/Equipment: Walker (specify type) ADL Screening (condition at time of admission) Patient's cognitive ability adequate to safely complete daily activities?: Yes Is the patient deaf or have difficulty hearing?: Yes Does the patient have difficulty seeing, even when wearing glasses/contacts?: No Does the patient have difficulty concentrating, remembering, or making decisions?: No Patient able to express need for assistance with ADLs?: Yes Does the patient have difficulty dressing or bathing?: Yes Independently performs ADLs?: No Communication: Independent Dressing (OT): Dependent Is this a change from baseline?: Change from baseline, expected to last >3 days Grooming: Needs assistance Is this a change from baseline?: Change from baseline, expected to last >3 days Feeding: Independent Bathing: Dependent Is this a change from baseline?: Change from baseline, expected to last >3 days Toileting: Dependent Is this a change from baseline?: Change from baseline, expected to last >3days In/Out Bed: Dependent Is this a change from baseline?: Change from baseline, expected to last >3 days Walks in Home: Dependent Is this a change from baseline?: Change from baseline, expected to last >3 days Does the patient have difficulty walking or climbing stairs?: Yes Weakness of Legs: Left Weakness of Arms/Hands: None  Permission Sought/Granted Permission sought to share information with : Facility Sport and exercise psychologist, Case Manager, Family Supports Permission granted to share information with : Yes, Verbal Permission  Granted  Share Information with NAME: Sahid Borba  Permission granted to share info w AGENCY: SNFs  Permission granted to share info w Relationship: daughter  Permission granted to share info w Contact Information: [phone]  Emotional Assessment Appearance:: Appears stated age Attitude/Demeanor/Rapport: Gracious Affect  (typically observed): Calm Orientation: : Oriented to Self, Oriented to Place, Oriented to  Time, Oriented to Situation Alcohol / Substance Use: Not Applicable Psych Involvement: No (comment)  Admission diagnosis:  Renal mass [N28.89] Fall [W19.XXXA] Chronic obstructive pulmonary disease, unspecified COPD type ([place]) [J44.9] Closed nondisplaced fracture of pelvis, unspecified part of pelvis, initial encounter ([place]) [S32.9XXA] Patient Active Problem List   Diagnosis Date Noted  . AKI (acute kidney injury) ([place]) 09/20/2019  . Chronic atrial fibrillation ([place]) 09/20/2019  . Acute respiratory failure ([place]) 09/20/2019  . Internal bleeding 09/20/2019  . DNR (do not resuscitate) 09/20/2019  . Left acetabular fracture ([place]) 09/20/2019  . Pubic ramus fracture ([place]) 09/20/2019  . Fall 09/19/2019   PCP:  Crist Infante, MD Pharmacy:   [facility], [place] [address] [place] [address] Phone: [phone] Fax: [phone]     Social Determinants of Health (SDOH) Interventions    Readmission Risk Interventions No flowsheet data found.

## 2019-09-21 NOTE — Progress Notes (Signed)
Bladder scan with approx 158ml.

## 2019-09-22 ENCOUNTER — Inpatient Hospital Stay (HOSPITAL_COMMUNITY): Payer: Medicare HMO

## 2019-09-22 LAB — COMPREHENSIVE METABOLIC PANEL
ALT: 13 U/L (ref 0–44)
AST: 17 U/L (ref 15–41)
Albumin: 2.7 g/dL — ABNORMAL LOW (ref 3.5–5.0)
Alkaline Phosphatase: 45 U/L (ref 38–126)
Anion gap: 6 (ref 5–15)
BUN: 42 mg/dL — ABNORMAL HIGH (ref 8–23)
CO2: 26 mmol/L (ref 22–32)
Calcium: 8.4 mg/dL — ABNORMAL LOW (ref 8.9–10.3)
Chloride: 103 mmol/L (ref 98–111)
Creatinine, Ser: 1.74 mg/dL — ABNORMAL HIGH (ref 0.61–1.24)
GFR calc Af Amer: 38 mL/min — ABNORMAL LOW (ref >60)
GFR calc non Af Amer: 32 mL/min — ABNORMAL LOW (ref >60)
Glucose, Bld: 95 mg/dL (ref 70–99)
Potassium: 4.6 mmol/L (ref 3.5–5.1)
Sodium: 135 mmol/L (ref 135–145)
Total Bilirubin: 0.9 mg/dL (ref 0.3–1.2)
Total Protein: 5.2 g/dL — ABNORMAL LOW (ref 6.5–8.1)

## 2019-09-22 LAB — PHOSPHORUS: Phosphorus: 3.1 mg/dL (ref 2.5–4.6)

## 2019-09-22 LAB — PROTIME-INR
INR: 2.5 — ABNORMAL HIGH (ref 0.8–1.2)
Prothrombin Time: 26.2 seconds — ABNORMAL HIGH (ref 11.4–15.2)

## 2019-09-22 LAB — CBC
HCT: 25.4 % — ABNORMAL LOW (ref 39.0–52.0)
Hemoglobin: 7.8 g/dL — ABNORMAL LOW (ref 13.0–17.0)
MCH: 29.1 pg (ref 26.0–34.0)
MCHC: 30.7 g/dL (ref 30.0–36.0)
MCV: 94.8 fL (ref 80.0–100.0)
Platelets: 105 10*3/uL — ABNORMAL LOW (ref 150–400)
RBC: 2.68 MIL/uL — ABNORMAL LOW (ref 4.22–5.81)
RDW: 14.7 % (ref 11.5–15.5)
WBC: 5 10*3/uL (ref 4.0–10.5)
nRBC: 0 % (ref 0.0–0.2)

## 2019-09-22 LAB — MAGNESIUM: Magnesium: 2.3 mg/dL (ref 1.7–2.4)

## 2019-09-22 MED ORDER — TRAMADOL HCL 50 MG PO TABS
50.0000 mg | ORAL_TABLET | Freq: Two times a day (BID) | ORAL | Status: DC
  Administered 2019-09-22 – 2019-09-24 (×3): 50 mg via ORAL
  Filled 2019-09-22 (×6): qty 1

## 2019-09-22 MED ORDER — TRAMADOL HCL 50 MG PO TABS: 50.0000 mg | ORAL_TABLET | Freq: Three times a day (TID) | ORAL | Status: DC

## 2019-09-22 MED ORDER — FOLIC ACID 1 MG PO TABS
1.0000 mg | ORAL_TABLET | Freq: Every day | ORAL | Status: DC
  Administered 2019-09-22 – 2019-09-25 (×4): 1 mg via ORAL
  Filled 2019-09-22 (×4): qty 1

## 2019-09-22 NOTE — Progress Notes (Signed)
Physical Therapy Treatment Patient Details Name: Chad Buchanan. MRN: [id number] DOB: [birth year] Today's Date: 09/22/2019    History of Present Illness Pt is a 86 y/o male admitted following fall. Found to have L acetabular and obturator fx. Also with mild extraperitoneal hemorrhage in L pelvis. PMH includes a fib, CHF, COPD, and AAA.     PT Comments    Pt supine in bed with daughter in-law in room on entry. Pt is eager to get out of bed with therapy today, despite L hip pain. Pt requires maxAx2 for bed mobility and modAx2 for lateral scoot transfer from bed to drop arm recliner. Once in chair pt agreeable to LE therapeutic exercise. Pt instructed to stay up in recliner for about 2 hours to have enough strength to get back to bed. D/c plan remains appropriate. PT will continue to follow acutely.    Follow Up Recommendations  SNF;Supervision/Assistance - 24 hour     Equipment Recommendations  Wheelchair (measurements PT);Wheelchair cushion (measurements PT)       Precautions / Restrictions Precautions Precautions: Fall Precaution Comments: Pt with fall at home.  Restrictions Weight Bearing Restrictions: Yes LLE Weight Bearing: Touchdown weight bearing    Mobility  Bed Mobility Overal bed mobility: Needs Assistance Bed Mobility: Supine to Sit     Supine to sit: Max assist;+2 for physical assistance     General bed mobility comments: Max A  +2 for trunk and LE assist. Increased time required secondary to pain.   Transfers Overall transfer level: Needs assistance   Transfers: Lateral/Scoot Transfers          Lateral/Scoot Transfers: Mod assist;+2 physical assistance General transfer comment: cues for technique and correct hand placement for lateral scoot to drop arm recliner  Ambulation/Gait             General Gait Details: unable due to weakness and weightbearing restrictions       Balance Overall balance assessment: Needs assistance Sitting-balance  support: No upper extremity supported;Feet supported Sitting balance-[name]: Fair Sitting balance - Comments: flexed posture                                    Cognition Arousal/Alertness: Awake/alert Behavior During Therapy: WFL for tasks assessed/performed Overall Cognitive Status: Within Functional Limits for tasks assessed                                 General Comments: Very [name]      Exercises General Exercises - Lower Extremity Ankle Circles/Pumps: AROM;Both;10 reps;Seated Long Arc Quad: AROM;Left;10 reps;Seated    General Comments General comments (skin integrity, edema, etc.): daughter in-law in room, retired Marine scientist provides PLOF information       Pertinent Vitals/Pain Pain Assessment: Faces Faces Pain Scale: Hurts even more Pain Location: L hip and L arm Pain Descriptors / Indicators: Grimacing;Guarding Pain Intervention(s): Limited activity within patient's tolerance;Monitored during session;Repositioned           PT Goals (current goals can now be found in the care plan section) Acute Rehab PT Goals PT Goal Formulation: With patient Time For Goal Achievement: 10/04/19 Potential to Achieve Goals: Good Progress towards PT goals: Progressing toward goals    Frequency    Min 2X/week      PT Plan Current plan remains appropriate    Co-evaluation   Reason for  Co-Treatment: Complexity of the patient's impairments (multi-system involvement);To address functional/ADL transfers   OT goals addressed during session: ADL's and self-care;Proper use of Adaptive equipment and DME      AM-PAC PT "6 Clicks" Mobility   Outcome Measure  Help needed turning from your back to your side while in a flat bed without using bedrails?: Total Help needed moving from lying on your back to sitting on the side of a flat bed without using bedrails?: Total Help needed moving to and from a bed to a chair (including a wheelchair)?: Total Help  needed standing up from a chair using your arms (e.g., wheelchair or bedside chair)?: Total Help needed to walk in hospital room?: Total Help needed climbing 3-5 steps with a railing? : Total 6 Click Score: 6    End of Session Equipment Utilized During Treatment: Gait belt Activity Tolerance: Patient tolerated treatment well Patient left: with call bell/phone within reach;in chair;with chair alarm set;with family/visitor present Nurse Communication: Mobility status PT Visit Diagnosis: Muscle weakness (generalized) (M62.81);History of falling (Z91.81);Difficulty in walking, not elsewhere classified (R26.2);Pain Pain - Right/Left: Left Pain - part of body: Hip;Arm     Time: [id number] PT Time Calculation (min) (ACUTE ONLY): 43 min  Charges:  $Therapeutic Activity: 8-22 mins                     Kissie Ziolkowski B. Migdalia Dk PT, DPT Acute Rehabilitation Services Pager [phone] Office [phone]    [place] 09/22/2019, 3:59 PM

## 2019-09-22 NOTE — TOC Progression Note (Addendum)
Transition of Care (TOC) - Progression Note    Patient Details  Name: Chad Buchanan. MRN: [id number] Date of Birth: 02-10-1921  Transition of Care [facility]) CM/SW [place], [place] Phone Number: 09/22/2019, 12:08 PM  Clinical Narrative:     Update: CSW provided update to Joann of [facility] bing 409/day, they decline at thsi time and have decided on [facility]. CSW has reached out to [place] with admission at [facility] and requested they start [facility] authorization today.  CSW provided bed offers to [place] at bedside and explained insurance authorization needed after SNF choice is made. Arville Go reports she will review list and let CSW know of choice. She requests CSW fax out to [facility] to see if they can private pay for SNF, CSW has called and lvm with [facility] admissions pending call back at this time.   Expected Discharge Plan: [place] Barriers to Discharge: Continued Medical Work up  Expected Discharge Plan and Services Expected Discharge Plan: [place] Choice: [place] arrangements for the past 2 months: Single Family Home                                       Social Determinants of Health (SDOH) Interventions    Readmission Risk Interventions No flowsheet data found.

## 2019-09-22 NOTE — Progress Notes (Signed)
Occupational Therapy Treatment Patient Details Name: Chad Buchanan. MRN: [id number] DOB: 1921-04-02 Today's Date: 09/22/2019    History of present illness Pt is a 86 y/o male admitted following fall. Found to have L acetabular and obturator fx. Also with mild extraperitoneal hemorrhage in L pelvis. PMH includes a fib, CHF, COPD, and AAA.    OT comments  Pt making consistent progress with functional goals. Session focused on bed mobility to sit EOB max A + 2, lateral scoot transfers to drop arm recliner mod A +2, simulated bathing tasks seated in recliner. Pt limited by L UE AROM due to pain and edema. Pt's daughter present and states that pt has been [facility]. OT will continue to follow acutely  Follow Up Recommendations  SNF;Supervision/Assistance - 24 hour    Equipment Recommendations  Other (comment)(TBD at SNF)    Recommendations for Other Services      Precautions / Restrictions Precautions Precautions: Fall Precaution Comments: Pt with fall at home.  Restrictions Weight Bearing Restrictions: Yes LLE Weight Bearing: Weight bearing as tolerated       Mobility Bed Mobility Overal bed mobility: Needs Assistance Bed Mobility: Supine to Sit     Supine to sit: Max assist;+2 for physical assistance     General bed mobility comments: Max A  +2 for trunk and LE assist. Increased time required secondary to pain.   Transfers Overall transfer level: Needs assistance   Transfers: Lateral/Scoot Transfers          Lateral/Scoot Transfers: Mod assist;+2 physical assistance General transfer comment: cues for technique and correct hand placement for lateral scoot to drop arm recliner    Balance Overall balance assessment: Needs assistance Sitting-balance support: No upper extremity supported;Feet supported Sitting balance-[name]: Fair Sitting balance - Comments: flexed posture                                   ADL either performed  or assessed with clinical judgement   ADL Overall ADL's : Needs assistance/impaired     Grooming: Wash/dry face;Wash/dry hands;Min guard;Sitting   Upper Body Bathing: Minimal assistance;Sitting Upper Body Bathing Details (indicate cue type and reason): simulated Lower Body Bathing: Maximal assistance;Sitting/lateral leans   Upper Body Dressing : Minimal assistance;Sitting Upper Body Dressing Details (indicate cue type and reason): simulated                   General ADL Comments: Pt limited by pain in LUE, decreased strength, decreased ability to care for self and decreased activity tolerance     Vision Patient Visual Report: No change from baseline     Perception     Praxis      Cognition Arousal/Alertness: Awake/alert Behavior During Therapy: WFL for tasks assessed/performed Overall Cognitive Status: Within Functional Limits for tasks assessed                                 General Comments: Very HOH        Exercises     Shoulder Instructions       General Comments      Pertinent Vitals/ Pain       Pain Assessment: Faces Faces Pain Scale: Hurts even more Pain Location: L hip and L arm Pain Descriptors / Indicators: Grimacing;Guarding Pain Intervention(s): Limited activity within patient's tolerance;Monitored during session;Premedicated before  session;Repositioned  Home Living                                          Prior Functioning/Environment              Frequency  Min 2X/week        Progress Toward Goals  OT Goals(current goals can now be found in the care plan section)  Progress towards OT goals: Progressing toward goals     Plan Discharge plan remains appropriate    Co-evaluation    PT/OT/SLP Co-Evaluation/Treatment: Yes Reason for Co-Treatment: Complexity of the patient's impairments (multi-system involvement);To address functional/ADL transfers   OT goals addressed during session: ADL's  and self-care;Proper use of Adaptive equipment and DME      AM-PAC OT "6 Clicks" Daily Activity     Outcome Measure   Help from another person eating meals?: A Little Help from another person taking care of personal grooming?: A Little Help from another person toileting, which includes using toliet, bedpan, or urinal?: Total Help from another person bathing (including washing, rinsing, drying)?: Total Help from another person to put on and taking off regular upper body clothing?: A Little Help from another person to put on and taking off regular lower body clothing?: Total 6 Click Score: 12    End of Session Equipment Utilized During Treatment: Gait belt  OT Visit Diagnosis: Unsteadiness on feet (R26.81);Muscle weakness (generalized) (M62.81);Pain Pain - Right/Left: Left Pain - part of body: Hip;Shoulder   Activity Tolerance Patient tolerated treatment well   Patient Left with call bell/phone within reach;with family/visitor present;in chair;with chair alarm set   Nurse Communication          Time: [id number] OT Time Calculation (min): 43 min  Charges: OT General Charges $OT Visit: 1 Visit OT Treatments $Therapeutic Activity: 8-22 mins    Britt Bottom 09/22/2019, 1:20 PM

## 2019-09-22 NOTE — Progress Notes (Signed)
PROGRESS NOTE    Chad Buchanan.  [id number] DOB: July 04, 1920 DOA: 09/19/2019 PCP: Crist Infante, MD     Brief Narrative:  86 y.o. male PMHx HTN, paroxysmal atrial fibrillation on Coumadin, chronic diastolic CHF last EF 50 to 55% in 2016, aortic aneurysm, and BPH   Presents after having a fall at home around 1 AM.  Patient reports that he is not exactly sure of how he fell, but thinks that his foot slipped causing him to fall landed on his left side in the kitchen.  He does not recall losing consciousness or hitting his head.  He had to call out to his grandson to help him get up and he was able to make it to his bedroom.  He complained of pain in his left hip and shoulder and was unable to get back up.  Pain was worsened with any kind of movement.  Associated symptoms include muscle spasms and shortness of breath due to.  At baseline he does not report being on home oxygen.  He is on blood thinners of Coumadin.  ED Course: Upon admission into the emergency department patient was noted to be afebrile with  O2 saturations noted as low as 83% with improvement on 2 L of nasal cannula oxygen.  CT scan of the brain showed no acute abnormalities.  Labs significant for hemoglobin 9.6, platelets 114, BUN 38, creatinine 1.73, glucose 140, and BNP 153.8.  Chest x-ray noted chronic diffuse interstitial lung disease unchanged.  CT imaging revealed a nondisplaced fracture of the left obturator ring and acetabulum along with signs of extraperitoneal hemorrhage.  COVID-19 and influenza screening were negative.  Orthopedics was consulted, but recommended no surgery..  Patient had been given fentanyl and albuterol inhaler.  [facility] called to admit.  Subjective: 4/30 febrile overnight, patient with left shoulder pain consistent with possible rotator cuff tear when working with PT/OT today.   Assessment & Plan:   Principal Problem:   Left acetabular fracture ([place]) Active Problems:   Fall   AKI (acute kidney  injury) ([place])   Chronic atrial fibrillation ([place])   Acute respiratory failure ([place])   Internal bleeding   DNR (do not resuscitate)   Pubic ramus fracture ([facility])   Acute LEFT Acetabular and left ramus fracture Secondary to fall:  -Orthopedics recommends [facility] LLE. F/u with Dr. Marcelino Scot in office in 2-3 weeks. -PT/OT recommend SNF.  Have spoken with family awaiting paperwork to go through. -Out of bed to chair q shift  LEFT rotator cuff tear? -4/30 s/p fall patient with extreme pain when PT/OT attempted to work with patient today -LEFT SHOULDER EXAM; positive Hawkins test, positive external rotation test, positive Neer test, positive painful arc test.  Pain control -4/30 DC Fentanyl 12.5 mcg PRN.  Preparation for patient going to SNF -Robaxin 500 mg TID PRN -4/30 DC OxyContin 10 mg BID; per daughter has not required even when PT/OT worked with patient -4/30 Tylenol PRN -4/30 tramadol 50 mg BID  Acute respiratory failure with hypoxia -Titrate O2 to maintain SPO2> 92% - Incentive spirometry  Extraperitoneal hemorrhage  Possible intracystic hemorrhage  Macrocytic anemia -Anemia panel consistent with macrocytic anemia, low B12, low folate -B12 1000 mcg IM.---> B12 p.o. 50 mcg daily -Folate IV 1 mg daily Recent Labs  Lab 09/19/19 0606 09/20/19 0553 09/21/19 0340 09/22/19 0504  HGB 9.6* 8.2* 7.8* 7.8*  -Hemoglobin stable, continue to follow closely  Acute kidney injury (baseline Cr 1.0)/Acute Urine Retention Recent Labs  Lab 09/19/19 0606  09/20/19 0553 09/21/19 0340 09/22/19 0504  CREATININE 1.73* 1.56* 1.59* 1.74*  -Per EMR patient having episodes of urine retention, bladder scan  QID -Orders placed for in and out cath -If patient continues to have episodes of Urine Retention Place, Foley catheter -Urinalysis nondiagnostic for cause of urinary retention -If urine retention continues obtain kidney US -Strict in and out -726.83ml -Daily weight -4/29 multiple bladder  scans have shown negative retention. -Flomax 0.4 mg daily  Chronic atrial fibrillation -On chronic Coumadin (held) secondary to bleed Recent Labs  Lab 09/19/19 0606 09/20/19 0553 09/21/19 0340 09/22/19 0504  INR 2.5* 3.3* 2.8* 2.5*  -Therapeutic -4/29 spoke at length with daughter and wife and given how unsteady patient was prior to injury agreed that should not restart full strength anticoagulation. -4/29 ASA 324 mg daily when INR begins to normalize.  Thrombocytopenia (acute on chronic).  Baseline 114 -4/29 continuing to slowly trend down.  DIC secondary to trauma? -Currently no signs of overt bleeding  Dysphagia -Family request that we liberalize diet to regular soft diet.  Given patient's age in comorbidities will comply.  Goals of care -4/28 PT/OT; evaluate for CIR vs SNF   DVT prophylaxis: SCD Code Status: DNR Family Communication:  Disposition Plan:  1.  Where the patient is from 2.  Anticipated d/c place. 3.  Barriers to d/c    Consultants:  Orthopedic surgery   Procedures/Significant Events:  4/27 CT pelvis Wo contrast;Nondisplaced fracture through the left obturator ring and acetabulum. 2. Mild extraperitoneal hemorrhage in the left pelvis. 3. 6 cm partially covered high-density mass at the right flank where there was a renal cyst seen in 2012. This presumably reflects intracystic hemorrhage, but is incompletely visualized and solid mass is not excluded. Enhanced CT or renal ultrasound could be attempted if appropriate for comorbidities. 4/27 CT head W0 contrast; negative acute finding    I have personally reviewed and interpreted all radiology studies and my findings are as above.  VENTILATOR SETTINGS:    Cultures   Antimicrobials:    Devices    LINES / TUBES:      Continuous Infusions:   Objective: Vitals:   09/21/19 2034 09/22/19 0140 09/22/19 0540 09/22/19 0739  BP: (!) 92/59 (!) 94/56 107/83 104/66  Pulse: 60 72 72 67   Resp: 19 19 19 20   Temp: (!) 97.4 F (36.3 C) 98.3 F (36.8 C) 97.6 F (36.4 C) 98.2 F (36.8 C)  TempSrc: Oral Oral Oral Oral  SpO2: 93% 91% 96% 96%  Weight:   82.8 kg   Height:        Intake/Output Summary (Last 24 hours) at 09/22/2019 0957 Last data filed at 09/22/2019 [id number] Gross per 24 hour  Intake 1020 ml  Output 477 ml  Net 543 ml   Filed Weights   09/20/19 0442 09/21/19 0134 09/22/19 0540  Weight: 81.1 kg 81.5 kg 82.8 kg   Physical Exam:  General: No acute respiratory distress Eyes: negative scleral hemorrhage, negative anisocoria, negative icterus ENT: Negative Runny nose, negative gingival bleeding, poor dentition Neck:  Negative scars, masses, torticollis, lymphadenopathy, JVD Lungs: Clear to auscultation bilaterally without wheezes or crackles Cardiovascular: Regular rate and rhythm without murmur gallop or rub normal S1 and S2 Abdomen: negative abdominal pain, nondistended, positive soft, bowel sounds, no rebound, no ascites, no appreciable mass Extremities: LEFT SHOULDER EXAM; positive Hawkins test, positive external rotation test, positive Neer test, positive painful arc test. Skin: Negative rashes, lesions, ulcers Psychiatric:  Negative depression, negative anxiety, negative  fatigue, negative mania  Central nervous system:  Cranial nerves II through XII intact, tongue/uvula midline, all extremities muscle strength 5/5, (unable to fully move left upper extremity secondary to pain) sensation intact throughout, negative dysarthria, negative expressive aphasia, negative receptive aphasia.  .     Data Reviewed: Care during the described time interval was provided by me .  I have reviewed this patient's available data, including medical history, events of note, physical examination, and all test results as part of my evaluation.  CBC: Recent Labs  Lab 09/19/19 0606 09/20/19 0553 09/21/19 0340 09/22/19 0504  WBC 8.2 5.8 5.9 5.0  NEUTROABS 7.1  --   --   --    HGB 9.6* 8.2* 7.8* 7.8*  HCT 32.1* 26.8* 26.2* 25.4*  MCV 95.8 94.7 94.9 94.8  PLT 114* 109* 100* [id number]*   Basic Metabolic Panel: Recent Labs  Lab 09/19/19 0606 09/20/19 0553 09/21/19 0340 09/22/19 0504  NA 142 138 138 135  K 4.5 4.6 4.4 4.6  CL 107 106 106 103  CO2 27 26 26 26   GLUCOSE 140* 101* 100* 95  BUN 38* 34* 37* 42*  CREATININE 1.73* 1.56* 1.59* 1.74*  CALCIUM 8.9 8.7* 8.6* 8.4*  MG  --   --  2.3 2.3  PHOS  --   --  3.0 3.1   GFR: Estimated Creatinine Clearance: 27.3 mL/min (A) (by C-G formula based on SCr of 1.74 mg/dL (H)). Liver Function Tests: Recent Labs  Lab 09/21/19 0340 09/22/19 0504  AST 15 17  ALT 14 13  ALKPHOS 46 45  BILITOT 1.1 0.9  PROT 5.3* 5.2*  ALBUMIN 2.8* 2.7*   No results for input(s): LIPASE, AMYLASE in the last 168 hours. No results for input(s): AMMONIA in the last 168 hours. Coagulation Profile: Recent Labs  Lab 09/19/19 0606 09/20/19 0553 09/21/19 0340 09/22/19 0504  INR 2.5* 3.3* 2.8* 2.5*   Cardiac Enzymes: No results for input(s): CKTOTAL, CKMB, CKMBINDEX, TROPONINI in the last 168 hours. BNP (last 3 results) No results for input(s): PROBNP in the last 8760 hours. HbA1C: No results for input(s): HGBA1C in the last 72 hours. CBG: No results for input(s): GLUCAP in the last 168 hours. Lipid Profile: No results for input(s): CHOL, HDL, LDLCALC, TRIG, CHOLHDL, LDLDIRECT in the last 72 hours. Thyroid Function Tests: No results for input(s): TSH, T4TOTAL, FREET4, T3FREE, THYROIDAB in the last 72 hours. Anemia Panel: Recent Labs    09/20/19 1152  VITAMINB12 174*  FOLATE 5.6*  FERRITIN 130  TIBC 207*  IRON 21*  RETICCTPCT 1.5   Sepsis Labs: No results for input(s): PROCALCITON, LATICACIDVEN in the last 168 hours.  Recent Results (from the past 240 hour(s))  Respiratory Panel by RT PCR (Flu A&B, Covid) - Nasopharyngeal Swab     Status: None   Collection Time: 09/19/19 11:35 AM   Specimen: Nasopharyngeal Swab   Result Value Ref Range Status   SARS Coronavirus 2 by RT PCR NEGATIVE NEGATIVE Final    Comment: (NOTE) SARS-CoV-2 target nucleic acids are NOT DETECTED. The SARS-CoV-2 RNA is generally detectable in upper respiratoy specimens during the acute phase of infection. The lowest concentration of SARS-CoV-2 viral copies this assay can detect is 131 copies/mL. A negative result does not preclude SARS-Cov-2 infection and should not be used as the sole basis for treatment or other patient management decisions. A negative result may occur with  improper specimen collection/handling, submission of specimen other than nasopharyngeal swab, presence of viral mutation(s) within the areas  targeted by this assay, and inadequate number of viral copies (<131 copies/mL). A negative result must be combined with clinical observations, patient history, and epidemiological information. The expected result is Negative. Fact Sheet for Patients:  PinkCheek.be Fact Sheet for Healthcare Providers:  GravelBags.it This test is not yet ap proved or cleared by the [place] FDA and  has been authorized for detection and/or diagnosis of SARS-CoV-2 by FDA under an Emergency Use Authorization (EUA). This EUA will remain  in effect (meaning this test can be used) for the duration of the COVID-19 declaration under Section 564(b)(1) of the Act, 21 U.S.C. section 360bbb-3(b)(1), unless the authorization is terminated or revoked sooner.    Influenza A by PCR NEGATIVE NEGATIVE Final   Influenza B by PCR NEGATIVE NEGATIVE Final    Comment: (NOTE) The Xpert Xpress SARS-CoV-2/FLU/RSV assay is intended as an aid in  the diagnosis of influenza from Nasopharyngeal swab specimens and  should not be used as a sole basis for treatment. Nasal washings and  aspirates are unacceptable for Xpert Xpress SARS-CoV-2/FLU/RSV  testing. Fact Sheet for  Patients: PinkCheek.be Fact Sheet for Healthcare Providers: GravelBags.it This test is not yet approved or cleared by the [place] FDA and  has been authorized for detection and/or diagnosis of SARS-CoV-2 by  FDA under an Emergency Use Authorization (EUA). This EUA will remain  in effect (meaning this test can be used) for the duration of the  Covid-19 declaration under Section 564(b)(1) of the Act, 21  U.S.C. section 360bbb-3(b)(1), unless the authorization is  terminated or revoked. Performed at [facility], [place] [address] [place], [place] [address]          Radiology Studies: No results found.      Scheduled Meds: . diclofenac Sodium  2 g Topical QID  . folic acid  1 mg Oral Daily  . oxyCODONE  10 mg Oral Q12H  . polyethylene glycol  17 g Oral Daily  . sodium chloride flush  3 mL Intravenous Q12H  . tamsulosin  0.4 mg Oral Daily  . vitamin B-12  50 mcg Oral Daily   Continuous Infusions:   LOS: 3 days    Time spent:40 min    Masin Shatto, Geraldo Docker, MD [facility] Pager [phone]  If 7PM-7AM, please contact night-coverage [email or web address] Password [facility] 09/22/2019, 9:57 AM

## 2019-09-22 NOTE — Progress Notes (Signed)
Physical Therapy Treatment Patient Details Name: Chad Buchanan. MRN: [id number] DOB: [birth year] Today's Date: 09/22/2019    History of Present Illness Pt is a 86 y/o male admitted following fall. Found to have L acetabular and obturator fx. Also with mild extraperitoneal hemorrhage in L pelvis. PMH includes a fib, CHF, COPD, and AAA.     PT Comments    Staff requesting assistance with return of pt to bed given weightbearing status. Educated on lateral scoot transfers to affected side. Pt able to achieve with modAx2. Pt requires total assist to bring LE back into bed. Pt appreciated sitting up and was working on a puzzle on entry. Bed placed in chair position so he could continue his puzzle and worked with PT on LE exercises and positioning.     Follow Up Recommendations  SNF;Supervision/Assistance - 24 hour     Equipment Recommendations  Wheelchair (measurements PT);Wheelchair cushion (measurements PT)    Recommendations for Other Services       Precautions / Restrictions Precautions Precautions: Fall Precaution Comments: Pt with fall at home.  Restrictions Weight Bearing Restrictions: Yes LLE Weight Bearing: Touchdown weight bearing    Mobility  Bed Mobility Overal bed mobility: Needs Assistance Bed Mobility: Sit to Supine     Supine to sit: Max assist;+2 for physical assistance Sit to supine: Total assist   General bed mobility comments: total assist for LE mangement into bed and centering trunk   Transfers Overall transfer level: Needs assistance   Transfers: Lateral/Scoot Transfers          Lateral/Scoot Transfers: Mod assist;+2 physical assistance General transfer comment: cues for technique and correct hand placement for lateral scoot from recliner back to bed   Ambulation/Gait             General Gait Details: unable due to weakness and weightbearing restrictions       Balance Overall balance assessment: Needs assistance Sitting-balance  support: No upper extremity supported;Feet supported Sitting balance-[name]: Fair Sitting balance - Comments: flexed posture                                    Cognition Arousal/Alertness: Awake/alert Behavior During Therapy: WFL for tasks assessed/performed Overall Cognitive Status: Within Functional Limits for tasks assessed                                 General Comments: Very [facility]      Exercises General Exercises - Lower Extremity Ankle Circles/Pumps: AROM;Both;10 reps;Seated Long Arc Quad: AROM;Left;10 reps;Seated    General Comments General comments (skin integrity, edema, etc.): daughter in-law in room, educated in positioning       Pertinent Vitals/Pain Pain Assessment: Faces Faces Pain Scale: Hurts even more Pain Location: L hip and L arm Pain Descriptors / Indicators: Grimacing;Guarding Pain Intervention(s): Limited activity within patient's tolerance;Monitored during session;Repositioned           PT Goals (current goals can now be found in the care plan section) Acute Rehab PT Goals PT Goal Formulation: With patient Time For Goal Achievement: 10/04/19 Potential to Achieve Goals: Good Progress towards PT goals: Progressing toward goals    Frequency    Min 2X/week      PT Plan Current plan remains appropriate    Co-evaluation   Reason for Co-Treatment: Complexity of the patient's impairments (multi-system involvement);To address  functional/ADL transfers   OT goals addressed during session: ADL's and self-care;Proper use of Adaptive equipment and DME      AM-PAC PT "6 Clicks" Mobility   Outcome Measure  Help needed turning from your back to your side while in a flat bed without using bedrails?: Total Help needed moving from lying on your back to sitting on the side of a flat bed without using bedrails?: Total Help needed moving to and from a bed to a chair (including a wheelchair)?: Total Help needed standing  up from a chair using your arms (e.g., wheelchair or bedside chair)?: Total Help needed to walk in hospital room?: Total Help needed climbing 3-5 steps with a railing? : Total 6 Click Score: 6    End of Session Equipment Utilized During Treatment: Gait belt Activity Tolerance: Patient tolerated treatment well Patient left: with call bell/phone within reach;in chair;with chair alarm set;with family/visitor present Nurse Communication: Mobility status PT Visit Diagnosis: Muscle weakness (generalized) (M62.81);History of falling (Z91.81);Difficulty in walking, not elsewhere classified (R26.2);Pain Pain - Right/Left: Left Pain - part of body: Hip;Arm     Time: 1430-1449 PT Time Calculation (min) (ACUTE ONLY): 19 min  Charges:  $Therapeutic Activity: 8-22 mins                     Aairah Negrette B. Migdalia Dk PT, DPT Acute Rehabilitation Services Pager [phone] Office [phone]    [place] 09/22/2019, 5:04 PM

## 2019-09-23 ENCOUNTER — Encounter (HOSPITAL_COMMUNITY): Payer: Self-pay | Admitting: Internal Medicine

## 2019-09-23 DIAGNOSIS — M12812 Other specific arthropathies, not elsewhere classified, left shoulder: Secondary | ICD-10-CM

## 2019-09-23 DIAGNOSIS — M75102 Unspecified rotator cuff tear or rupture of left shoulder, not specified as traumatic: Secondary | ICD-10-CM | POA: Diagnosis present

## 2019-09-23 DIAGNOSIS — S32592A Other specified fracture of left pubis, initial encounter for closed fracture: Secondary | ICD-10-CM

## 2019-09-23 DIAGNOSIS — S46012A Strain of muscle(s) and tendon(s) of the rotator cuff of left shoulder, initial encounter: Secondary | ICD-10-CM

## 2019-09-23 HISTORY — DX: Other specific arthropathies, not elsewhere classified, left shoulder: M12.812

## 2019-09-23 LAB — COMPREHENSIVE METABOLIC PANEL
ALT: 14 U/L (ref 0–44)
AST: 16 U/L (ref 15–41)
Albumin: 2.8 g/dL — ABNORMAL LOW (ref 3.5–5.0)
Alkaline Phosphatase: 48 U/L (ref 38–126)
Anion gap: 7 (ref 5–15)
BUN: 47 mg/dL — ABNORMAL HIGH (ref 8–23)
CO2: 26 mmol/L (ref 22–32)
Calcium: 8.7 mg/dL — ABNORMAL LOW (ref 8.9–10.3)
Chloride: 103 mmol/L (ref 98–111)
Creatinine, Ser: 1.55 mg/dL — ABNORMAL HIGH (ref 0.61–1.24)
GFR calc Af Amer: 43 mL/min — ABNORMAL LOW (ref >60)
GFR calc non Af Amer: 37 mL/min — ABNORMAL LOW (ref >60)
Glucose, Bld: 109 mg/dL — ABNORMAL HIGH (ref 70–99)
Potassium: 4.7 mmol/L (ref 3.5–5.1)
Sodium: 136 mmol/L (ref 135–145)
Total Bilirubin: 1.2 mg/dL (ref 0.3–1.2)
Total Protein: 5.6 g/dL — ABNORMAL LOW (ref 6.5–8.1)

## 2019-09-23 LAB — CBC
HCT: 26.3 % — ABNORMAL LOW (ref 39.0–52.0)
Hemoglobin: 8.3 g/dL — ABNORMAL LOW (ref 13.0–17.0)
MCH: 29.3 pg (ref 26.0–34.0)
MCHC: 31.6 g/dL (ref 30.0–36.0)
MCV: 92.9 fL (ref 80.0–100.0)
Platelets: 122 10*3/uL — ABNORMAL LOW (ref 150–400)
RBC: 2.83 MIL/uL — ABNORMAL LOW (ref 4.22–5.81)
RDW: 14.5 % (ref 11.5–15.5)
WBC: 5.5 10*3/uL (ref 4.0–10.5)
nRBC: 0 % (ref 0.0–0.2)

## 2019-09-23 LAB — PROTIME-INR
INR: 2.2 — ABNORMAL HIGH (ref 0.8–1.2)
Prothrombin Time: 23.3 seconds — ABNORMAL HIGH (ref 11.4–15.2)

## 2019-09-23 LAB — MAGNESIUM: Magnesium: 2.4 mg/dL (ref 1.7–2.4)

## 2019-09-23 LAB — SARS CORONAVIRUS 2 (TAT 6-24 HRS): SARS Coronavirus 2: NEGATIVE

## 2019-09-23 LAB — PHOSPHORUS: Phosphorus: 3.2 mg/dL (ref 2.5–4.6)

## 2019-09-23 MED ORDER — METHYLPREDNISOLONE ACETATE 40 MG/ML IJ SUSP
40.0000 mg | Freq: Once | INTRAMUSCULAR | Status: AC
  Administered 2019-09-23: 16:00:00 40 mg via INTRA_ARTICULAR
  Filled 2019-09-23: qty 1

## 2019-09-23 MED ORDER — BUPIVACAINE HCL (PF) 0.5 % IJ SOLN
10.0000 mL | Freq: Once | INTRAMUSCULAR | Status: AC
  Administered 2019-09-23: 16:00:00 10 mL
  Filled 2019-09-23: qty 10

## 2019-09-23 NOTE — Progress Notes (Signed)
Called by medical service regarding left upper extremity pain in the shoulder.  X-rays fairly benign, MRI demonstrates massive chronic rotator cuff tear with retraction.  There is certainly may be an acute on chronic presentation of this given his recent fall.  We will plan to inject his shoulder with Depo-Medrol and Marcaine for pain control, sling as needed for comfort, okay to use the arm as much as he can tolerate, okay to weight-bear with it if he is capable.  Touch toe weightbearing for the left acetabular fracture.  This is an extremely difficult and complicated constellation of injuries that limits his capacity to care for himself.  Full consult to follow.  Marchia Bond, MD

## 2019-09-23 NOTE — Progress Notes (Signed)
PROGRESS NOTE    Chad Buchanan.  UF:[id number] DOB: [birth year] DOA: 09/19/2019 PCP: Crist Infante, MD     Brief Narrative:  86 y.o. male PMHx HTN, paroxysmal atrial fibrillation on Coumadin, chronic diastolic CHF last EF 50 to 55% in 2016, aortic aneurysm, and BPH   Presents after having a fall at home around 1 AM.  Patient reports that he is not exactly sure of how he fell, but thinks that his foot slipped causing him to fall landed on his left side in the kitchen.  He does not recall losing consciousness or hitting his head.  He had to call out to his grandson to help him get up and he was able to make it to his bedroom.  He complained of pain in his left hip and shoulder and was unable to get back up.  Pain was worsened with any kind of movement.  Associated symptoms include muscle spasms and shortness of breath due to.  At baseline he does not report being on home oxygen.  He is on blood thinners of Coumadin.  ED Course: Upon admission into the emergency department patient was noted to be afebrile with  O2 saturations noted as low as 83% with improvement on 2 L of nasal cannula oxygen.  CT scan of the brain showed no acute abnormalities.  Labs significant for hemoglobin 9.6, platelets 114, BUN 38, creatinine 1.73, glucose 140, and BNP 153.8.  Chest x-ray noted chronic diffuse interstitial lung disease unchanged.  CT imaging revealed a nondisplaced fracture of the left obturator ring and acetabulum along with signs of extraperitoneal hemorrhage.  COVID-19 and influenza screening were negative.  Orthopedics was consulted, but recommended no surgery..  Patient had been given fentanyl and albuterol inhaler.  TRH called to admit.  Subjective: 5/1 patient sleepy but arousable.  Per daughter worked out this morning with physical therapy and just completed lunch.  Patient appears comfortable.  Patient states PT worked him pretty hard today.  Assessment & Plan:   Principal Problem:   Left  acetabular fracture ([place]) Active Problems:   Fall   AKI (acute kidney injury) ([place])   Chronic atrial fibrillation ([place])   Acute respiratory failure ([place])   Internal bleeding   DNR (do not resuscitate)   Pubic ramus fracture (HCC)   Left rotator cuff tear   Acute LEFT Acetabular and left ramus fracture Secondary to fall:  -Orthopedics recommends TDWB LLE. F/u with Dr. Marcelino Scot in office in 2-3 weeks. -PT/OT recommend SNF.  Have spoken with family awaiting paperwork to go through. -Out of bed to chair q shift  LEFT rotator cuff tear -4/30 s/p fall patient with extreme pain when PT/OT attempted to work with patient today -LEFT SHOULDER EXAM; positive Hawkins test, positive external rotation test, positive Neer test, positive painful arc test. -5/1 MRI left shoulder positive for severe rotator cuff tear.  Discussed case with Dr. Mardelle Matte sports medicine who reviewed CT, will see patient this afternoon and attempt an injection first given that whole shoulder would need to be replaced.  Will await further recommendations.  Pain control -4/30 DC Fentanyl 12.5 mcg PRN.  Preparation for patient going to SNF -Robaxin 500 mg TID PRN -4/30 DC OxyContin 10 mg BID; per daughter has not required even when PT/OT worked with patient -4/30 Tylenol PRN -4/30 tramadol 50 mg BID  Acute respiratory failure with hypoxia -Titrate O2 to maintain SPO2> 92% - Incentive spirometry  Extraperitoneal hemorrhage -stable as hemoglobin has remained stable.  Possible  intracystic hemorrhage -Stable: Hemoglobin has remained stable  Macrocytic anemia -Anemia panel consistent with macrocytic anemia, low B12, low folate -B12 1000 mcg IM.---> B12 p.o. 50 mcg daily -Folate IV 1 mg daily Recent Labs  Lab 09/19/19 0606 09/20/19 0553 09/21/19 0340 09/22/19 0504 09/23/19 0433  HGB 9.6* 8.2* 7.8* 7.8* 8.3*  -Hemoglobin stable, continue to follow closely  Acute kidney injury (baseline Cr 1.0)/Acute Urine  Retention Recent Labs  Lab 09/19/19 0606 09/20/19 0553 09/21/19 0340 09/22/19 0504 09/23/19 0433  CREATININE 1.73* 1.56* 1.59* 1.74* 1.55*  -Per EMR patient having episodes of urine retention, bladder scan  QID -Orders placed for in and out cath -If patient continues to have episodes of Urine Retention Place, Foley catheter -Urinalysis nondiagnostic for cause of urinary retention -If urine retention continues obtain kidney US -Strict in and out +476.35ml -Daily weight -4/29 multiple bladder scans have shown negative retention.  Acute urinary retention resolved -Flomax 0.4 mg daily  Chronic atrial fibrillation -On chronic Coumadin (held) secondary to bleed Recent Labs  Lab 09/19/19 0606 09/20/19 0553 09/21/19 0340 09/22/19 0504 09/23/19 0433  INR 2.5* 3.3* 2.8* 2.5* 2.2*  -Therapeutic -4/29 spoke at length with daughter and wife and given how unsteady patient was prior to injury agreed that should not restart full strength anticoagulation. -4/29 ASA 324 mg daily when INR begins to normalize.  Thrombocytopenia (acute on chronic).  Baseline 114 -4/29 continuing to slowly trend down.  DIC secondary to trauma? -Currently no signs of overt bleeding -5/1 appears platelets have hit their nadir trending back up  Dysphagia -Family request that we liberalize diet to regular soft diet.  Given patient's age in comorbidities will comply.  Goals of care -4/28 PT/OT; evaluate for CIR vs SNF   DVT prophylaxis: SCD Code Status: DNR Family Communication:  Disposition Plan:  1.  Where the patient is from 2.  Anticipated d/c place. 3.  Barriers to d/c    Consultants:  Dr. Mardelle Matte sports medicine   Procedures/Significant Events:  4/27 CT pelvis Wo contrast;Nondisplaced fracture through the left obturator ring and acetabulum. 2. Mild extraperitoneal hemorrhage in the left pelvis. 3. 6 cm partially covered high-density mass at the right flank where there was a renal cyst seen in  2012. This presumably reflects intracystic hemorrhage, but is incompletely visualized and solid mass is not excluded. Enhanced CT or renal ultrasound could be attempted if appropriate for comorbidities. 4/27 CT head W0 contrast; negative acute finding 4/29 MRI LEFT shoulder W0 contrast; -Complete full-thickness tears of the supraspinatus and infraspinatus tendons with approximately 3.3 cm of tendon retraction, resultant high-riding humeral head and fluid in the subacromial-subdeltoid bursa. 2. There is mild muscular atrophy of the supraspinatus and infraspinatus muscle bellies with diffuse muscular edema seen surrounding the shoulder. 3. Focal full-thickness tear of the superior subscapularis tendon with approximately 2 cm of tendon retraction. 4. Medial subluxation of the long head of the biceps tendon with a partial interstitial tear and a multilocular 1.2 cm ganglion cyst within the bicipital groove. 5. Moderate glenohumeral joint osteoarthritis with diffuse labral degeneration and probable nondisplaced tear of the posteroinferior labrum. 6. Moderate to advanced AC joint arthrosis.   I have personally reviewed and interpreted all radiology studies and my findings are as above.  VENTILATOR SETTINGS:    Cultures   Antimicrobials:    Devices    LINES / TUBES:      Continuous Infusions:   Objective: Vitals:   09/22/19 2036 09/23/19 0014 09/23/19 0756 09/23/19 1153  BP:  117/82 132/80 132/81 133/77  Pulse: 66 64 86 71  Resp: 20  18 18   Temp: 97.7 F (36.5 C) 97.9 F (36.6 C)  97.8 F (36.6 C)  TempSrc: Oral Oral  Oral  SpO2: 91% 90% 90% 98%  Weight:      Height:        Intake/Output Summary (Last 24 hours) at 09/23/2019 1400 Last data filed at 09/23/2019 1330 Gross per 24 hour  Intake 900 ml  Output 717 ml  Net 183 ml   Filed Weights   09/20/19 0442 09/21/19 0134 09/22/19 0540  Weight: 81.1 kg 81.5 kg 82.8 kg   Physical Exam:**  General: A/O x4,  no acute respiratory distress Eyes: negative scleral hemorrhage, negative anisocoria, negative icterus ENT: Negative Runny nose, negative gingival bleeding, Neck:  Negative scars, masses, torticollis, lymphadenopathy, JVD Lungs: Clear to auscultation bilaterally without wheezes or crackles Cardiovascular: Regular rate and rhythm without murmur gallop or rub normal S1 and S2 Abdomen: negative abdominal pain, nondistended, positive soft, bowel sounds, no rebound, no ascites, no appreciable mass Extremities: LEFT SHOULDER EXAM; positive Hawkins test, positive external rotation test, positive Neer test, positive painful arc test. Skin: Negative rashes, lesions, ulcers Psychiatric:  Negative depression, negative anxiety, negative fatigue, negative mania  Central nervous system:  Cranial nerves II through XII intact, tongue/uvula midline, all extremities muscle strength 5/5, (unable to fully move left upper extremity secondary to pain and acute on chronic rotator cuff tear) sensation intact throughout, negative dysarthria, negative expressive aphasia, negative receptive aphasia.   .     Data Reviewed: Care during the described time interval was provided by me .  I have reviewed this patient's available data, including medical history, events of note, physical examination, and all test results as part of my evaluation.  CBC: Recent Labs  Lab 09/19/19 0606 09/20/19 0553 09/21/19 0340 09/22/19 0504 09/23/19 0433  WBC 8.2 5.8 5.9 5.0 5.5  NEUTROABS 7.1  --   --   --   --   HGB 9.6* 8.2* 7.8* 7.8* 8.3*  HCT 32.1* 26.8* 26.2* 25.4* 26.3*  MCV 95.8 94.7 94.9 94.8 92.9  PLT 114* 109* 100* 105* 123XX123*   Basic Metabolic Panel: Recent Labs  Lab 09/19/19 0606 09/20/19 0553 09/21/19 0340 09/22/19 0504 09/23/19 0433  NA 142 138 138 135 136  K 4.5 4.6 4.4 4.6 4.7  CL 107 106 106 103 103  CO2 27 26 26 26 26   GLUCOSE 140* 101* 100* 95 109*  BUN 38* 34* 37* 42* 47*  CREATININE 1.73* 1.56* 1.59*  1.74* 1.55*  CALCIUM 8.9 8.7* 8.6* 8.4* 8.7*  MG  --   --  2.3 2.3 2.4  PHOS  --   --  3.0 3.1 3.2   GFR: Estimated Creatinine Clearance: 30.6 mL/min (A) (by C-G formula based on SCr of 1.55 mg/dL (H)). Liver Function Tests: Recent Labs  Lab 09/21/19 0340 09/22/19 0504 09/23/19 0433  AST 15 17 16   ALT 14 13 14   ALKPHOS 46 45 48  BILITOT 1.1 0.9 1.2  PROT 5.3* 5.2* 5.6*  ALBUMIN 2.8* 2.7* 2.8*   No results for input(s): LIPASE, AMYLASE in the last 168 hours. No results for input(s): AMMONIA in the last 168 hours. Coagulation Profile: Recent Labs  Lab 09/19/19 0606 09/20/19 0553 09/21/19 0340 09/22/19 0504 09/23/19 0433  INR 2.5* 3.3* 2.8* 2.5* 2.2*   Cardiac Enzymes: No results for input(s): CKTOTAL, CKMB, CKMBINDEX, TROPONINI in the last 168 hours. BNP (last 3 results)  No results for input(s): PROBNP in the last 8760 hours. HbA1C: No results for input(s): HGBA1C in the last 72 hours. CBG: No results for input(s): GLUCAP in the last 168 hours. Lipid Profile: No results for input(s): CHOL, HDL, LDLCALC, TRIG, CHOLHDL, LDLDIRECT in the last 72 hours. Thyroid Function Tests: No results for input(s): TSH, T4TOTAL, FREET4, T3FREE, THYROIDAB in the last 72 hours. Anemia Panel: No results for input(s): VITAMINB12, FOLATE, FERRITIN, TIBC, IRON, RETICCTPCT in the last 72 hours. Sepsis Labs: No results for input(s): PROCALCITON, LATICACIDVEN in the last 168 hours.  Recent Results (from the past 240 hour(s))  Respiratory Panel by RT PCR (Flu A&B, Covid) - Nasopharyngeal Swab     Status: None   Collection Time: 09/19/19 11:35 AM   Specimen: Nasopharyngeal Swab  Result Value Ref Range Status   SARS Coronavirus 2 by RT PCR NEGATIVE NEGATIVE Final    Comment: (NOTE) SARS-CoV-2 target nucleic acids are NOT DETECTED. The SARS-CoV-2 RNA is generally detectable in upper respiratoy specimens during the acute phase of infection. The lowest concentration of SARS-CoV-2 viral copies  this assay can detect is 131 copies/mL. A negative result does not preclude SARS-Cov-2 infection and should not be used as the sole basis for treatment or other patient management decisions. A negative result may occur with  improper specimen collection/handling, submission of specimen other than nasopharyngeal swab, presence of viral mutation(s) within the areas targeted by this assay, and inadequate number of viral copies (<131 copies/mL). A negative result must be combined with clinical observations, patient history, and epidemiological information. The expected result is Negative. Fact Sheet for Patients:  PinkCheek.be Fact Sheet for Healthcare Providers:  GravelBags.it This test is not yet ap proved or cleared by the [place] FDA and  has been authorized for detection and/or diagnosis of SARS-CoV-2 by FDA under an Emergency Use Authorization (EUA). This EUA will remain  in effect (meaning this test can be used) for the duration of the COVID-19 declaration under Section 564(b)(1) of the Act, 21 U.S.C. section 360bbb-3(b)(1), unless the authorization is terminated or revoked sooner.    Influenza A by PCR NEGATIVE NEGATIVE Final   Influenza B by PCR NEGATIVE NEGATIVE Final    Comment: (NOTE) The Xpert Xpress SARS-CoV-2/FLU/RSV assay is intended as an aid in  the diagnosis of influenza from Nasopharyngeal swab specimens and  should not be used as a sole basis for treatment. Nasal washings and  aspirates are unacceptable for Xpert Xpress SARS-CoV-2/FLU/RSV  testing. Fact Sheet for Patients: PinkCheek.be Fact Sheet for Healthcare Providers: GravelBags.it This test is not yet approved or cleared by the [place] FDA and  has been authorized for detection and/or diagnosis of SARS-CoV-2 by  FDA under an Emergency Use Authorization (EUA). This EUA will remain  in  effect (meaning this test can be used) for the duration of the  Covid-19 declaration under Section 564(b)(1) of the Act, 21  U.S.C. section 360bbb-3(b)(1), unless the authorization is  terminated or revoked. Performed at [facility], [place] [address], [place], [place] [address]          Radiology Studies: MR SHOULDER LEFT WO CONTRAST  Result Date: 09/22/2019 CLINICAL DATA:  Rotator cuff tear suspected EXAM: MRI OF THE LEFT SHOULDER WITHOUT CONTRAST TECHNIQUE: Multiplanar, multisequence MR imaging of the shoulder was performed. No intravenous contrast was administered. COMPARISON:  None. FINDINGS: Rotator cuff: There is chronic complete full-thickness tears of the supraspinatus and infraspinatus tendons with approximately 3.3 cm in tendon retraction. There  is also a focal full-thickness tear of the superior subscapularis tendon with approximately 2 cm of tendon retraction. The teres minor tendon appears to be intact. There is mild fatty atrophy of the supraspinatus and infraspinatus muscle bellies. There is diffuse muscle belly edema seen throughout the infraspinatus, teres minor, and subscapularis. A high-riding humeral head and resultant fluid in the subacromial-subdeltoid bursa is seen. Muscles:  There is edema seen within the deltoid musculature. Biceps Long Head: There is medial subluxation of the long head of the biceps tendon from the bicipital groove with increased intrasubstance signal and a interstitial partial tear. There is a multilocular 1.2 cm probable ganglion cyst within the bicipital groove. Acromioclavicular Joint: Moderate to advanced AC joint arthrosis is seen with joint space loss and subchondral cystic changes. Type II acromion. Glenohumeral Joint: There is a high-riding humeral head. Moderate AC joint arthrosis is seen with joint space loss, chondral thinning and subchondral cystic changes. No joint effusion. Labrum: There is diffuse globular signal and thickening seen  throughout the labrum. A probable nondisplaced tear seen through the posteroinferior labrum. Bones: No fracture, osteonecrosis, or pathologic marrow infiltration. Other: There is fluid in the subacromial-subdeltoid bursa. IMPRESSION: 1. Complete full-thickness tears of the supraspinatus and infraspinatus tendons with approximately 3.3 cm of tendon retraction, resultant high-riding humeral head and fluid in the subacromial-subdeltoid bursa. 2. There is mild muscular atrophy of the supraspinatus and infraspinatus muscle bellies with diffuse muscular edema seen surrounding the shoulder. 3. Focal full-thickness tear of the superior subscapularis tendon with approximately 2 cm of tendon retraction. 4. Medial subluxation of the long head of the biceps tendon with a partial interstitial tear and a multilocular 1.2 cm ganglion cyst within the bicipital groove. 5. Moderate glenohumeral joint osteoarthritis with diffuse labral degeneration and probable nondisplaced tear of the posteroinferior labrum. 6. Moderate to advanced AC joint arthrosis. Electronically Signed   By: Prudencio Pair M.D.   On: 09/22/2019 19:49        Scheduled Meds: . bupivacaine  10 mL Infiltration Once  . diclofenac Sodium  2 g Topical QID  . folic acid  1 mg Oral Daily  . methylPREDNISolone acetate  40 mg Intra-articular Once  . polyethylene glycol  17 g Oral Daily  . sodium chloride flush  3 mL Intravenous Q12H  . tamsulosin  0.4 mg Oral Daily  . traMADol  50 mg Oral BID  . vitamin B-12  50 mcg Oral Daily   Continuous Infusions:   LOS: 4 days    Time spent:40 min    Kevia Zaucha, Geraldo Docker, MD [facility] Pager [phone]  If 7PM-7AM, please contact night-coverage [email or web address] Password [email or web address] 09/23/2019, 2:00 PM

## 2019-09-23 NOTE — TOC Progression Note (Signed)
Transition of Care (TOC) - Progression Note    Patient Details  Name: Chad Buchanan. MRN: [id number] Date of Birth: 1920-10-31  Transition of Care [facility]) CM/SW [place], [place] Phone Number: [phone] 09/23/2019, 10:50 AM  Clinical Narrative:    CSW was alerted by Juliann Pulse at [facility] that patient's authorization has been received. CSW inquired about COVID test and was informed that patient needed updated COVID.  CSW alerted MD Sherral Hammers of discharge status and the need for a new COVID.  TOC team will continue to follow for discharge planning.   Expected Discharge Plan: [place] Barriers to Discharge: Continued Medical Work up  Expected Discharge Plan and Services Expected Discharge Plan: [place] Choice: [place] arrangements for the past 2 months: Single Family Home                                       Social Determinants of Health (SDOH) Interventions    Readmission Risk Interventions No flowsheet data found.

## 2019-09-23 NOTE — Consult Note (Signed)
ORTHOPAEDIC CONSULTATION  REQUESTING PHYSICIAN: Allie Bossier, MD  Chief Complaint: left shoulder pain  HPI: Dawes Umana. is a 86 y.o. male who presented to ED on 4/27 after fall around 1 am. He stated he fell on his left side. He complained of left hip and shoulder pain worse with any movement. CT pelvis was performed showing non-displaced fracture through left obturator ring and acetabulum. Left shoulder x-rays came back negative other than mild degenerative changes of AC and GH joints. Patient attempted PT and was found to have significant pain with any movement at left shoulder. MRI was performed and patient was found to have multiple tears of his rotator cuff. Patient states he is in constant moderate pain in his left shoulder and unable to move it at all. Pain is better with rest.   Past Medical History:  Diagnosis Date  . A-fib ([place])   . AAA (abdominal aortic aneurysm) ([place])   . CHF (congestive heart failure) ([place])   . COPD (chronic obstructive pulmonary disease) ([place])    Past Surgical History:  Procedure Laterality Date  . ABDOMINAL AORTIC ANEURYSM REPAIR    . EYE SURGERY     Social History   Socioeconomic History  . Marital status: Married    Spouse name: Not on file  . Number of children: Not on file  . Years of education: Not on file  . Highest education level: Not on file  Occupational History  . Not on file  Tobacco Use  . Smoking status: Former Smoker  Substance and Sexual Activity  . Alcohol use: Not on file  . Drug use: Not on file  . Sexual activity: Not on file  Other Topics Concern  . Not on file  Social History Narrative  . Not on file   Social Determinants of Health   Financial Resource Strain:   . Difficulty of Paying Living Expenses:   Food Insecurity:   . Worried About Charity fundraiser in the Last Year:   . Arboriculturist in the Last Year:   Transportation Needs:   . Film/video editor (Medical):   Marland Kitchen Lack of Transportation  (Non-Medical):   Physical Activity:   . Days of Exercise per Week:   . Minutes of Exercise per Session:   Stress:   . Feeling of Stress :   Social Connections:   . Frequency of Communication with Friends and Family:   . Frequency of Social Gatherings with Friends and Family:   . Attends Religious Services:   . Active Member of Clubs or Organizations:   . Attends Archivist Meetings:   Marland Kitchen Marital Status:    History reviewed. No pertinent family history. Allergies  Allergen Reactions  . Oxycodone   . Tramadol      Positive ROS: All other systems have been reviewed and were otherwise negative with the exception of those mentioned in the HPI and as above.  Physical Exam: General: Alert, no acute distress Cardiovascular: No pedal edema Respiratory: No cyanosis, no use of accessory musculature GI: No organomegaly, abdomen is soft and non-tender Skin: No lesions in the area of chief complaint Neurologic: Sensation intact distally Psychiatric: Patient is competent for consent with normal mood and affect Lymphatic: No axillary or cervical lymphadenopathy  MUSCULOSKELETAL:  LUE: patient has no ability to flex at left shoulder, can perform about 10 degrees of PROM until to painful to continue. Active external rotation 20 degrees.  LLE: Mild TTP to left  hip. No pain with movement at left knee. EHL and FHL intact. Distal sensation intact and capillary refill intact.   Assessment/Plan: Principal Problem:   Left acetabular fracture (HCC) Active Problems:   Fall   AKI (acute kidney injury) ([place])   Chronic atrial fibrillation ([place])   Acute respiratory failure ([place])   Internal bleeding   DNR (do not resuscitate)   Pubic ramus fracture (HCC)  Left acetabular fracture - TDWB LLE, plan to follow-up with Dr. Marcelino Scot outpatient 2-3 weeks after discharge  Left shoulder massive acute on chronic rotator cuff tear, already with rotator cuff arthropathy - MRI showing complete  full-thickness tear of supraspinatus and infraspinatus, focal full-thickness tear of subscapularis, medial subluxation of long head of biceps, with moderate glenohumeral arthritis - WBAT bilateral upper extremities - will give subacromial injection of depomedrol and marcaine today for symptom relief - will apply sling to be used only for comfort  This is a very difficult situation, given the fact that he has extremely limited function with his lower extremities, and now his left upper extremity also is very weak, and unable to bear weight without significant pain, unable to elevate.  There certainly is not enough tendon to consider surgical repair, and any surgical intervention would involve a reverse shoulder replacement, but under the circumstances, I think that this would be heroic, extremely high risk, and not likely to provide a significant change in his functional course.  Even that surgery carries at least a 4 to 6-week recovery in order to regain significant function.  Therefore, I would recommend a conservative course, starting with a subacromial injection, modification of physical therapy activities, limited expectation of about functional goals, and close monitoring.  Preprocedure Diagnosis: left shoulder rotator cuff arthropathy Post-procedure diagnosis: Same Procedure: left shoulder intraarticular injection  After informed verbal consent was obtained the posterior portal was prepped with chlorhexidine. 40mg  DepoMedrol and 2 cc lidocaine were injected into the subacromial space. Patient tolerated procedure well without complication.  We will continue to follow along with you, to assess his function, pain control, and see if anything further can be done.    Jola Baptist Cell [phone]   09/23/2019 1:57 PM   Patient discussed, reviewed images, agree with above, will continue to follow.  Johnny Bridge, MD 9:51 PM

## 2019-09-24 LAB — COMPREHENSIVE METABOLIC PANEL
ALT: 14 U/L (ref 0–44)
AST: 17 U/L (ref 15–41)
Albumin: 2.9 g/dL — ABNORMAL LOW (ref 3.5–5.0)
Alkaline Phosphatase: 53 U/L (ref 38–126)
Anion gap: 8 (ref 5–15)
BUN: 49 mg/dL — ABNORMAL HIGH (ref 8–23)
CO2: 25 mmol/L (ref 22–32)
Calcium: 9 mg/dL (ref 8.9–10.3)
Chloride: 105 mmol/L (ref 98–111)
Creatinine, Ser: 1.55 mg/dL — ABNORMAL HIGH (ref 0.61–1.24)
GFR calc Af Amer: 43 mL/min — ABNORMAL LOW (ref >60)
GFR calc non Af Amer: 37 mL/min — ABNORMAL LOW (ref >60)
Glucose, Bld: 120 mg/dL — ABNORMAL HIGH (ref 70–99)
Potassium: 4.9 mmol/L (ref 3.5–5.1)
Sodium: 138 mmol/L (ref 135–145)
Total Bilirubin: 1.3 mg/dL — ABNORMAL HIGH (ref 0.3–1.2)
Total Protein: 6.1 g/dL — ABNORMAL LOW (ref 6.5–8.1)

## 2019-09-24 LAB — CBC
HCT: 27.4 % — ABNORMAL LOW (ref 39.0–52.0)
Hemoglobin: 8.5 g/dL — ABNORMAL LOW (ref 13.0–17.0)
MCH: 28.6 pg (ref 26.0–34.0)
MCHC: 31 g/dL (ref 30.0–36.0)
MCV: 92.3 fL (ref 80.0–100.0)
Platelets: 142 10*3/uL — ABNORMAL LOW (ref 150–400)
RBC: 2.97 MIL/uL — ABNORMAL LOW (ref 4.22–5.81)
RDW: 14.6 % (ref 11.5–15.5)
WBC: 5.3 10*3/uL (ref 4.0–10.5)
nRBC: 0 % (ref 0.0–0.2)

## 2019-09-24 LAB — PROTIME-INR
INR: 1.6 — ABNORMAL HIGH (ref 0.8–1.2)
Prothrombin Time: 18.9 seconds — ABNORMAL HIGH (ref 11.4–15.2)

## 2019-09-24 LAB — PHOSPHORUS: Phosphorus: 3 mg/dL (ref 2.5–4.6)

## 2019-09-24 LAB — MAGNESIUM: Magnesium: 2.5 mg/dL — ABNORMAL HIGH (ref 1.7–2.4)

## 2019-09-24 NOTE — Progress Notes (Signed)
Orthopedic Tech Progress Note Patient Details:  Chad Buchanan 10/19/1920 [id number]  Ortho Devices Type of Ortho Device: Sling immobilizer Ortho Device/Splint Location: lue Ortho Device/Splint Interventions: Ordered, Application, Adjustment   Post Interventions Patient Tolerated: Well Instructions Provided: Care of device, Adjustment of device   Karolee Stamps 09/24/2019, 5:56 AM

## 2019-09-24 NOTE — Progress Notes (Signed)
PROGRESS NOTE    Chad Buchanan.  [id number] DOB: June 17, 1920 DOA: 09/19/2019 PCP: Crist Infante, MD     Brief Narrative:  86 y.o. male PMHx HTN, paroxysmal atrial fibrillation on Coumadin, chronic diastolic CHF last EF 50 to 55% in 2016, aortic aneurysm, and BPH   Presents after having a fall at home around 1 AM.  Patient reports that he is not exactly sure of how he fell, but thinks that his foot slipped causing him to fall landed on his left side in the kitchen.  He does not recall losing consciousness or hitting his head.  He had to call out to his grandson to help him get up and he was able to make it to his bedroom.  He complained of pain in his left hip and shoulder and was unable to get back up.  Pain was worsened with any kind of movement.  Associated symptoms include muscle spasms and shortness of breath due to.  At baseline he does not report being on home oxygen.  He is on blood thinners of Coumadin.  ED Course: Upon admission into the emergency department patient was noted to be afebrile with  O2 saturations noted as low as 83% with improvement on 2 L of nasal cannula oxygen.  CT scan of the brain showed no acute abnormalities.  Labs significant for hemoglobin 9.6, platelets 114, BUN 38, creatinine 1.73, glucose 140, and BNP 153.8.  Chest x-ray noted chronic diffuse interstitial lung disease unchanged.  CT imaging revealed a nondisplaced fracture of the left obturator ring and acetabulum along with signs of extraperitoneal hemorrhage.  COVID-19 and influenza screening were negative.  Orthopedics was consulted, but recommended no surgery..  Patient had been given fentanyl and albuterol inhaler.  TRH called to admit.  Subjective: 5/2 A/O x4, negative CP, negative S OB.  Left shoulder pain significantly improved.    Assessment & Plan:   Principal Problem:   Left acetabular fracture ([place]) Active Problems:   Fall   AKI (acute kidney injury) ([place])   Chronic atrial fibrillation  ([place])   Acute respiratory failure ([place])   Internal bleeding   DNR (do not resuscitate)   Pubic ramus fracture ([facility])   Left rotator cuff tear   Rotator cuff arthropathy of left shoulder   Acute LEFT Acetabular and left ramus fracture Secondary to fall:  -Orthopedics recommends TDWB LLE. F/u with Dr. Marcelino Scot in office in 2-3 weeks. -PT/OT recommend SNF.  Have spoken with family awaiting paperwork to go through. -Out of bed to chair q shift -Schedule follow-up with Dr. Marcelino Scot in 3 weeks ACUTE left acetabular/ramus fracture  LEFT rotator cuff tear -4/30 s/p fall patient with extreme pain when PT/OT attempted to work with patient today -LEFT SHOULDER EXAM; positive Hawkins test, positive external rotation test, positive Neer test, positive painful arc test. -5/1 MRI left shoulder positive for severe rotator cuff tear.  Discussed case with Dr. Mardelle Matte sports medicine who reviewed CT, will see patient this afternoon and attempt an injection first given that whole shoulder would need to be replaced.  Will await further recommendations. -Follow-up with Dr. Mardelle Matte sports medicine PRN shoulder pain.  Pain control -4/30 DC Fentanyl 12.5 mcg PRN.  Preparation for patient going to SNF -Robaxin 500 mg TID PRN -4/30 DC OxyContin 10 mg BID; per daughter has not required even when PT/OT worked with patient -4/30 Tylenol PRN -4/30 tramadol 50 mg BID  Acute respiratory failure with hypoxia -Titrate O2 to maintain SPO2> 92% -  Incentive spirometry  Extraperitoneal hemorrhage -stable as hemoglobin has remained stable.  Possible intracystic hemorrhage -Stable: Hemoglobin has remained stable  Macrocytic anemia -Anemia panel consistent with macrocytic anemia, low B12, low folate -B12 1000 mcg IM.---> B12 p.o. 50 mcg daily -Folate IV 1 mg daily Recent Labs  Lab 09/20/19 0553 09/21/19 0340 09/22/19 0504 09/23/19 0433 09/24/19 0602  HGB 8.2* 7.8* 7.8* 8.3* 8.5*  -Hemoglobin stable, continue to  follow closely  Acute kidney injury (baseline Cr 1.0)/Acute Urine Retention Recent Labs  Lab 09/20/19 0553 09/21/19 0340 09/22/19 0504 09/23/19 0433 09/24/19 0602  CREATININE 1.56* 1.59* 1.74* 1.55* 1.55*  -Per EMR patient having episodes of urine retention, bladder scan  QID -Orders placed for in and out cath -If patient continues to have episodes of Urine Retention Place, Foley catheter -Urinalysis nondiagnostic for cause of urinary retention -If urine retention continues obtain kidney US -Strict in and out +46.59ml -Daily weight Filed Weights   09/21/19 0134 09/22/19 0540 09/24/19 0313  Weight: 81.5 kg 82.8 kg 82.8 kg  -4/29 multiple bladder scans have shown negative retention.  Acute urinary retention resolved -Flomax 0.4 mg daily  Chronic atrial fibrillation -On chronic Coumadin (held) secondary to bleed Recent Labs  Lab 09/20/19 0553 09/21/19 0340 09/22/19 0504 09/23/19 0433 09/24/19 0602  INR 3.3* 2.8* 2.5* 2.2* 1.6*  -Therapeutic -4/29 spoke at length with daughter and wife and given how unsteady patient was prior to injury agreed that should not restart full strength anticoagulation. -4/29 ASA 324 mg daily when INR begins to normalize.  Thrombocytopenia (acute on chronic).  Baseline 114 -4/29 continuing to slowly trend down.  DIC secondary to trauma? -Currently no signs of overt bleeding -5/1 appears platelets have hit their nadir trending back up  Dysphagia -Family request that we liberalize diet to regular soft diet.  Given patient's age in comorbidities will comply.  Goals of care -4/28 PT/OT; evaluate for CIR vs SNF   DVT prophylaxis: SCD Code Status: DNR Family Communication:  Disposition Plan:  1.  Where the patient is from 2.  Anticipated d/c place.  3.  Barriers to d/c  Discharge on 5/3 to SNF   Consultants:  Dr. Mardelle Matte sports medicine   Procedures/Significant Events:  4/27 CT pelvis Wo contrast; -Nondisplaced fracture through the left  obturator ring and acetabulum. 2. Mild extraperitoneal hemorrhage in the left pelvis. 3. 6 cm partially covered high-density mass at the right flank where there was a renal cyst seen in 2012. This presumably reflects intracystic hemorrhage, but is incompletely visualized and solid mass is not excluded. Enhanced CT or renal ultrasound could be attempted if appropriate for comorbidities. 4/27 CT head W0 contrast; negative acute finding 4/29 MRI LEFT shoulder W0 contrast; -Complete full-thickness tears of the supraspinatus and infraspinatus tendons with approximately 3.3 cm of tendon retraction, resultant high-riding humeral head and fluid in the subacromial-subdeltoid bursa. 2. There is mild muscular atrophy of the supraspinatus and infraspinatus muscle bellies with diffuse muscular edema seen surrounding the shoulder. 3. Focal full-thickness tear of the superior subscapularis tendon with approximately 2 cm of tendon retraction. 4. Medial subluxation of the long head of the biceps tendon with a partial interstitial tear and a multilocular 1.2 cm ganglion cyst within the bicipital groove. 5. Moderate glenohumeral joint osteoarthritis with diffuse labral degeneration and probable nondisplaced tear of the posteroinferior labrum. 6. Moderate to advanced AC joint arthrosis.   I have personally reviewed and interpreted all radiology studies and my findings are as above.  VENTILATOR SETTINGS:  Cultures   Antimicrobials:    Devices    LINES / TUBES:      Continuous Infusions:   Objective: Vitals:   09/23/19 2039 09/24/19 0313 09/24/19 0558 09/24/19 1248  BP: 117/78  (!) 143/80 99/64  Pulse: 84  76 69  Resp: 20  20   Temp: 97.8 F (36.6 C)  97.8 F (36.6 C) 98.5 F (36.9 C)  TempSrc:   Oral Oral  SpO2: 92%  91% (!) 89%  Weight:  82.8 kg    Height:        Intake/Output Summary (Last 24 hours) at 09/24/2019 1810 Last data filed at 09/24/2019 0631 Gross per 24  hour  Intake 120 ml  Output 830 ml  Net -710 ml   Filed Weights   09/21/19 0134 09/22/19 0540 09/24/19 0313  Weight: 81.5 kg 82.8 kg 82.8 kg   Physical Exam:**  General: A/O x4, no acute respiratory distress Eyes: negative scleral hemorrhage, negative anisocoria, negative icterus ENT: Negative Runny nose, negative gingival bleeding, Neck:  Negative scars, masses, torticollis, lymphadenopathy, JVD Lungs: Clear to auscultation bilaterally without wheezes or crackles Cardiovascular: Regular rate and rhythm without murmur gallop or rub normal S1 and S2 Abdomen: negative abdominal pain, nondistended, positive soft, bowel sounds, no rebound, no ascites, no appreciable mass Extremities: LEFT SHOULDER EXAM; positive Hawkins test, positive external rotation test, positive Neer test, positive painful arc test.  Patient with improved movement after injection on 5/1 Skin: Negative rashes, lesions, ulcers Psychiatric:  Negative depression, negative anxiety, negative fatigue, negative mania  Central nervous system:  Cranial nerves II through XII intact, tongue/uvula midline, all extremities muscle strength 5/5, (unable to fully move left upper extremity secondary to pain and acute on chronic rotator cuff tear) sensation intact throughout, negative dysarthria, negative expressive aphasia, negative receptive aphasia.   .     Data Reviewed: Care during the described time interval was provided by me .  I have reviewed this patient's available data, including medical history, events of note, physical examination, and all test results as part of my evaluation.  CBC: Recent Labs  Lab 09/19/19 0606 09/19/19 0606 09/20/19 0553 09/21/19 0340 09/22/19 0504 09/23/19 0433 09/24/19 0602  WBC 8.2   < > 5.8 5.9 5.0 5.5 5.3  NEUTROABS 7.1  --   --   --   --   --   --   HGB 9.6*   < > 8.2* 7.8* 7.8* 8.3* 8.5*  HCT 32.1*   < > 26.8* 26.2* 25.4* 26.3* 27.4*  MCV 95.8   < > 94.7 94.9 94.8 92.9 92.3  PLT  114*   < > 109* 100* 105* 122* 142*   < > = values in this interval not displayed.   Basic Metabolic Panel: Recent Labs  Lab 09/20/19 0553 09/21/19 0340 09/22/19 0504 09/23/19 0433 09/24/19 0602  NA 138 138 135 136 138  K 4.6 4.4 4.6 4.7 4.9  CL 106 106 103 103 105  CO2 26 26 26 26 25   GLUCOSE 101* 100* 95 109* 120*  BUN 34* 37* 42* 47* 49*  CREATININE 1.56* 1.59* 1.74* 1.55* 1.55*  CALCIUM 8.7* 8.6* 8.4* 8.7* 9.0  MG  --  2.3 2.3 2.4 2.5*  PHOS  --  3.0 3.1 3.2 3.0   GFR: Estimated Creatinine Clearance: 30.6 mL/min (A) (by C-G formula based on SCr of 1.55 mg/dL (H)). Liver Function Tests: Recent Labs  Lab 09/21/19 0340 09/22/19 0504 09/23/19 0433 09/24/19 0602  AST 15 17  16 17  ALT 14 13 14 14   ALKPHOS 46 45 48 53  BILITOT 1.1 0.9 1.2 1.3*  PROT 5.3* 5.2* 5.6* 6.1*  ALBUMIN 2.8* 2.7* 2.8* 2.9*   No results for input(s): LIPASE, AMYLASE in the last 168 hours. No results for input(s): AMMONIA in the last 168 hours. Coagulation Profile: Recent Labs  Lab 09/20/19 0553 09/21/19 0340 09/22/19 0504 09/23/19 0433 09/24/19 0602  INR 3.3* 2.8* 2.5* 2.2* 1.6*   Cardiac Enzymes: No results for input(s): CKTOTAL, CKMB, CKMBINDEX, TROPONINI in the last 168 hours. BNP (last 3 results) No results for input(s): PROBNP in the last 8760 hours. HbA1C: No results for input(s): HGBA1C in the last 72 hours. CBG: No results for input(s): GLUCAP in the last 168 hours. Lipid Profile: No results for input(s): CHOL, HDL, LDLCALC, TRIG, CHOLHDL, LDLDIRECT in the last 72 hours. Thyroid Function Tests: No results for input(s): TSH, T4TOTAL, FREET4, T3FREE, THYROIDAB in the last 72 hours. Anemia Panel: No results for input(s): VITAMINB12, FOLATE, FERRITIN, TIBC, IRON, RETICCTPCT in the last 72 hours. Sepsis Labs: No results for input(s): PROCALCITON, LATICACIDVEN in the last 168 hours.  Recent Results (from the past 240 hour(s))  Respiratory Panel by RT PCR (Flu A&B, Covid) -  Nasopharyngeal Swab     Status: None   Collection Time: 09/19/19 11:35 AM   Specimen: Nasopharyngeal Swab  Result Value Ref Range Status   SARS Coronavirus 2 by RT PCR NEGATIVE NEGATIVE Final    Comment: (NOTE) SARS-CoV-2 target nucleic acids are NOT DETECTED. The SARS-CoV-2 RNA is generally detectable in upper respiratoy specimens during the acute phase of infection. The lowest concentration of SARS-CoV-2 viral copies this assay can detect is 131 copies/mL. A negative result does not preclude SARS-Cov-2 infection and should not be used as the sole basis for treatment or other patient management decisions. A negative result may occur with  improper specimen collection/handling, submission of specimen other than nasopharyngeal swab, presence of viral mutation(s) within the areas targeted by this assay, and inadequate number of viral copies (<131 copies/mL). A negative result must be combined with clinical observations, patient history, and epidemiological information. The expected result is Negative. Fact Sheet for Patients:  PinkCheek.be Fact Sheet for Healthcare Providers:  GravelBags.it This test is not yet ap proved or cleared by the [place] FDA and  has been authorized for detection and/or diagnosis of SARS-CoV-2 by FDA under an Emergency Use Authorization (EUA). This EUA will remain  in effect (meaning this test can be used) for the duration of the COVID-19 declaration under Section 564(b)(1) of the Act, 21 U.S.C. section 360bbb-3(b)(1), unless the authorization is terminated or revoked sooner.    Influenza A by PCR NEGATIVE NEGATIVE Final   Influenza B by PCR NEGATIVE NEGATIVE Final    Comment: (NOTE) The Xpert Xpress SARS-CoV-2/FLU/RSV assay is intended as an aid in  the diagnosis of influenza from Nasopharyngeal swab specimens and  should not be used as a sole basis for treatment. Nasal washings and  aspirates  are unacceptable for Xpert Xpress SARS-CoV-2/FLU/RSV  testing. Fact Sheet for Patients: PinkCheek.be Fact Sheet for Healthcare Providers: GravelBags.it This test is not yet approved or cleared by the [place] FDA and  has been authorized for detection and/or diagnosis of SARS-CoV-2 by  FDA under an Emergency Use Authorization (EUA). This EUA will remain  in effect (meaning this test can be used) for the duration of the  Covid-19 declaration under Section 564(b)(1) of the Act, 21  U.S.C.  section 360bbb-3(b)(1), unless the authorization is  terminated or revoked. Performed at [facility], [place] [address] [place], [place] [address]   SARS CORONAVIRUS 2 (TAT 6-24 HRS) Nasopharyngeal Nasopharyngeal Swab     Status: None   Collection Time: 09/23/19  2:30 PM   Specimen: Nasopharyngeal Swab  Result Value Ref Range Status   SARS Coronavirus 2 NEGATIVE NEGATIVE Final    Comment: (NOTE) SARS-CoV-2 target nucleic acids are NOT DETECTED. The SARS-CoV-2 RNA is generally detectable in upper and lower respiratory specimens during the acute phase of infection. Negative results do not preclude SARS-CoV-2 infection, do not rule out co-infections with other pathogens, and should not be used as the sole basis for treatment or other patient management decisions. Negative results must be combined with clinical observations, patient history, and epidemiological information. The expected result is Negative. Fact Sheet for Patients: SugarRoll.be Fact Sheet for Healthcare Providers: https://www.Rushil Kimbrell-mathews.com/ This test is not yet approved or cleared by the [place] FDA and  has been authorized for detection and/or diagnosis of SARS-CoV-2 by FDA under an Emergency Use Authorization (EUA). This EUA will remain  in effect (meaning this test can be used) for the duration of the COVID-19  declaration under Section 56 4(b)(1) of the Act, 21 U.S.C. section 360bbb-3(b)(1), unless the authorization is terminated or revoked sooner. Performed at [facility], [place] [address], [place], [place] [address]          Radiology Studies: No results found.      Scheduled Meds: . diclofenac Sodium  2 g Topical QID  . folic acid  1 mg Oral Daily  . polyethylene glycol  17 g Oral Daily  . sodium chloride flush  3 mL Intravenous Q12H  . tamsulosin  0.4 mg Oral Daily  . traMADol  50 mg Oral BID  . vitamin B-12  50 mcg Oral Daily   Continuous Infusions:   LOS: 5 days    Time spent:40 min    Jamaurion Slemmer, Geraldo Docker, MD [facility] Pager [phone]  If 7PM-7AM, please contact night-coverage [email or web address] Password [email or web address] 09/24/2019, 6:10 PM

## 2019-09-25 DIAGNOSIS — Z7401 Bed confinement status: Secondary | ICD-10-CM | POA: Diagnosis not present

## 2019-09-25 DIAGNOSIS — M6281 Muscle weakness (generalized): Secondary | ICD-10-CM | POA: Diagnosis not present

## 2019-09-25 DIAGNOSIS — I509 Heart failure, unspecified: Secondary | ICD-10-CM | POA: Diagnosis not present

## 2019-09-25 DIAGNOSIS — R5383 Other fatigue: Secondary | ICD-10-CM | POA: Diagnosis not present

## 2019-09-25 DIAGNOSIS — R58 Hemorrhage, not elsewhere classified: Secondary | ICD-10-CM | POA: Diagnosis not present

## 2019-09-25 DIAGNOSIS — I482 Chronic atrial fibrillation, unspecified: Secondary | ICD-10-CM | POA: Diagnosis not present

## 2019-09-25 DIAGNOSIS — S72002D Fracture of unspecified part of neck of left femur, subsequent encounter for closed fracture with routine healing: Secondary | ICD-10-CM | POA: Diagnosis not present

## 2019-09-25 DIAGNOSIS — M25512 Pain in left shoulder: Secondary | ICD-10-CM | POA: Diagnosis not present

## 2019-09-25 DIAGNOSIS — R0602 Shortness of breath: Secondary | ICD-10-CM | POA: Diagnosis not present

## 2019-09-25 DIAGNOSIS — R5381 Other malaise: Secondary | ICD-10-CM | POA: Diagnosis not present

## 2019-09-25 DIAGNOSIS — S46022D Laceration of muscle(s) and tendon(s) of the rotator cuff of left shoulder, subsequent encounter: Secondary | ICD-10-CM | POA: Diagnosis not present

## 2019-09-25 DIAGNOSIS — M12812 Other specific arthropathies, not elsewhere classified, left shoulder: Secondary | ICD-10-CM | POA: Diagnosis not present

## 2019-09-25 DIAGNOSIS — D539 Nutritional anemia, unspecified: Secondary | ICD-10-CM | POA: Diagnosis not present

## 2019-09-25 DIAGNOSIS — I10 Essential (primary) hypertension: Secondary | ICD-10-CM | POA: Diagnosis not present

## 2019-09-25 DIAGNOSIS — I5032 Chronic diastolic (congestive) heart failure: Secondary | ICD-10-CM | POA: Diagnosis not present

## 2019-09-25 DIAGNOSIS — R262 Difficulty in walking, not elsewhere classified: Secondary | ICD-10-CM | POA: Diagnosis not present

## 2019-09-25 DIAGNOSIS — G8929 Other chronic pain: Secondary | ICD-10-CM | POA: Diagnosis not present

## 2019-09-25 DIAGNOSIS — W19XXXA Unspecified fall, initial encounter: Secondary | ICD-10-CM | POA: Diagnosis not present

## 2019-09-25 DIAGNOSIS — J449 Chronic obstructive pulmonary disease, unspecified: Secondary | ICD-10-CM | POA: Diagnosis not present

## 2019-09-25 DIAGNOSIS — D649 Anemia, unspecified: Secondary | ICD-10-CM | POA: Diagnosis not present

## 2019-09-25 DIAGNOSIS — R1312 Dysphagia, oropharyngeal phase: Secondary | ICD-10-CM | POA: Diagnosis not present

## 2019-09-25 DIAGNOSIS — E860 Dehydration: Secondary | ICD-10-CM | POA: Diagnosis not present

## 2019-09-25 DIAGNOSIS — W19XXXD Unspecified fall, subsequent encounter: Secondary | ICD-10-CM | POA: Diagnosis not present

## 2019-09-25 DIAGNOSIS — S3289XA Fracture of other parts of pelvis, initial encounter for closed fracture: Secondary | ICD-10-CM | POA: Diagnosis not present

## 2019-09-25 DIAGNOSIS — M2550 Pain in unspecified joint: Secondary | ICD-10-CM | POA: Diagnosis not present

## 2019-09-25 DIAGNOSIS — J9601 Acute respiratory failure with hypoxia: Secondary | ICD-10-CM | POA: Diagnosis not present

## 2019-09-25 DIAGNOSIS — N179 Acute kidney failure, unspecified: Secondary | ICD-10-CM | POA: Diagnosis not present

## 2019-09-25 DIAGNOSIS — S32402D Unspecified fracture of left acetabulum, subsequent encounter for fracture with routine healing: Secondary | ICD-10-CM | POA: Diagnosis not present

## 2019-09-25 DIAGNOSIS — R05 Cough: Secondary | ICD-10-CM | POA: Diagnosis not present

## 2019-09-25 DIAGNOSIS — M75122 Complete rotator cuff tear or rupture of left shoulder, not specified as traumatic: Secondary | ICD-10-CM

## 2019-09-25 DIAGNOSIS — M25552 Pain in left hip: Secondary | ICD-10-CM | POA: Diagnosis not present

## 2019-09-25 DIAGNOSIS — S32591S Other specified fracture of right pubis, sequela: Secondary | ICD-10-CM

## 2019-09-25 DIAGNOSIS — K5900 Constipation, unspecified: Secondary | ICD-10-CM | POA: Diagnosis not present

## 2019-09-25 DIAGNOSIS — M62838 Other muscle spasm: Secondary | ICD-10-CM | POA: Diagnosis not present

## 2019-09-25 DIAGNOSIS — S32502D Unspecified fracture of left pubis, subsequent encounter for fracture with routine healing: Secondary | ICD-10-CM | POA: Diagnosis not present

## 2019-09-25 DIAGNOSIS — S329XXA Fracture of unspecified parts of lumbosacral spine and pelvis, initial encounter for closed fracture: Secondary | ICD-10-CM | POA: Diagnosis not present

## 2019-09-25 DIAGNOSIS — I4821 Permanent atrial fibrillation: Secondary | ICD-10-CM | POA: Diagnosis not present

## 2019-09-25 LAB — COMPREHENSIVE METABOLIC PANEL
ALT: 13 U/L (ref 0–44)
AST: 14 U/L — ABNORMAL LOW (ref 15–41)
Albumin: 3 g/dL — ABNORMAL LOW (ref 3.5–5.0)
Alkaline Phosphatase: 50 U/L (ref 38–126)
Anion gap: 7 (ref 5–15)
BUN: 45 mg/dL — ABNORMAL HIGH (ref 8–23)
CO2: 26 mmol/L (ref 22–32)
Calcium: 9.1 mg/dL (ref 8.9–10.3)
Chloride: 107 mmol/L (ref 98–111)
Creatinine, Ser: 1.34 mg/dL — ABNORMAL HIGH (ref 0.61–1.24)
GFR calc Af Amer: 51 mL/min — ABNORMAL LOW (ref >60)
GFR calc non Af Amer: 44 mL/min — ABNORMAL LOW (ref >60)
Glucose, Bld: 108 mg/dL — ABNORMAL HIGH (ref 70–99)
Potassium: 4.7 mmol/L (ref 3.5–5.1)
Sodium: 140 mmol/L (ref 135–145)
Total Bilirubin: 1.1 mg/dL (ref 0.3–1.2)
Total Protein: 5.7 g/dL — ABNORMAL LOW (ref 6.5–8.1)

## 2019-09-25 LAB — CBC
HCT: 27.8 % — ABNORMAL LOW (ref 39.0–52.0)
Hemoglobin: 8.4 g/dL — ABNORMAL LOW (ref 13.0–17.0)
MCH: 28.9 pg (ref 26.0–34.0)
MCHC: 30.2 g/dL (ref 30.0–36.0)
MCV: 95.5 fL (ref 80.0–100.0)
Platelets: 188 10*3/uL (ref 150–400)
RBC: 2.91 MIL/uL — ABNORMAL LOW (ref 4.22–5.81)
RDW: 14.6 % (ref 11.5–15.5)
WBC: 5.6 10*3/uL (ref 4.0–10.5)
nRBC: 0 % (ref 0.0–0.2)

## 2019-09-25 LAB — PROTIME-INR
INR: 1.4 — ABNORMAL HIGH (ref 0.8–1.2)
Prothrombin Time: 16.3 seconds — ABNORMAL HIGH (ref 11.4–15.2)

## 2019-09-25 LAB — PHOSPHORUS: Phosphorus: 2.9 mg/dL (ref 2.5–4.6)

## 2019-09-25 LAB — MAGNESIUM: Magnesium: 2.5 mg/dL — ABNORMAL HIGH (ref 1.7–2.4)

## 2019-09-25 MED ORDER — POLYETHYLENE GLYCOL 3350 17 G PO PACK: 17.0000 g | PACK | Freq: Every day | ORAL | 0 refills | Status: AC

## 2019-09-25 MED ORDER — ASPIRIN 325 MG PO TABS: 325.0000 mg | ORAL_TABLET | Freq: Every day | ORAL | Status: DC

## 2019-09-25 MED ORDER — ASPIRIN 325 MG PO TABS: 325.0000 mg | ORAL_TABLET | Freq: Every day | ORAL | 0 refills | Status: AC

## 2019-09-25 MED ORDER — TAMSULOSIN HCL 0.4 MG PO CAPS: 0.4000 mg | ORAL_CAPSULE | Freq: Every day | ORAL | 0 refills | Status: AC

## 2019-09-25 MED ORDER — ACETAMINOPHEN 325 MG PO TABS: 650.0000 mg | ORAL_TABLET | Freq: Four times a day (QID) | ORAL | 0 refills | Status: AC | PRN

## 2019-09-25 MED ORDER — TRAMADOL HCL 50 MG PO TABS: 50.0000 mg | ORAL_TABLET | Freq: Two times a day (BID) | ORAL | 0 refills | Status: AC

## 2019-09-25 MED ORDER — DICLOFENAC SODIUM 1 % EX GEL: 2.0000 g | Freq: Four times a day (QID) | CUTANEOUS | 0 refills | Status: AC

## 2019-09-25 MED ORDER — CYANOCOBALAMIN 50 MCG PO TABS: 50.0000 ug | ORAL_TABLET | Freq: Every day | ORAL | 0 refills | Status: AC

## 2019-09-25 MED ORDER — METHOCARBAMOL 500 MG PO TABS: 500.0000 mg | ORAL_TABLET | Freq: Three times a day (TID) | ORAL | 0 refills | Status: AC | PRN

## 2019-09-25 MED ORDER — FOLIC ACID 1 MG PO TABS: 1.0000 mg | ORAL_TABLET | Freq: Every day | ORAL | 0 refills | Status: AC

## 2019-09-25 NOTE — TOC Transition Note (Addendum)
Transition of Care [facility]) - CM/SW Discharge Note   Patient Details  Name: Chad Buchanan. MRN: [id number] Date of Birth: [birth year]  Transition of [facility]) CM/SW Contact:  Alberteen Sam, LCSW Phone Number: 09/25/2019, 9:04 AM   Clinical Narrative:     Patient will DC to: [facility] date: 09/25/19 Family notified: Joellen [facility] [id number]  Per MD patient ready for DC to [facility] . RN, patient, patient's family, and facility notified of DC. Discharge Summary sent to facility. RN given number for report  [phone] . DC packet on chart. Ambulance transport requested for patient for 10:00 am.  CSW signing off.  [place]   Final next level of care: Skilled Nursing Facility Barriers to Discharge: No Barriers Identified   Patient Goals and CMS Choice Patient states their goals for this hospitalization and ongoing recovery are:: to go to rehab then go home CMS Medicare.gov Compare Post Acute Care list provided to:: Patient Represenative (must comment)(daughter Katie) Choice offered to / list presented to : Adult Children  Discharge Placement PASRR number recieved: 09/21/19            Patient chooses bed at: [facility] Patient to be transferred to facility by: [place] Name of family member notified: Katie Patient and family notified of of transfer: 09/25/19  Discharge Plan and Services     Post Acute Care Choice: [facility]                               Social Determinants of Health (SDOH) Interventions     Readmission Risk Interventions No flowsheet data found.

## 2019-09-25 NOTE — Progress Notes (Signed)
Subjective:   Patient is alert, laying comfortably in bed. States he is in no pain this morning because he has been resting. Does not know if shoulder injection helped his pain or not. Denies chest pain, shortness of breath, nausea or vomiting. No other complaints.   Objective:  PE: VITALS:   Vitals:   09/24/19 1248 09/24/19 2049 09/24/19 2200 09/25/19 0508  BP: 99/64 110/89  (!) 150/68  Pulse: 69 70  79  Resp:  20 20 20   Temp: 98.5 F (36.9 C) 98.1 F (36.7 C)  98 F (36.7 C)  TempSrc: Oral     SpO2: (!) 89% 90% 94% 98%  Weight:    82.2 kg  Height:       General: Alert and oriented, no acute distress Respiratory: no use of accessory musculature GI: No organomegaly, abdomen is soft and non-tender MSK: LUE: 0-5 degrees of forward flexion of left shoulder with pain. tolerates about 15 degrees of PROM. Active external rotation 20 degrees. significant ecchymosis at left elbow. No TTP over [facility] joint or anywhere on shoulder. Distal sensation intact. 2+ radial pulse.  LLE: No TTP to left hip. No pain with movement at left knee. EHL and FHL intact. Distal sensation intact and capillary refill intact.    LABS  Results for orders placed or performed during the hospital encounter of 09/19/19 (from the past 24 hour(s))  Protime-INR     Status: Abnormal   Collection Time: 09/25/19  6:14 AM  Result Value Ref Range   Prothrombin Time 16.3 (H) 11.4 - 15.2 seconds   INR 1.4 (H) 0.8 - 1.2  Comprehensive metabolic panel     Status: Abnormal   Collection Time: 09/25/19  6:14 AM  Result Value Ref Range   Sodium 140 135 - 145 mmol/L   Potassium 4.7 3.5 - 5.1 mmol/L   Chloride 107 98 - 111 mmol/L   CO2 26 22 - 32 mmol/L   Glucose, Bld 108 (H) 70 - 99 mg/dL   BUN 45 (H) 8 - 23 mg/dL   Creatinine, Ser 1.34 (H) 0.61 - 1.24 mg/dL   Calcium 9.1 8.9 - 10.3 mg/dL   Total Protein 5.7 (L) 6.5 - 8.1 g/dL   Albumin 3.0 (L) 3.5 - 5.0 g/dL   AST 14 (L) 15 - 41 U/L   ALT 13 0 - 44 U/L   Alkaline Phosphatase 50 38 - 126 U/L   Total Bilirubin 1.1 0.3 - 1.2 mg/dL   GFR calc non Af Amer 44 (L) >60 mL/min   GFR calc Af Amer 51 (L) >60 mL/min   Anion gap 7 5 - 15  Magnesium     Status: Abnormal   Collection Time: 09/25/19  6:14 AM  Result Value Ref Range   Magnesium 2.5 (H) 1.7 - 2.4 mg/dL  Phosphorus     Status: None   Collection Time: 09/25/19  6:14 AM  Result Value Ref Range   Phosphorus 2.9 2.5 - 4.6 mg/dL  CBC     Status: Abnormal   Collection Time: 09/25/19  6:14 AM  Result Value Ref Range   WBC 5.6 4.0 - 10.5 K/uL   RBC 2.91 (L) 4.22 - 5.81 MIL/uL   Hemoglobin 8.4 (L) 13.0 - 17.0 g/dL   HCT 27.8 (L) 39.0 - 52.0 %   MCV 95.5 80.0 - 100.0 fL   MCH 28.9 26.0 - 34.0 pg   MCHC 30.2 30.0 - 36.0 g/dL   RDW 14.6 11.5 -  15.5 %   Platelets 188 150 - 400 K/uL   nRBC 0.0 0.0 - 0.2 %    No results found.  Assessment/Plan: Principal Problem:   Left acetabular fracture ([place]) Active Problems:   Fall   AKI (acute kidney injury) ([place])   Chronic atrial fibrillation ([facility])   Acute respiratory failure ([place])   Internal bleeding   DNR (do not resuscitate)   Pubic ramus fracture ([facility])   Left rotator cuff tear   Rotator cuff arthropathy of left shoulder  Left acetabular fracture - TDWB LLE, plan to follow-up with Dr. Marcelino Scot outpatient 2-3 weeks after discharge  Left shoulder massive acute on chronic rotator cuff tear, already with rotator cuff arthropathy - WBAT bilateral upper extremities - subacromial injection given 5/1, slight improvement in ROM, patient does not know if injection is helping pain or not. This injection can take up to 2 weeks for maximal improvement, so I hope to see more pain relief in the near future.  - sling can be used only for comfort - not surgical candidate due to age and other comorbidities  - will continue conservative course, patient will continue PT at SNF, can follow up with Dr. Mardelle Matte outpatient - ok for discharge home today from  orthopedics standpoint   Contact information:   Weekdays 8-5 Merlene Pulling, PA-C [phone] A fter hours and holidays please check [email or web address] for group call information for [facility] [place] 09/25/2019, 8:08 AM

## 2019-09-25 NOTE — Care Management Important Message (Signed)
Important Message  Patient Details  Name: Chad Buchanan. MRN: [id number] Date of Birth: [birth year]   Medicare Important Message Given:  Yes     Shelda Altes 09/25/2019, 10:23 AM

## 2019-09-25 NOTE — Progress Notes (Signed)
Tried to call report to [facility] but nobody's picking up the phone.

## 2019-09-25 NOTE — Discharge Summary (Signed)
Physician Discharge Summary  Chad Buchanan. UF:[id number] DOB: 10-06-1920 DOA: 09/19/2019  PCP: Crist Infante, MD  Admit date: 09/19/2019 Discharge date: 09/25/2019  Time spent:30 minutes  Recommendations for Outpatient Follow-up:   Acute LEFT Acetabular and left ramus fracture Secondary tofall: -Orthopedics recommends [facility] LLE. F/u with Dr. Marcelino Scot in office in 2-3 weeks. -PT/OT recommend SNF.  Have spoken with family awaiting paperwork to go through. -Out of bed to chair q shift -Schedule follow-up with Dr. Marcelino Scot in 3 weeks ACUTE left acetabular/ramus fracture  LEFT rotator cuff tear -4/30 s/p fall patient with extreme pain when PT/OT attempted to work with patient today -LEFT SHOULDER EXAM; positive Hawkins test, positive external rotation test, positive Neer test, positive painful arc test. -5/1 MRI left shoulder positive for severe rotator cuff tear.  Discussed case with Dr. Mardelle Matte sports medicine who reviewed CT, will see patient this afternoon and attempt an injection first given that whole shoulder would need to be replaced.   -5/1 s/p LEFT shoulder injection with some relief of pain. -Follow-up with Dr. Mardelle Matte sports medicine PRN shoulder pain.  Pain control -Robaxin 500 mg TID PRN -4/30 Tylenol PRN -4/30 tramadol 50 mg BID  Acute respiratory failure with hypoxia -Titrate O2 to maintain SPO2> 92% -Incentive spirometry  Extraperitoneal hemorrhage -stable as hemoglobin has remained stable.  Possible intracystic hemorrhage -Stable: Hemoglobin has remained stable  Macrocytic anemia -Anemia panel consistent with macrocytic anemia, low B12, low folate -B12 1000 mcg IM.---> B12 p.o. 50 mcg daily -Folate IV 1 mg daily Recent Labs  Lab 09/21/19 0340 09/22/19 0504 09/23/19 0433 09/24/19 0602 09/25/19 0614  HGB 7.8* 7.8* 8.3* 8.5* 8.4*  -Hemoglobin stable, continue to follow closely  Acute kidney injury (baseline Cr 1.0)/Acute Urine Retention Recent Labs   Lab 09/21/19 0340 09/22/19 0504 09/23/19 0433 09/24/19 0602 09/25/19 0614  CREATININE 1.59* 1.74* 1.55* 1.55* 1.34*  -Strict in and out  +886.2 ml -Daily weight Filed Weights   09/22/19 0540 09/24/19 0313 09/25/19 0508  Weight: 82.8 kg 82.8 kg 82.2 kg  -4/29 multiple bladder scans have shown negative retention.  Acute urinary retention resolved -Finasteride 5 mg daily -Flomax 0.4 mg daily  Chronic atrial fibrillation -On chronic Coumadin (held) secondary to bleed Recent Labs  Lab 09/21/19 0340 09/22/19 0504 09/23/19 0433 09/24/19 0602 09/25/19 0614  INR 2.8* 2.5* 2.2* 1.6* 1.4*  -4/29 spoke at length with daughter and wife and given how unsteady patient was prior to injury agreed that should not restart full strength anticoagulation. -4/29 ASA 325 mg daily when INR begins to normalize. -ASA 325 mg daily  Thrombocytopenia (acute on chronic).  Baseline 114 -4/29 continuing to slowly trend down.  DIC secondary to trauma? -Currently no signs of overt bleeding -5/1 appears platelets have hit their nadir trending back up  Dysphagia -Family request that we liberalize diet to regular soft diet.  Given patient's age and comorbidities will comply.   Discharge Diagnoses:  Principal Problem:   Left acetabular fracture [facility]) Active Problems:   Fall   AKI (acute kidney injury) ([place])   Chronic atrial fibrillation ([place])   Acute respiratory failure ([place])   Internal bleeding   DNR (do not resuscitate)   Pubic ramus fracture ([facility])   Left rotator cuff tear   Rotator cuff arthropathy of left shoulder   Discharge Condition: Guarded  Diet recommendation: Soft diet fluid consistency thin  Filed Weights   09/22/19 0540 09/24/19 0313 09/25/19 0508  Weight: 82.8 kg 82.8 kg 82.2 kg  History of present illness:  86 y.o.malePMHx HTN, paroxysmal atrial fibrillation on Coumadin, chronic diastolic CHF last EF 50 to 55% in 2016, aortic aneurysm, and BPH   Presents after  having a fall at home around 1 AM. Patient reports that he is not exactly sure of how he fell, but thinks that his foot slipped causing him to fall landed on his left side in the kitchen. He does not recall losing consciousness or hitting his head. He had to call out to his grandson to help him get up and he was able to make it to his bedroom. He complained of pain in his left hip and shoulder and was unable to get back up. Pain was worsened with any kind of movement. Associated symptoms include muscle spasms and shortness of breath due to. At baseline he does not report being on home oxygen. He is on blood thinners of Coumadin.  ED Course:Upon admission into the emergency department patient was noted to be afebrile with O2 saturations noted as low as 83% with improvement on 2 L of nasal cannula oxygen. CT scan of the brain showed no acute abnormalities. Labs significant for hemoglobin 9.6, platelets 114, BUN 38, creatinine 1.73, glucose 140, and BNP 153.8. Chest x-ray noted chronic diffuse interstitial lung disease unchanged. CT imaging revealed a nondisplaced fracture of the left obturator ring and acetabulumalong with signs of extraperitoneal hemorrhage.COVID-19 and influenza screening were negative.Orthopedics was consulted, but recommended no surgery.. Patient had been given fentanyl and albuterol inhaler. [facility] called to admit.  Hospital Course:  See above  Procedures: 4/27 CT pelvis Wo contrast; -Nondisplaced fracture through the left obturator ring and acetabulum. 2. Mild extraperitoneal hemorrhage in the left pelvis. 3. 6 cm partially covered high-density mass at the right flank where there was a renal cyst seen in 2012. This presumably reflects intracystic hemorrhage, but is incompletely visualized and solid mass is not excluded. Enhanced CT or renal ultrasound could be attempted if appropriate for comorbidities. 4/27 CT head W0 contrast; negative acute finding 4/29  MRI LEFT shoulder W0 contrast; -Complete full-thickness tears of the supraspinatus and infraspinatus tendons with approximately 3.3 cm of tendon retraction, resultant high-riding humeral head and fluid in the subacromial-subdeltoid bursa. 2. There is mild muscular atrophy of the supraspinatus and infraspinatus muscle bellies with diffuse muscular edema seen surrounding the shoulder. 3. Focal full-thickness tear of the superior subscapularis tendon with approximately 2 cm of tendon retraction. 4. Medial subluxation of the long head of the biceps tendon with a partial interstitial tear and a multilocular 1.2 cm ganglion cyst within the bicipital groove. 5. Moderate glenohumeral joint osteoarthritis with diffuse labral degeneration and probable nondisplaced tear of the posteroinferior labrum. 6. Moderate to advanced AC joint arthrosis.  Consultations: Dr. Mardelle Matte sports medicine Dr. Altamese Skyline orthopedic surgery    Discharge Exam: Vitals:   09/24/19 1248 09/24/19 2049 09/24/19 2200 09/25/19 0508  BP: 99/64 110/89  (!) 150/68  Pulse: 69 70  79  Resp:  20 20 20   Temp: 98.5 F (36.9 C) 98.1 F (36.7 C)  98 F (36.7 C)  TempSrc: Oral     SpO2: (!) 89% 90% 94% 98%  Weight:    82.2 kg  Height:        General: A/O x4, no acute respiratory distress Eyes: negative scleral hemorrhage, negative anisocoria, negative icterus ENT: Negative Runny nose, negative gingival bleeding, Neck:  Negative scars, masses, torticollis, lymphadenopathy, JVD Lungs: Clear to auscultation bilaterally without wheezes or crackles Cardiovascular: Regular rate  and rhythm without murmur gallop or rub normal S1 and S2 Abdomen: negative abdominal pain, nondistended, positive soft, bowel sounds, no rebound, no ascites, no appreciable mass Extremities: LEFT SHOULDER EXAM; positive Hawkins test, positive external rotation test, positive Neer test, positive painful arc test.  Patient with improved movement after  injection on 5/1  Discharge Instructions   Allergies as of 09/25/2019      Reactions   Oxycodone    Tramadol       Medication List    STOP taking these medications   furosemide 40 MG tablet Commonly known as: LASIX   lisinopril 20 MG tablet Commonly known as: ZESTRIL   metoprolol tartrate 25 MG tablet Commonly known as: LOPRESSOR   terazosin 10 MG capsule Commonly known as: HYTRIN   warfarin 5 MG tablet Commonly known as: COUMADIN     TAKE these medications   acetaminophen 325 MG tablet Commonly known as: TYLENOL Take 2 tablets (650 mg total) by mouth every 6 (six) hours as needed for mild pain (or Fever >/= 101).   aspirin 325 MG tablet Take 1 tablet (325 mg total) by mouth daily.   cyanocobalamin 50 MCG tablet Take 1 tablet (50 mcg total) by mouth daily. Start taking on: Sep 26, 2019   diclofenac Sodium 1 % Gel Commonly known as: VOLTAREN Apply 2 g topically 4 (four) times daily.   finasteride 5 MG tablet Commonly known as: PROSCAR Take 5 mg by mouth every evening.   folic acid 1 MG tablet Commonly known as: FOLVITE Take 1 tablet (1 mg total) by mouth daily. Start taking on: Sep 26, 2019   methocarbamol 500 MG tablet Commonly known as: ROBAXIN Take 1 tablet (500 mg total) by mouth every 8 (eight) hours as needed for muscle spasms.   polyethylene glycol 17 g packet Commonly known as: MIRALAX / GLYCOLAX Take 17 g by mouth daily. Start taking on: Sep 26, 2019   PROBIOTIC DAILY PO Take 1 capsule by mouth daily.   sennosides-docusate sodium 8.6-50 MG tablet Commonly known as: SENOKOT-S Take 1 tablet by mouth at bedtime as needed for constipation.   simvastatin 40 MG tablet Commonly known as: ZOCOR Take 20 mg by mouth at bedtime.   tamsulosin 0.4 MG Caps capsule Commonly known as: FLOMAX Take 1 capsule (0.4 mg total) by mouth daily. Start taking on: Sep 26, 2019   traMADol 50 MG tablet Commonly known as: ULTRAM Take 1 tablet (50 mg total) by  mouth 2 (two) times daily.      Allergies  Allergen Reactions  . Oxycodone   . Tramadol     Contact information for follow-up providers    Marchia Bond, MD Follow up.   Specialty: Orthopedic Surgery Why: Can call and make an appointment for follow up on rotator cuff tear as needed Contact information: [address] [place] [address] [phone]        Altamese [place], MD. Schedule an appointment as soon as possible for a visit in 2 week(s).   Specialty: Orthopedic Surgery Contact information: [place] [address] [phone]            Contact information for after-discharge care    Destination    [facility] Preferred SNF .   Service: Skilled Nursing Contact information: [address] [place] [phone]                   The results of significant  diagnostics from this hospitalization (including imaging, microbiology, ancillary and laboratory) are listed below for reference.    Significant Diagnostic Studies: DG Chest 2 View  Result Date: 09/19/2019 CLINICAL DATA:  Hypoxia EXAM: CHEST - 2 VIEW COMPARISON:  Sep 26, 2017 FINDINGS: There is marked cardiomegaly as on the prior exam. Aortic knob calcifications. Diffusely increased interstitial markings with reticulonodular opacities throughout both lungs which are grossly unchanged from the prior exam. There is elevation of the right hemidiaphragm. No large airspace consolidation or pleural effusion. No acute osseous abnormality. IMPRESSION: No significant change in the diffuse chronic interstitial lung changes. Moderate cardiomegaly. Electronically Signed   By: Prudencio Pair M.D.   On: 09/19/2019 06:36   CT Head Wo Contrast  Result Date: 09/19/2019 CLINICAL DATA:  Head trauma EXAM: CT HEAD WITHOUT CONTRAST TECHNIQUE: Contiguous axial images were obtained from the base of the skull through the vertex without intravenous contrast.  COMPARISON:  Sep 26, 2017 FINDINGS: Brain: No evidence of acute territorial infarction, hemorrhage, hydrocephalus,extra-axial collection or mass lesion/mass effect. There is dilatation the ventricles and sulci consistent with age-related atrophy. Low-attenuation changes in the deep white matter consistent with small vessel ischemia. Vascular: No hyperdense vessel or unexpected calcification. Calcifications are seen within the internal carotid artery. Skull: The skull is intact. No fracture or focal lesion identified. Sinuses/Orbits: Sphenoid air cell mucosal thickening and retention cysts are seen. The orbits and globes intact. Other: None IMPRESSION: No acute intracranial abnormality. Findings consistent with age related atrophy and chronic small vessel ischemia Electronically Signed   By: Prudencio Pair M.D.   On: 09/19/2019 06:50   CT PELVIS WO CONTRAST  Result Date: 09/19/2019 CLINICAL DATA:  Fall at home with left hip pain. EXAM: CT PELVIS WITHOUT CONTRAST TECHNIQUE: Multidetector CT imaging of the pelvis was performed following the standard protocol without intravenous contrast. COMPARISON:  Radiography from earlier today FINDINGS: Urinary Tract: Full urinary bladder. ~6 cm high-density mass at the right flank where there was a cyst seen previously. The finding is incompletely covered. Bowel:  No acute finding Vascular/Lymphatic: Diffuse atherosclerotic calcification. Remote aorto bi-iliac stenting. Reproductive:  Enlarged bladder up lifting the bladder base. Other:  Fatty bilateral inguinal and umbilical hernias. Musculoskeletal: Branching fracture at the level of the left acetabulum extending from the puboacetabular junction posteriorly and superiorly along the ilium. On coronal reformats there is a distinct fracture through the roof the lateral superior left acetabulum. No displacement along the articular surface. Nondisplaced fracture through the left inferior pubic ramus best seen on reformats. Both  proximal femora are located and intact. No visible sacral insufficiency fracture. Advanced lumbar spine degeneration. IMPRESSION: 1. Nondisplaced fracture through the left obturator ring and acetabulum. 2. Mild extraperitoneal hemorrhage in the left pelvis. 3. 6 cm partially covered high-density mass at the right flank where there was a renal cyst seen in 2012. This presumably reflects intracystic hemorrhage, but is incompletely visualized and solid mass is not excluded. Enhanced CT or renal ultrasound could be attempted if appropriate for comorbidities. 4. Additional incidental findings noted above. Electronically Signed   By: Monte Fantasia M.D.   On: 09/19/2019 07:49   MR SHOULDER LEFT WO CONTRAST  Result Date: 09/22/2019 CLINICAL DATA:  Rotator cuff tear suspected EXAM: MRI OF THE LEFT SHOULDER WITHOUT CONTRAST TECHNIQUE: Multiplanar, multisequence MR imaging of the shoulder was performed. No intravenous contrast was administered. COMPARISON:  None. FINDINGS: Rotator cuff: There is chronic complete full-thickness tears of the supraspinatus and infraspinatus tendons with approximately 3.3 cm in  tendon retraction. There is also a focal full-thickness tear of the superior subscapularis tendon with approximately 2 cm of tendon retraction. The teres minor tendon appears to be intact. There is mild fatty atrophy of the supraspinatus and infraspinatus muscle bellies. There is diffuse muscle belly edema seen throughout the infraspinatus, teres minor, and subscapularis. A high-riding humeral head and resultant fluid in the subacromial-subdeltoid bursa is seen. Muscles:  There is edema seen within the deltoid musculature. Biceps Long Head: There is medial subluxation of the long head of the biceps tendon from the bicipital groove with increased intrasubstance signal and a interstitial partial tear. There is a multilocular 1.2 cm probable ganglion cyst within the bicipital groove. Acromioclavicular Joint: Moderate to  advanced AC joint arthrosis is seen with joint space loss and subchondral cystic changes. Type II acromion. Glenohumeral Joint: There is a high-riding humeral head. Moderate AC joint arthrosis is seen with joint space loss, chondral thinning and subchondral cystic changes. No joint effusion. Labrum: There is diffuse globular signal and thickening seen throughout the labrum. A probable nondisplaced tear seen through the posteroinferior labrum. Bones: No fracture, osteonecrosis, or pathologic marrow infiltration. Other: There is fluid in the subacromial-subdeltoid bursa. IMPRESSION: 1. Complete full-thickness tears of the supraspinatus and infraspinatus tendons with approximately 3.3 cm of tendon retraction, resultant high-riding humeral head and fluid in the subacromial-subdeltoid bursa. 2. There is mild muscular atrophy of the supraspinatus and infraspinatus muscle bellies with diffuse muscular edema seen surrounding the shoulder. 3. Focal full-thickness tear of the superior subscapularis tendon with approximately 2 cm of tendon retraction. 4. Medial subluxation of the long head of the biceps tendon with a partial interstitial tear and a multilocular 1.2 cm ganglion cyst within the bicipital groove. 5. Moderate glenohumeral joint osteoarthritis with diffuse labral degeneration and probable nondisplaced tear of the posteroinferior labrum. 6. Moderate to advanced AC joint arthrosis. Electronically Signed   By: Prudencio Pair M.D.   On: 09/22/2019 19:49   DG Shoulder Left  Result Date: 09/19/2019 CLINICAL DATA:  Fall with left shoulder pain. EXAM: LEFT SHOULDER - 2+ VIEW COMPARISON:  None. FINDINGS: Mild degenerate changes of the [facility] joint and glenohumeral joints. No evidence of acute fracture or dislocation. IMPRESSION: No acute findings. Electronically Signed   By: Marin Olp M.D.   On: 09/19/2019 09:28   DG Hip Unilat  With Pelvis 2-3 Views Right  Result Date: 09/19/2019 CLINICAL DATA:  Fall. EXAM: DG HIP  (WITH OR WITHOUT PELVIS) 2-3V RIGHT COMPARISON:  None. FINDINGS: Generalized osteopenia. No dislocation or visible hip fracture. Aortic iliac stenting. IMPRESSION: No evidence of fracture. Electronically Signed   By: Monte Fantasia M.D.   On: 09/19/2019 06:32    Microbiology: Recent Results (from the past 240 hour(s))  Respiratory Panel by RT PCR (Flu A&B, Covid) - Nasopharyngeal Swab     Status: None   Collection Time: 09/19/19 11:35 AM   Specimen: Nasopharyngeal Swab  Result Value Ref Range Status   SARS Coronavirus 2 by RT PCR NEGATIVE NEGATIVE Final    Comment: (NOTE) SARS-CoV-2 target nucleic acids are NOT DETECTED. The SARS-CoV-2 RNA is generally detectable in upper respiratoy specimens during the acute phase of infection. The lowest concentration of SARS-CoV-2 viral copies this assay can detect is 131 copies/mL. A negative result does not preclude SARS-Cov-2 infection and should not be used as the sole basis for treatment or other patient management decisions. A negative result may occur with  improper specimen collection/handling, submission of specimen other than nasopharyngeal  swab, presence of viral mutation(s) within the areas targeted by this assay, and inadequate number of viral copies (<131 copies/mL). A negative result must be combined with clinical observations, patient history, and epidemiological information. The expected result is Negative. Fact Sheet for Patients:  PinkCheek.be Fact Sheet for Healthcare Providers:  GravelBags.it This test is not yet ap proved or cleared by the [place] FDA and  has been authorized for detection and/or diagnosis of SARS-CoV-2 by FDA under an Emergency Use Authorization (EUA). This EUA will remain  in effect (meaning this test can be used) for the duration of the COVID-19 declaration under Section 564(b)(1) of the Act, 21 U.S.C. section 360bbb-3(b)(1), unless the  authorization is terminated or revoked sooner.    Influenza A by PCR NEGATIVE NEGATIVE Final   Influenza B by PCR NEGATIVE NEGATIVE Final    Comment: (NOTE) The Xpert Xpress SARS-CoV-2/FLU/RSV assay is intended as an aid in  the diagnosis of influenza from Nasopharyngeal swab specimens and  should not be used as a sole basis for treatment. Nasal washings and  aspirates are unacceptable for Xpert Xpress SARS-CoV-2/FLU/RSV  testing. Fact Sheet for Patients: PinkCheek.be Fact Sheet for Healthcare Providers: GravelBags.it This test is not yet approved or cleared by the [place] FDA and  has been authorized for detection and/or diagnosis of SARS-CoV-2 by  FDA under an Emergency Use Authorization (EUA). This EUA will remain  in effect (meaning this test can be used) for the duration of the  Covid-19 declaration under Section 564(b)(1) of the Act, 21  U.S.C. section 360bbb-3(b)(1), unless the authorization is  terminated or revoked. Performed at [facility], [place] [address] [place], [place] [address]   SARS CORONAVIRUS 2 (TAT 6-24 HRS) Nasopharyngeal Nasopharyngeal Swab     Status: None   Collection Time: 09/23/19  2:30 PM   Specimen: Nasopharyngeal Swab  Result Value Ref Range Status   SARS Coronavirus 2 NEGATIVE NEGATIVE Final    Comment: (NOTE) SARS-CoV-2 target nucleic acids are NOT DETECTED. The SARS-CoV-2 RNA is generally detectable in upper and lower respiratory specimens during the acute phase of infection. Negative results do not preclude SARS-CoV-2 infection, do not rule out co-infections with other pathogens, and should not be used as the sole basis for treatment or other patient management decisions. Negative results must be combined with clinical observations, patient history, and epidemiological information. The expected result is Negative. Fact Sheet for  Patients: SugarRoll.be Fact Sheet for Healthcare Providers: https://www.-mathews.com/ This test is not yet approved or cleared by the [place] FDA and  has been authorized for detection and/or diagnosis of SARS-CoV-2 by FDA under an Emergency Use Authorization (EUA). This EUA will remain  in effect (meaning this test can be used) for the duration of the COVID-19 declaration under Section 56 4(b)(1) of the Act, 21 U.S.C. section 360bbb-3(b)(1), unless the authorization is terminated or revoked sooner. Performed at [facility], [place] [address], [place], [place] [address]      Labs: Basic Metabolic Panel: Recent Labs  Lab 09/21/19 0340 09/22/19 0504 09/23/19 0433 09/24/19 0602 09/25/19 0614  NA 138 135 136 138 140  K 4.4 4.6 4.7 4.9 4.7  CL 106 103 103 105 107  CO2 26 26 26 25 26   GLUCOSE 100* 95 109* 120* 108*  BUN 37* 42* 47* 49* 45*  CREATININE 1.59* 1.74* 1.55* 1.55* 1.34*  CALCIUM 8.6* 8.4* 8.7* 9.0 9.1  MG 2.3 2.3 2.4 2.5* 2.5*  PHOS 3.0 3.1 3.2  3.0 2.9   Liver Function Tests: Recent Labs  Lab 09/21/19 0340 09/22/19 0504 09/23/19 0433 09/24/19 0602 09/25/19 0614  AST 15 17 16 17  14*  ALT 14 13 14 14 13   ALKPHOS 46 45 48 53 50  BILITOT 1.1 0.9 1.2 1.3* 1.1  PROT 5.3* 5.2* 5.6* 6.1* 5.7*  ALBUMIN 2.8* 2.7* 2.8* 2.9* 3.0*   No results for input(s): LIPASE, AMYLASE in the last 168 hours. No results for input(s): AMMONIA in the last 168 hours. CBC: Recent Labs  Lab 09/19/19 0606 09/20/19 0553 09/21/19 0340 09/22/19 0504 09/23/19 0433 09/24/19 0602 09/25/19 0614  WBC 8.2   < > 5.9 5.0 5.5 5.3 5.6  NEUTROABS 7.1  --   --   --   --   --   --   HGB 9.6*   < > 7.8* 7.8* 8.3* 8.5* 8.4*  HCT 32.1*   < > 26.2* 25.4* 26.3* 27.4* 27.8*  MCV 95.8   < > 94.9 94.8 92.9 92.3 95.5  PLT 114*   < > 100* 105* 122* 142* 188   < > = values in this interval not displayed.   Cardiac Enzymes: No results for  input(s): CKTOTAL, CKMB, CKMBINDEX, TROPONINI in the last 168 hours. BNP: BNP (last 3 results) Recent Labs    09/19/19 0606  BNP 153.8*    ProBNP (last 3 results) No results for input(s): PROBNP in the last 8760 hours.  CBG: No results for input(s): GLUCAP in the last 168 hours.     Signed:  Dia Crawford, MD [facility] [phone] pager

## 2019-09-26 ENCOUNTER — Encounter: Payer: Self-pay | Admitting: Podiatry

## 2019-09-26 DIAGNOSIS — S32502D Unspecified fracture of left pubis, subsequent encounter for fracture with routine healing: Secondary | ICD-10-CM | POA: Diagnosis not present

## 2019-09-26 DIAGNOSIS — M25552 Pain in left hip: Secondary | ICD-10-CM | POA: Diagnosis not present

## 2019-09-26 DIAGNOSIS — M6281 Muscle weakness (generalized): Secondary | ICD-10-CM | POA: Diagnosis not present

## 2019-09-26 DIAGNOSIS — M62838 Other muscle spasm: Secondary | ICD-10-CM | POA: Diagnosis not present

## 2019-09-26 DIAGNOSIS — S32402D Unspecified fracture of left acetabulum, subsequent encounter for fracture with routine healing: Secondary | ICD-10-CM | POA: Diagnosis not present

## 2019-09-26 DIAGNOSIS — R5381 Other malaise: Secondary | ICD-10-CM | POA: Diagnosis not present

## 2019-09-28 DIAGNOSIS — M25552 Pain in left hip: Secondary | ICD-10-CM | POA: Diagnosis not present

## 2019-09-28 DIAGNOSIS — M6281 Muscle weakness (generalized): Secondary | ICD-10-CM | POA: Diagnosis not present

## 2019-09-28 DIAGNOSIS — R262 Difficulty in walking, not elsewhere classified: Secondary | ICD-10-CM | POA: Diagnosis not present

## 2019-09-28 DIAGNOSIS — M25512 Pain in left shoulder: Secondary | ICD-10-CM | POA: Diagnosis not present

## 2019-09-29 DIAGNOSIS — D649 Anemia, unspecified: Secondary | ICD-10-CM | POA: Diagnosis not present

## 2019-09-29 DIAGNOSIS — S72002D Fracture of unspecified part of neck of left femur, subsequent encounter for closed fracture with routine healing: Secondary | ICD-10-CM | POA: Diagnosis not present

## 2019-09-29 DIAGNOSIS — I4821 Permanent atrial fibrillation: Secondary | ICD-10-CM | POA: Diagnosis not present

## 2019-09-29 DIAGNOSIS — M25512 Pain in left shoulder: Secondary | ICD-10-CM | POA: Diagnosis not present

## 2019-10-03 DIAGNOSIS — M25552 Pain in left hip: Secondary | ICD-10-CM | POA: Diagnosis not present

## 2019-10-03 DIAGNOSIS — M6281 Muscle weakness (generalized): Secondary | ICD-10-CM | POA: Diagnosis not present

## 2019-10-03 DIAGNOSIS — R262 Difficulty in walking, not elsewhere classified: Secondary | ICD-10-CM | POA: Diagnosis not present

## 2019-10-03 DIAGNOSIS — M25512 Pain in left shoulder: Secondary | ICD-10-CM | POA: Diagnosis not present

## 2019-10-04 DIAGNOSIS — M6281 Muscle weakness (generalized): Secondary | ICD-10-CM | POA: Diagnosis not present

## 2019-10-04 DIAGNOSIS — M62838 Other muscle spasm: Secondary | ICD-10-CM | POA: Diagnosis not present

## 2019-10-04 DIAGNOSIS — M25552 Pain in left hip: Secondary | ICD-10-CM | POA: Diagnosis not present

## 2019-10-04 DIAGNOSIS — S32502D Unspecified fracture of left pubis, subsequent encounter for fracture with routine healing: Secondary | ICD-10-CM | POA: Diagnosis not present

## 2019-10-04 DIAGNOSIS — D539 Nutritional anemia, unspecified: Secondary | ICD-10-CM | POA: Diagnosis not present

## 2019-10-04 DIAGNOSIS — S32402D Unspecified fracture of left acetabulum, subsequent encounter for fracture with routine healing: Secondary | ICD-10-CM | POA: Diagnosis not present

## 2019-10-04 DIAGNOSIS — R5381 Other malaise: Secondary | ICD-10-CM | POA: Diagnosis not present

## 2019-10-05 DIAGNOSIS — G8929 Other chronic pain: Secondary | ICD-10-CM | POA: Diagnosis not present

## 2019-10-05 DIAGNOSIS — R5381 Other malaise: Secondary | ICD-10-CM | POA: Diagnosis not present

## 2019-10-05 DIAGNOSIS — R5383 Other fatigue: Secondary | ICD-10-CM | POA: Diagnosis not present

## 2019-10-05 DIAGNOSIS — I10 Essential (primary) hypertension: Secondary | ICD-10-CM | POA: Diagnosis not present

## 2019-10-05 DIAGNOSIS — M62838 Other muscle spasm: Secondary | ICD-10-CM | POA: Diagnosis not present

## 2019-10-06 DIAGNOSIS — G8929 Other chronic pain: Secondary | ICD-10-CM | POA: Diagnosis not present

## 2019-10-06 DIAGNOSIS — M6281 Muscle weakness (generalized): Secondary | ICD-10-CM | POA: Diagnosis not present

## 2019-10-06 DIAGNOSIS — K5900 Constipation, unspecified: Secondary | ICD-10-CM | POA: Diagnosis not present

## 2019-10-06 DIAGNOSIS — R5381 Other malaise: Secondary | ICD-10-CM | POA: Diagnosis not present

## 2019-10-12 DIAGNOSIS — E860 Dehydration: Secondary | ICD-10-CM | POA: Diagnosis not present

## 2019-10-12 DIAGNOSIS — N179 Acute kidney failure, unspecified: Secondary | ICD-10-CM | POA: Diagnosis not present

## 2019-10-12 DIAGNOSIS — J449 Chronic obstructive pulmonary disease, unspecified: Secondary | ICD-10-CM | POA: Diagnosis not present

## 2019-10-12 DIAGNOSIS — I5032 Chronic diastolic (congestive) heart failure: Secondary | ICD-10-CM | POA: Diagnosis not present

## 2019-10-12 DIAGNOSIS — R0602 Shortness of breath: Secondary | ICD-10-CM | POA: Diagnosis not present

## 2019-10-13 DIAGNOSIS — I5032 Chronic diastolic (congestive) heart failure: Secondary | ICD-10-CM | POA: Diagnosis not present

## 2019-10-13 DIAGNOSIS — R5383 Other fatigue: Secondary | ICD-10-CM | POA: Diagnosis not present

## 2019-10-13 DIAGNOSIS — N179 Acute kidney failure, unspecified: Secondary | ICD-10-CM | POA: Diagnosis not present

## 2019-10-13 DIAGNOSIS — D539 Nutritional anemia, unspecified: Secondary | ICD-10-CM | POA: Diagnosis not present

## 2019-10-17 DIAGNOSIS — R262 Difficulty in walking, not elsewhere classified: Secondary | ICD-10-CM | POA: Diagnosis not present

## 2019-10-17 DIAGNOSIS — M25512 Pain in left shoulder: Secondary | ICD-10-CM | POA: Diagnosis not present

## 2019-10-17 DIAGNOSIS — J449 Chronic obstructive pulmonary disease, unspecified: Secondary | ICD-10-CM | POA: Diagnosis not present

## 2019-10-17 DIAGNOSIS — M25552 Pain in left hip: Secondary | ICD-10-CM | POA: Diagnosis not present

## 2019-10-17 DIAGNOSIS — R0602 Shortness of breath: Secondary | ICD-10-CM | POA: Diagnosis not present

## 2019-10-17 DIAGNOSIS — S32402D Unspecified fracture of left acetabulum, subsequent encounter for fracture with routine healing: Secondary | ICD-10-CM | POA: Diagnosis not present

## 2019-10-17 DIAGNOSIS — R5383 Other fatigue: Secondary | ICD-10-CM | POA: Diagnosis not present

## 2019-10-17 DIAGNOSIS — S32502D Unspecified fracture of left pubis, subsequent encounter for fracture with routine healing: Secondary | ICD-10-CM | POA: Diagnosis not present

## 2019-10-17 DIAGNOSIS — M6281 Muscle weakness (generalized): Secondary | ICD-10-CM | POA: Diagnosis not present

## 2019-10-17 DIAGNOSIS — I5032 Chronic diastolic (congestive) heart failure: Secondary | ICD-10-CM | POA: Diagnosis not present

## 2019-10-19 DIAGNOSIS — S32502D Unspecified fracture of left pubis, subsequent encounter for fracture with routine healing: Secondary | ICD-10-CM | POA: Diagnosis not present

## 2019-10-20 DIAGNOSIS — M6281 Muscle weakness (generalized): Secondary | ICD-10-CM | POA: Diagnosis not present

## 2019-10-20 DIAGNOSIS — J449 Chronic obstructive pulmonary disease, unspecified: Secondary | ICD-10-CM | POA: Diagnosis not present

## 2019-10-20 DIAGNOSIS — R2681 Unsteadiness on feet: Secondary | ICD-10-CM | POA: Diagnosis not present

## 2019-10-23 DIAGNOSIS — M6281 Muscle weakness (generalized): Secondary | ICD-10-CM | POA: Diagnosis not present

## 2019-10-23 DIAGNOSIS — R2681 Unsteadiness on feet: Secondary | ICD-10-CM | POA: Diagnosis not present

## 2019-10-23 DIAGNOSIS — J449 Chronic obstructive pulmonary disease, unspecified: Secondary | ICD-10-CM | POA: Diagnosis not present

## 2019-10-24 ENCOUNTER — Other Ambulatory Visit: Payer: Self-pay

## 2019-10-24 DIAGNOSIS — J449 Chronic obstructive pulmonary disease, unspecified: Secondary | ICD-10-CM | POA: Diagnosis not present

## 2019-10-24 DIAGNOSIS — M6281 Muscle weakness (generalized): Secondary | ICD-10-CM | POA: Diagnosis not present

## 2019-10-24 DIAGNOSIS — R4189 Other symptoms and signs involving cognitive functions and awareness: Secondary | ICD-10-CM | POA: Diagnosis not present

## 2019-10-24 DIAGNOSIS — R2681 Unsteadiness on feet: Secondary | ICD-10-CM | POA: Diagnosis not present

## 2019-10-24 DIAGNOSIS — S32402D Unspecified fracture of left acetabulum, subsequent encounter for fracture with routine healing: Secondary | ICD-10-CM | POA: Diagnosis not present

## 2019-10-24 DIAGNOSIS — R1312 Dysphagia, oropharyngeal phase: Secondary | ICD-10-CM | POA: Diagnosis not present

## 2019-10-24 NOTE — Patient Outreach (Signed)
[facility]) Care Management  10/24/2019  Meilech Luchsinger [birth year] PX:[id number]   Referral Date: 10/24/19 Referral Source: [employer] Report Date of Discharge:10/20/19 Facility: [place]: [facility]  Referral received.  No outreach warranted at this time.  Transition of Care calls being completed via EMMI. RN CM will outreach patient for any red flags received.    Plan: RN CM will close case.     Jone Baseman, RN, MSN [facility] Care Management Care Management Coordinator Direct Line [phone] Toll Free: [phone]  Fax: [phone]

## 2019-10-25 DIAGNOSIS — R2681 Unsteadiness on feet: Secondary | ICD-10-CM | POA: Diagnosis not present

## 2019-10-25 DIAGNOSIS — R4189 Other symptoms and signs involving cognitive functions and awareness: Secondary | ICD-10-CM | POA: Diagnosis not present

## 2019-10-25 DIAGNOSIS — J449 Chronic obstructive pulmonary disease, unspecified: Secondary | ICD-10-CM | POA: Diagnosis not present

## 2019-10-25 DIAGNOSIS — M6281 Muscle weakness (generalized): Secondary | ICD-10-CM | POA: Diagnosis not present

## 2019-10-25 DIAGNOSIS — R1312 Dysphagia, oropharyngeal phase: Secondary | ICD-10-CM | POA: Diagnosis not present

## 2019-10-26 DIAGNOSIS — J449 Chronic obstructive pulmonary disease, unspecified: Secondary | ICD-10-CM | POA: Diagnosis not present

## 2019-10-27 DIAGNOSIS — I482 Chronic atrial fibrillation, unspecified: Secondary | ICD-10-CM | POA: Diagnosis not present

## 2019-10-28 DIAGNOSIS — R531 Weakness: Secondary | ICD-10-CM | POA: Diagnosis not present

## 2019-10-30 DIAGNOSIS — R1312 Dysphagia, oropharyngeal phase: Secondary | ICD-10-CM | POA: Diagnosis not present

## 2019-10-30 DIAGNOSIS — I110 Hypertensive heart disease with heart failure: Secondary | ICD-10-CM | POA: Diagnosis not present

## 2019-10-30 DIAGNOSIS — I5032 Chronic diastolic (congestive) heart failure: Secondary | ICD-10-CM | POA: Diagnosis not present

## 2019-10-30 DIAGNOSIS — D696 Thrombocytopenia, unspecified: Secondary | ICD-10-CM | POA: Diagnosis not present

## 2019-10-31 DIAGNOSIS — I110 Hypertensive heart disease with heart failure: Secondary | ICD-10-CM | POA: Diagnosis not present

## 2019-11-01 ENCOUNTER — Ambulatory Visit: Payer: Medicare HMO | Admitting: Podiatry

## 2019-11-01 DIAGNOSIS — M6281 Muscle weakness (generalized): Secondary | ICD-10-CM | POA: Diagnosis not present

## 2019-11-01 DIAGNOSIS — I5032 Chronic diastolic (congestive) heart failure: Secondary | ICD-10-CM | POA: Diagnosis not present

## 2019-11-03 DIAGNOSIS — R1312 Dysphagia, oropharyngeal phase: Secondary | ICD-10-CM | POA: Diagnosis not present

## 2019-11-06 DIAGNOSIS — R52 Pain, unspecified: Secondary | ICD-10-CM | POA: Diagnosis not present

## 2019-11-08 DIAGNOSIS — R5383 Other fatigue: Secondary | ICD-10-CM | POA: Diagnosis not present

## 2019-11-10 DIAGNOSIS — R2681 Unsteadiness on feet: Secondary | ICD-10-CM | POA: Diagnosis not present

## 2019-11-14 DIAGNOSIS — R339 Retention of urine, unspecified: Secondary | ICD-10-CM | POA: Diagnosis not present

## 2019-11-15 DIAGNOSIS — I5032 Chronic diastolic (congestive) heart failure: Secondary | ICD-10-CM | POA: Diagnosis not present

## 2019-11-16 DIAGNOSIS — R198 Other specified symptoms and signs involving the digestive system and abdomen: Secondary | ICD-10-CM | POA: Diagnosis not present

## 2019-11-17 DIAGNOSIS — M62838 Other muscle spasm: Secondary | ICD-10-CM | POA: Diagnosis not present

## 2019-11-20 DIAGNOSIS — L89629 Pressure ulcer of left heel, unspecified stage: Secondary | ICD-10-CM | POA: Diagnosis not present

## 2019-11-20 DIAGNOSIS — L603 Nail dystrophy: Secondary | ICD-10-CM | POA: Diagnosis not present

## 2019-11-20 DIAGNOSIS — R6889 Other general symptoms and signs: Secondary | ICD-10-CM | POA: Diagnosis not present

## 2019-11-20 DIAGNOSIS — L851 Acquired keratosis [keratoderma] palmaris et plantaris: Secondary | ICD-10-CM | POA: Diagnosis not present

## 2019-11-20 DIAGNOSIS — M79671 Pain in right foot: Secondary | ICD-10-CM | POA: Diagnosis not present

## 2019-11-23 DIAGNOSIS — N401 Enlarged prostate with lower urinary tract symptoms: Secondary | ICD-10-CM | POA: Diagnosis not present

## 2019-11-30 DIAGNOSIS — K5900 Constipation, unspecified: Secondary | ICD-10-CM | POA: Diagnosis not present

## 2019-12-01 DIAGNOSIS — D696 Thrombocytopenia, unspecified: Secondary | ICD-10-CM | POA: Diagnosis not present

## 2019-12-04 DIAGNOSIS — E569 Vitamin deficiency, unspecified: Secondary | ICD-10-CM | POA: Diagnosis not present

## 2019-12-07 DIAGNOSIS — R102 Pelvic and perineal pain: Secondary | ICD-10-CM | POA: Diagnosis not present

## 2019-12-07 DIAGNOSIS — E785 Hyperlipidemia, unspecified: Secondary | ICD-10-CM | POA: Diagnosis not present

## 2019-12-11 DIAGNOSIS — S32502D Unspecified fracture of left pubis, subsequent encounter for fracture with routine healing: Secondary | ICD-10-CM | POA: Diagnosis not present

## 2019-12-11 DIAGNOSIS — M6281 Muscle weakness (generalized): Secondary | ICD-10-CM | POA: Diagnosis not present

## 2019-12-11 DIAGNOSIS — J449 Chronic obstructive pulmonary disease, unspecified: Secondary | ICD-10-CM | POA: Diagnosis not present

## 2019-12-11 DIAGNOSIS — R1312 Dysphagia, oropharyngeal phase: Secondary | ICD-10-CM | POA: Diagnosis not present

## 2019-12-11 DIAGNOSIS — R4189 Other symptoms and signs involving cognitive functions and awareness: Secondary | ICD-10-CM | POA: Diagnosis not present

## 2019-12-11 DIAGNOSIS — R2681 Unsteadiness on feet: Secondary | ICD-10-CM | POA: Diagnosis not present

## 2019-12-14 DIAGNOSIS — R4189 Other symptoms and signs involving cognitive functions and awareness: Secondary | ICD-10-CM | POA: Diagnosis not present

## 2019-12-14 DIAGNOSIS — J449 Chronic obstructive pulmonary disease, unspecified: Secondary | ICD-10-CM | POA: Diagnosis not present

## 2019-12-14 DIAGNOSIS — S32402D Unspecified fracture of left acetabulum, subsequent encounter for fracture with routine healing: Secondary | ICD-10-CM | POA: Diagnosis not present

## 2019-12-14 DIAGNOSIS — R1312 Dysphagia, oropharyngeal phase: Secondary | ICD-10-CM | POA: Diagnosis not present

## 2019-12-14 DIAGNOSIS — R2681 Unsteadiness on feet: Secondary | ICD-10-CM | POA: Diagnosis not present

## 2019-12-14 DIAGNOSIS — Z7189 Other specified counseling: Secondary | ICD-10-CM | POA: Diagnosis not present

## 2019-12-14 DIAGNOSIS — M6281 Muscle weakness (generalized): Secondary | ICD-10-CM | POA: Diagnosis not present

## 2019-12-16 DIAGNOSIS — R2681 Unsteadiness on feet: Secondary | ICD-10-CM | POA: Diagnosis not present

## 2019-12-16 DIAGNOSIS — R4189 Other symptoms and signs involving cognitive functions and awareness: Secondary | ICD-10-CM | POA: Diagnosis not present

## 2019-12-16 DIAGNOSIS — J449 Chronic obstructive pulmonary disease, unspecified: Secondary | ICD-10-CM | POA: Diagnosis not present

## 2019-12-16 DIAGNOSIS — M6281 Muscle weakness (generalized): Secondary | ICD-10-CM | POA: Diagnosis not present

## 2019-12-16 DIAGNOSIS — R1312 Dysphagia, oropharyngeal phase: Secondary | ICD-10-CM | POA: Diagnosis not present

## 2019-12-18 DIAGNOSIS — M6281 Muscle weakness (generalized): Secondary | ICD-10-CM | POA: Diagnosis not present

## 2019-12-18 DIAGNOSIS — R4189 Other symptoms and signs involving cognitive functions and awareness: Secondary | ICD-10-CM | POA: Diagnosis not present

## 2019-12-18 DIAGNOSIS — R2681 Unsteadiness on feet: Secondary | ICD-10-CM | POA: Diagnosis not present

## 2019-12-18 DIAGNOSIS — J449 Chronic obstructive pulmonary disease, unspecified: Secondary | ICD-10-CM | POA: Diagnosis not present

## 2019-12-18 DIAGNOSIS — R1312 Dysphagia, oropharyngeal phase: Secondary | ICD-10-CM | POA: Diagnosis not present

## 2019-12-20 DIAGNOSIS — R4189 Other symptoms and signs involving cognitive functions and awareness: Secondary | ICD-10-CM | POA: Diagnosis not present

## 2019-12-20 DIAGNOSIS — J449 Chronic obstructive pulmonary disease, unspecified: Secondary | ICD-10-CM | POA: Diagnosis not present

## 2019-12-20 DIAGNOSIS — R2681 Unsteadiness on feet: Secondary | ICD-10-CM | POA: Diagnosis not present

## 2019-12-20 DIAGNOSIS — R1312 Dysphagia, oropharyngeal phase: Secondary | ICD-10-CM | POA: Diagnosis not present

## 2019-12-20 DIAGNOSIS — M6281 Muscle weakness (generalized): Secondary | ICD-10-CM | POA: Diagnosis not present

## 2019-12-22 DIAGNOSIS — I509 Heart failure, unspecified: Secondary | ICD-10-CM | POA: Diagnosis not present

## 2019-12-22 DIAGNOSIS — I110 Hypertensive heart disease with heart failure: Secondary | ICD-10-CM | POA: Diagnosis not present

## 2019-12-23 DIAGNOSIS — R1312 Dysphagia, oropharyngeal phase: Secondary | ICD-10-CM | POA: Diagnosis not present

## 2019-12-23 DIAGNOSIS — R4189 Other symptoms and signs involving cognitive functions and awareness: Secondary | ICD-10-CM | POA: Diagnosis not present

## 2019-12-23 DIAGNOSIS — M6281 Muscle weakness (generalized): Secondary | ICD-10-CM | POA: Diagnosis not present

## 2019-12-23 DIAGNOSIS — R2681 Unsteadiness on feet: Secondary | ICD-10-CM | POA: Diagnosis not present

## 2019-12-23 DIAGNOSIS — J449 Chronic obstructive pulmonary disease, unspecified: Secondary | ICD-10-CM | POA: Diagnosis not present

## 2020-02-23 DEATH — deceased
